# Patient Record
Sex: Female | Born: 1935
Health system: Southern US, Community
[De-identification: ages and names within clinical notes are randomized; demographics above are authoritative.]

## PROBLEM LIST (undated history)

## (undated) DIAGNOSIS — D649 Anemia, unspecified: Secondary | ICD-10-CM

## (undated) DIAGNOSIS — N289 Disorder of kidney and ureter, unspecified: Secondary | ICD-10-CM

## (undated) DIAGNOSIS — M1990 Unspecified osteoarthritis, unspecified site: Secondary | ICD-10-CM

## (undated) DIAGNOSIS — E785 Hyperlipidemia, unspecified: Secondary | ICD-10-CM

## (undated) DIAGNOSIS — I10 Essential (primary) hypertension: Secondary | ICD-10-CM

## (undated) DIAGNOSIS — M8580 Other specified disorders of bone density and structure, unspecified site: Secondary | ICD-10-CM

## (undated) HISTORY — DX: Hyperlipidemia, unspecified: E78.5

## (undated) HISTORY — DX: Essential (primary) hypertension: I10

## (undated) HISTORY — DX: Anemia, unspecified: D64.9

## (undated) HISTORY — DX: Disorder of kidney and ureter, unspecified: N28.9

## (undated) HISTORY — PX: CATARACT EXTRACTION: SUR2

## (undated) HISTORY — DX: Other specified disorders of bone density and structure, unspecified site: M85.80

---

## 2002-08-20 ENCOUNTER — Encounter: Payer: Self-pay | Admitting: Internal Medicine

## 2002-08-20 ENCOUNTER — Ambulatory Visit (HOSPITAL_COMMUNITY): Admission: RE | Admit: 2002-08-20 | Discharge: 2002-08-20 | Payer: Self-pay | Admitting: Internal Medicine

## 2003-10-19 ENCOUNTER — Inpatient Hospital Stay (HOSPITAL_COMMUNITY): Admission: RE | Admit: 2003-10-19 | Discharge: 2003-10-23 | Payer: Self-pay | Admitting: Orthopedic Surgery

## 2003-10-19 HISTORY — PX: KNEE SURGERY: SHX244

## 2004-11-09 ENCOUNTER — Ambulatory Visit: Payer: Self-pay | Admitting: Internal Medicine

## 2005-03-21 ENCOUNTER — Ambulatory Visit: Payer: Self-pay | Admitting: Internal Medicine

## 2005-05-24 ENCOUNTER — Ambulatory Visit: Payer: Self-pay | Admitting: Internal Medicine

## 2005-06-29 ENCOUNTER — Ambulatory Visit: Payer: Self-pay | Admitting: Family Medicine

## 2005-06-29 ENCOUNTER — Ambulatory Visit: Payer: Self-pay | Admitting: Internal Medicine

## 2006-04-04 ENCOUNTER — Ambulatory Visit: Payer: Self-pay | Admitting: Internal Medicine

## 2006-09-13 ENCOUNTER — Ambulatory Visit: Payer: Self-pay | Admitting: Internal Medicine

## 2006-10-16 ENCOUNTER — Ambulatory Visit: Payer: Self-pay | Admitting: Internal Medicine

## 2006-10-16 LAB — CONVERTED CEMR LAB
Folate: 16.2 ng/mL
Iron: 98 ug/dL (ref 42–145)
Saturation Ratios: 29.4 % (ref 20.0–50.0)
Transferrin: 238.2 mg/dL (ref >=212.0)
Vitamin B-12: 395 pg/mL (ref 211–911)

## 2007-04-09 ENCOUNTER — Ambulatory Visit: Payer: Self-pay | Admitting: Internal Medicine

## 2007-04-10 ENCOUNTER — Encounter: Admission: RE | Admit: 2007-04-10 | Discharge: 2007-04-10 | Payer: Self-pay | Admitting: Internal Medicine

## 2007-05-09 DIAGNOSIS — I10 Essential (primary) hypertension: Secondary | ICD-10-CM | POA: Insufficient documentation

## 2007-11-21 ENCOUNTER — Ambulatory Visit: Payer: Self-pay | Admitting: Internal Medicine

## 2008-02-13 ENCOUNTER — Encounter: Payer: Self-pay | Admitting: Internal Medicine

## 2008-02-13 ENCOUNTER — Ambulatory Visit: Payer: Self-pay | Admitting: Internal Medicine

## 2008-02-20 ENCOUNTER — Encounter (INDEPENDENT_AMBULATORY_CARE_PROVIDER_SITE_OTHER): Payer: Self-pay | Admitting: *Deleted

## 2008-02-24 ENCOUNTER — Encounter (INDEPENDENT_AMBULATORY_CARE_PROVIDER_SITE_OTHER): Payer: Self-pay | Admitting: *Deleted

## 2008-03-11 ENCOUNTER — Telehealth (INDEPENDENT_AMBULATORY_CARE_PROVIDER_SITE_OTHER): Payer: Self-pay | Admitting: *Deleted

## 2008-04-17 ENCOUNTER — Ambulatory Visit: Payer: Self-pay | Admitting: Internal Medicine

## 2008-04-17 LAB — CONVERTED CEMR LAB
ALT: 26 units/L (ref 0–35)
AST: 31 units/L (ref 0–37)
Albumin: 4 g/dL (ref 3.5–5.2)
Alkaline Phosphatase: 33 units/L — ABNORMAL LOW (ref 39–117)
BUN: 27 mg/dL — ABNORMAL HIGH (ref 6–23)
Basophils Absolute: 0.1 10*3/uL (ref 0.0–0.1)
Basophils Relative: 1.1 % — ABNORMAL HIGH (ref 0.0–1.0)
Bilirubin, Direct: 0.1 mg/dL (ref 0.0–0.3)
Cholesterol: 231 mg/dL (ref 0–200)
Creatinine, Ser: 1.4 mg/dL — ABNORMAL HIGH (ref 0.4–1.2)
Direct LDL: 141.4 mg/dL
Eosinophils Absolute: 0.2 10*3/uL (ref 0.0–0.7)
Eosinophils Relative: 3.2 % (ref 0.0–5.0)
HCT: 34.7 % — ABNORMAL LOW (ref 36.0–46.0)
HDL: 64 mg/dL (ref >39.0)
Hemoglobin: 11.8 g/dL — ABNORMAL LOW (ref 12.0–15.0)
Lymphocytes Relative: 22.5 % (ref 12.0–46.0)
MCHC: 33.9 g/dL (ref 30.0–36.0)
MCV: 94.6 fL (ref 78.0–100.0)
Monocytes Absolute: 0.5 10*3/uL (ref 0.1–1.0)
Monocytes Relative: 11.3 % (ref 3.0–12.0)
Neutro Abs: 2.8 10*3/uL (ref 1.4–7.7)
Neutrophils Relative %: 61.9 % (ref 43.0–77.0)
Platelets: 171 10*3/uL (ref 150–400)
Potassium: 4.2 meq/L (ref 3.5–5.1)
RBC: 3.67 M/uL — ABNORMAL LOW (ref 3.87–5.11)
RDW: 12.3 % (ref 11.5–14.6)
TSH: 1.01 microintl units/mL (ref 0.35–5.50)
Total Bilirubin: 0.8 mg/dL (ref 0.3–1.2)
Total CHOL/HDL Ratio: 3.6
Total Protein: 6.9 g/dL (ref 6.0–8.3)
Triglycerides: 85 mg/dL (ref 0–149)
VLDL: 17 mg/dL (ref 0–40)
Vit D, 1,25-Dihydroxy: 29 — ABNORMAL LOW (ref 30–89)
WBC: 4.7 10*3/uL (ref 4.5–10.5)

## 2008-04-24 ENCOUNTER — Encounter: Payer: Self-pay | Admitting: Internal Medicine

## 2008-04-27 ENCOUNTER — Encounter (INDEPENDENT_AMBULATORY_CARE_PROVIDER_SITE_OTHER): Payer: Self-pay | Admitting: *Deleted

## 2008-04-30 ENCOUNTER — Ambulatory Visit: Payer: Self-pay | Admitting: Internal Medicine

## 2008-04-30 DIAGNOSIS — E785 Hyperlipidemia, unspecified: Secondary | ICD-10-CM | POA: Insufficient documentation

## 2008-04-30 DIAGNOSIS — D6489 Other specified anemias: Secondary | ICD-10-CM | POA: Insufficient documentation

## 2008-04-30 DIAGNOSIS — E559 Vitamin D deficiency, unspecified: Secondary | ICD-10-CM | POA: Insufficient documentation

## 2008-04-30 LAB — CONVERTED CEMR LAB
Cholesterol, target level: 200 mg/dL
HDL goal, serum: 40 mg/dL
LDL Goal: 160 mg/dL

## 2009-05-24 ENCOUNTER — Telehealth (INDEPENDENT_AMBULATORY_CARE_PROVIDER_SITE_OTHER): Payer: Self-pay | Admitting: *Deleted

## 2009-05-25 ENCOUNTER — Telehealth (INDEPENDENT_AMBULATORY_CARE_PROVIDER_SITE_OTHER): Payer: Self-pay | Admitting: *Deleted

## 2009-09-21 ENCOUNTER — Ambulatory Visit: Payer: Self-pay | Admitting: Internal Medicine

## 2009-10-14 ENCOUNTER — Ambulatory Visit: Payer: Self-pay | Admitting: Internal Medicine

## 2009-10-14 DIAGNOSIS — M949 Disorder of cartilage, unspecified: Secondary | ICD-10-CM

## 2009-10-14 DIAGNOSIS — M899 Disorder of bone, unspecified: Secondary | ICD-10-CM | POA: Insufficient documentation

## 2009-10-29 ENCOUNTER — Ambulatory Visit: Payer: Self-pay | Admitting: Internal Medicine

## 2010-09-09 ENCOUNTER — Ambulatory Visit: Payer: Self-pay | Admitting: Internal Medicine

## 2010-11-07 ENCOUNTER — Telehealth (INDEPENDENT_AMBULATORY_CARE_PROVIDER_SITE_OTHER): Payer: Self-pay | Admitting: *Deleted

## 2011-01-15 LAB — CONVERTED CEMR LAB
ALT: 21 units/L (ref 0–35)
AST: 32 units/L (ref 0–37)
Albumin: 4.4 g/dL (ref 3.5–5.2)
Alkaline Phosphatase: 44 units/L (ref 39–117)
BUN: 24 mg/dL — ABNORMAL HIGH (ref 6–23)
Basophils Absolute: 0 10*3/uL (ref 0.0–0.1)
Basophils Relative: 0.9 % (ref 0.0–3.0)
Bilirubin, Direct: 0 mg/dL (ref 0.0–0.3)
CO2: 27 meq/L (ref 19–32)
Calcium: 9.4 mg/dL (ref 8.4–10.5)
Chloride: 101 meq/L (ref 96–112)
Creatinine, Ser: 1.4 mg/dL — ABNORMAL HIGH (ref 0.4–1.2)
Eosinophils Absolute: 0.1 10*3/uL (ref 0.0–0.7)
Eosinophils Relative: 2.7 % (ref 0.0–5.0)
Folate: 16.7 ng/mL
GFR calc non Af Amer: 38.72 mL/min (ref >60)
Glucose, Bld: 90 mg/dL (ref 70–99)
HCT: 36.5 % (ref 36.0–46.0)
Hemoglobin: 12.1 g/dL (ref 12.0–15.0)
Iron: 87 ug/dL (ref 42–145)
Lymphocytes Relative: 25.1 % (ref 12.0–46.0)
Lymphs Abs: 1.2 10*3/uL (ref 0.7–4.0)
MCHC: 33.1 g/dL (ref 30.0–36.0)
MCV: 99.2 fL (ref 78.0–100.0)
Monocytes Absolute: 0.5 10*3/uL (ref 0.1–1.0)
Monocytes Relative: 10 % (ref 3.0–12.0)
Neutro Abs: 2.9 10*3/uL (ref 1.4–7.7)
Neutrophils Relative %: 61.3 % (ref 43.0–77.0)
Platelets: 186 10*3/uL (ref 150.0–400.0)
Potassium: 4.3 meq/L (ref 3.5–5.1)
RBC: 3.67 M/uL — ABNORMAL LOW (ref 3.87–5.11)
RDW: 12.3 % (ref 11.5–14.6)
Saturation Ratios: 24.4 % (ref 20.0–50.0)
Sodium: 137 meq/L (ref 135–145)
TSH: 0.8 microintl units/mL (ref 0.35–5.50)
Total Bilirubin: 1.2 mg/dL (ref 0.3–1.2)
Total Protein: 7.6 g/dL (ref 6.0–8.3)
Transferrin: 254.9 mg/dL (ref 212.0–360.0)
Vit D, 25-Hydroxy: 35 ng/mL (ref 30–89)
Vitamin B-12: 392 pg/mL (ref 211–911)
WBC: 4.7 10*3/uL (ref 4.5–10.5)

## 2011-01-17 NOTE — Progress Notes (Signed)
Summary: refill  Phone Note Refill Request Message from:  Fax from Pharmacy on November 07, 2010 8:41 AM  Refills Requested: Medication #1:  ATENOLOL 25 MG TABS 1 by mouth once daily  Medication #2:  HYDROCHLOROTHIAZIDE 25 MG TABS 1/2 tab once daily [facility]  - fax [phone] - tel [phone]  Next Appointment Scheduled: none Initial call taken by: Okey Regal [place],  November 07, 2010 8:42 AM  Follow-up for Phone Call        Patient needs to schedule yearly CPX (30 min appointment). 90 day supply of both meds given to allow time for CPX appointment Follow-up by: Shonna Chock CMA,  November 07, 2010 2:32 PM    New/Updated Medications: HYDROCHLOROTHIAZIDE 25 MG TABS (HYDROCHLOROTHIAZIDE) 1/2 tab once daily **APPOINTMENT DUE** ATENOLOL 25 MG TABS (ATENOLOL) 1 by mouth once daily **APPOINTMENT DUE** Prescriptions: ATENOLOL 25 MG TABS (ATENOLOL) 1 by mouth once daily **APPOINTMENT DUE**  #90 x 0   Entered by:   Shonna Chock CMA   Authorized by:   Marga Melnick MD   Signed by:   Shonna Chock CMA on 11/07/2010   Method used:   Electronically to        [facility] [address] [facility] (retail)       [phone]       [place], [place]  [address]       Ph: [phone]       Fax: [phone]   RxID:   [id number] HYDROCHLOROTHIAZIDE 25 MG TABS (HYDROCHLOROTHIAZIDE) 1/2 tab once daily **APPOINTMENT DUE**  #45 x 0   Entered by:   Shonna Chock CMA   Authorized by:   Marga Melnick MD   Signed by:   Shonna Chock CMA on 11/07/2010   Method used:   Electronically to        [facility] [address] [place] (retail)       [phone]       [place], [place]  [address]       Ph: [phone]       Fax: [phone]   RxID:   [id number]

## 2011-01-17 NOTE — Assessment & Plan Note (Signed)
Summary: FLU SHOT//PH  Nurse Visit   Allergies: No Known Drug Allergies  Orders Added: 1)  Flu Vaccine 76yrs + MEDICARE PATIENTS [Q2039] 2)  Administration Flu vaccine - MCR [G0008] Flu Vaccine Consent Questions     Do you have a history of severe allergic reactions to this vaccine? no    Any prior history of allergic reactions to egg and/or gelatin? no    Do you have a sensitivity to the preservative Thimersol? no    Do you have a past history of Guillan-Barre Syndrome? no    Do you currently have an acute febrile illness? no    Have you ever had a severe reaction to latex? no    Vaccine information given and explained to patient? yes    Are you currently pregnant? no    Lot Number:[id number]   Exp Date:06/17/2011   Site Given  right Deltoid IM

## 2011-02-21 ENCOUNTER — Encounter: Payer: Self-pay | Admitting: Internal Medicine

## 2011-02-27 ENCOUNTER — Encounter: Payer: Self-pay | Admitting: Internal Medicine

## 2011-02-27 ENCOUNTER — Other Ambulatory Visit: Payer: Self-pay | Admitting: Internal Medicine

## 2011-02-27 ENCOUNTER — Ambulatory Visit (INDEPENDENT_AMBULATORY_CARE_PROVIDER_SITE_OTHER): Payer: Medicare Other | Admitting: Internal Medicine

## 2011-02-27 DIAGNOSIS — Z23 Encounter for immunization: Secondary | ICD-10-CM

## 2011-02-27 DIAGNOSIS — IMO0002 Reserved for concepts with insufficient information to code with codable children: Secondary | ICD-10-CM

## 2011-02-27 DIAGNOSIS — I10 Essential (primary) hypertension: Secondary | ICD-10-CM

## 2011-02-27 DIAGNOSIS — L259 Unspecified contact dermatitis, unspecified cause: Secondary | ICD-10-CM

## 2011-02-28 LAB — POTASSIUM: Potassium: 4.9 mEq/L (ref 3.5–5.1)

## 2011-02-28 LAB — CBC WITH DIFFERENTIAL/PLATELET
Basophils Absolute: 0 10*3/uL (ref 0.0–0.1)
Basophils Relative: 0.3 % (ref 0.0–3.0)
Eosinophils Absolute: 0.1 10*3/uL (ref 0.0–0.7)
Eosinophils Relative: 1.4 % (ref 0.0–5.0)
HCT: 32.1 % — ABNORMAL LOW (ref 36.0–46.0)
Hemoglobin: 11.1 g/dL — ABNORMAL LOW (ref 12.0–15.0)
Lymphocytes Relative: 23.4 % (ref 12.0–46.0)
Lymphs Abs: 1.3 10*3/uL (ref 0.7–4.0)
MCHC: 34.5 g/dL (ref 30.0–36.0)
MCV: 97 fl (ref 78.0–100.0)
Monocytes Absolute: 0.7 10*3/uL (ref 0.1–1.0)
Monocytes Relative: 12.9 % — ABNORMAL HIGH (ref 3.0–12.0)
Neutro Abs: 3.4 10*3/uL (ref 1.4–7.7)
Neutrophils Relative %: 62 % (ref 43.0–77.0)
Platelets: 161 10*3/uL (ref 150.0–400.0)
RBC: 3.31 Mil/uL — ABNORMAL LOW (ref 3.87–5.11)
RDW: 13 % (ref 11.5–14.6)
WBC: 5.5 10*3/uL (ref 4.5–10.5)

## 2011-02-28 LAB — BUN: BUN: 43 mg/dL — ABNORMAL HIGH (ref 6–23)

## 2011-02-28 LAB — CREATININE, SERUM: Creatinine, Ser: 1.7 mg/dL — ABNORMAL HIGH (ref 0.4–1.2)

## 2011-03-01 ENCOUNTER — Telehealth (INDEPENDENT_AMBULATORY_CARE_PROVIDER_SITE_OTHER): Payer: Self-pay | Admitting: *Deleted

## 2011-03-07 NOTE — Assessment & Plan Note (Signed)
Summary: sore on leg/cbs   Vital Signs:  Patient profile:   77 year old female Weight:      136.2 pounds BMI:     25.20 Temp:     98.6 degrees F oral Pulse rate:   76 / minute Resp:     15 per minute BP sitting:   128 / 72  (left arm) Cuff size:   regular  Vitals Entered By: Shonna Chock CMA (February 27, 2011 2:58 PM) CC: Sore on right leg since Wed: patient cut leg on the corner of dishwasher, Rash   CC:  Sore on right leg since Wed: patient cut leg on the corner of dishwasher and Rash.  History of Present Illness:    Injury to R shin 02/22/2011 ; Rx: spray antiseptic . After Neopsporin was used redness started 03/09.She  reports redness, increased warmth, and tenderness, but denies purulence or  pustules.  The patient denies the following symptoms: fever, chills or sweats . No red streaks up leg.    Current Medications (verified): 1)  Hydrochlorothiazide 25 Mg Tabs (Hydrochlorothiazide) .... 1/2 Tab Once Daily **appointment Due** 2)  Lovastatin 40 Mg Tabs (Lovastatin) .... Take One Tablet At Bedtime 3)  Atenolol 25 Mg Tabs (Atenolol) .Marland Kitchen.. 1 By Mouth Once Daily **appointment Due** 4)  Calcium 100mg  .... 1-2 By Mouth Once Daily 5)  Vit D 1000iu .Marland Kitchen.. 1 By Mouth Once Daily 6)  Occuvite .Marland Kitchen.. 1 By Mouth Once Daily  Allergies (verified): No Known Drug Allergies  Physical Exam  General:  well-nourished,in no acute distress; alert,appropriate and cooperative throughout examination Pulses:  R and L dorsalis pedis and posterior tibial pulses are full and equal bilaterally. VV of feet Extremities:  Neg Homan's sign RLE Skin:  5.5 X 7.5 cm erythema w/o increased temp; 4.5X 1 cm eschar    Impression & Recommendations:  Problem # 1:  CONTACT DERMATITIS (ICD-692.9) from Neosporin  Problem # 2:  ABRASION, LEG (ICD-916.0)  Orders: Prescription Created Electronically (N0272) Venipuncture (53664) TLB-CBC Platelet - w/Differential (85025-CBCD)  Problem # 3:  HYPERTENSION  (ICD-401.9) controlled Her updated medication list for this problem includes:    Hydrochlorothiazide 25 Mg Tabs (Hydrochlorothiazide) .Marland Kitchen... 1/2 tab once daily    Atenolol 25 Mg Tabs (Atenolol) .Marland Kitchen... 1 by mouth once daily  Orders: Venipuncture (40347) TLB-Creatinine, Blood (82565-CREA) TLB-Potassium (K+) (84132-K) TLB-BUN (Urea Nitrogen) (84520-BUN)  Complete Medication List: 1)  Hydrochlorothiazide 25 Mg Tabs (Hydrochlorothiazide) .... 1/2 tab once daily 2)  Lovastatin 40 Mg Tabs (Lovastatin) .... Take one tablet at bedtime 3)  Atenolol 25 Mg Tabs (Atenolol) .Marland Kitchen.. 1 by mouth once daily 4)  Calcium 100mg   .... 1-2 by mouth once daily 5)  Vit D 1000iu  .Marland Kitchen.. 1 by mouth once daily 6)  Occuvite  .Marland KitchenMarland Kitchen. 1 by mouth once daily 7)  Tramadol Hcl 50 Mg Tabs (Tramadol hcl) .Marland Kitchen.. 1 every 6 hrs as needed  Other Orders: Tdap => 30yrs IM (42595) Admin 1st Vaccine (63875)  Patient Instructions: 1)  Report pain , pus ,fever & red streaks up leg. Cleanse wound carefully once daily & cover with Telfa. Stop Neosporin. 2)  Check your Blood Pressure regularly. If it is above: 135/85 ON AVERAGE  you should make an appointment. Prescriptions: TRAMADOL HCL 50 MG TABS (TRAMADOL HCL) 1 every 6 hrs as needed  #30 x 0   Entered and Authorized by:   Marga Melnick MD   Signed by:   Marga Melnick MD on 02/27/2011   Method used:  Electronically to        [facility] [address] [place] (retail)       [phone]       [place], [place]  [address]       Ph: [phone]       Fax: [phone]   RxID:   [id number] ATENOLOL 25 MG TABS (ATENOLOL) 1 by mouth once daily  #90 x 1   Entered and Authorized by:   Marga Melnick MD   Signed by:   Marga Melnick MD on 02/27/2011   Method used:   Electronically to        [facility] [address] [place] (retail)       [phone]       [place], [place]  [address]       Ph: [phone]       Fax: [phone]    RxID:   [id number] HYDROCHLOROTHIAZIDE 25 MG TABS (HYDROCHLOROTHIAZIDE) 1/2 tab once daily  #90 x 0   Entered and Authorized by:   Marga Melnick MD   Signed by:   Marga Melnick MD on 02/27/2011   Method used:   Electronically to        [facility] [address] [place] (retail)       [phone]       [place], [place]  [address]       Ph: [phone]       Fax: [phone]   RxID:   [id number]    Orders Added: 1)  Tdap => 57yrs IM [90715] 2)  Admin 1st Vaccine [90471] 3)  Est. Patient Level III [62694] 4)  Prescription Created Electronically [G8553] 5)  Venipuncture [85462] 6)  TLB-CBC Platelet - w/Differential [85025-CBCD] 7)  TLB-Creatinine, Blood [82565-CREA] 8)  TLB-Potassium (K+) [84132-K] 9)  TLB-BUN (Urea Nitrogen) [84520-BUN]   Immunizations Administered:  Tetanus Vaccine:    Vaccine Type: Tdap    Site: right deltoid    Mfr: GlaxoSmithKline    Dose: 0.5 ml    Route: IM    Given by: Shonna Chock CMA    Exp. Date: 11/10/2012    Lot #: [id number]   Immunizations Administered:  Tetanus Vaccine:    Vaccine Type: Tdap    Site: right deltoid    Mfr: GlaxoSmithKline    Dose: 0.5 ml    Route: IM    Given by: Shonna Chock CMA    Exp. Date: 11/10/2012    Lot #: [id number]   Appended Document: sore on leg/cbs

## 2011-03-07 NOTE — Progress Notes (Signed)
Summary: Lab Results/ Ongoing Redness(leg)  Phone Note Outgoing Call Call back at [facility] Phone [phone]   Call placed by: Shonna Chock CMA,  March 01, 2011 9:23 AM Call placed to: Patient Summary of Call: Spoke with patient:  No infection suggested ,but anemia is present .This is probably due to progression of kidney impairment. Please see me before refilling meds;all will need to be changed. Hopp  patient then mentioned redness is about the same as when she was here [name]), if there is any recommendations or suggestions patient would like to know, patient will be leaving town this weekend./[facility] CMA  March 01, 2011 9:25 AM   Follow-up for Phone Call         prescriptions for generic Augmentin 500 mg every 12 hours with food dispense 14 ; generic Bactroban ointment 15 g. Apply twice a day after cleaning to wound Follow-up by: Marga Melnick MD,  March 01, 2011 11:34 AM  Additional Follow-up for Phone Call Additional follow up Details #1::        Spoke w/ patient aware prescription sent to pharmacy ........Marland KitchenDoristine Devoid CMA  March 01, 2011 12:37 PM     New/Updated Medications: AUGMENTIN 500-125 MG TABS (AMOXICILLIN-POT CLAVULANATE) take one tablet two times a day Prescriptions: AUGMENTIN 500-125 MG TABS (AMOXICILLIN-POT CLAVULANATE) take one tablet two times a day  #14 x 0   Entered by:   Doristine Devoid CMA   Authorized by:   Marga Melnick MD   Signed by:   Doristine Devoid CMA on 03/01/2011   Method used:   Electronically to        [facility] (retail)       [phone]       [place], [place]  [address]       Ph: [phone]       Fax: [phone]   RxID:   [id number]

## 2011-03-07 NOTE — Progress Notes (Signed)
       New/Updated Medications: BACTROBAN 2 % OINT (MUPIROCIN) apply two times a day to affected area Prescriptions: BACTROBAN 2 % OINT (MUPIROCIN) apply two times a day to affected area  #15g x 0   Entered by:   Doristine Devoid CMA   Authorized by:   Marga Melnick MD   Signed by:   Doristine Devoid CMA on 03/01/2011   Method used:   Electronically to        [facility] (retail)       [phone]       [place], [place]  [address]       Ph: [phone]       Fax: [phone]   RxID:   [id number]

## 2011-04-06 ENCOUNTER — Encounter: Payer: Self-pay | Admitting: Internal Medicine

## 2011-05-05 NOTE — Discharge Summary (Signed)
NAME:  Charlene Garza, Charlene Garza                        ACCOUNT NO.:  [id number]   MEDICAL RECORD NO.:  [id number]                   PATIENT TYPE:  INP   LOCATION:  0476                                 FACILITY:  [facility]   PHYSICIAN:  Ollen Gross, M.D.                 DATE OF BIRTH:  1930-10-03   DATE OF ADMISSION:  10/19/2003  DATE OF DISCHARGE:  10/23/2003                                 DISCHARGE SUMMARY   ADMISSION DIAGNOSES:  1. Osteoarthritis, bilateral knees, left more symptomatic than right.  2. Hypertension.  3. Hypercholesterolemia.   DISCHARGE DIAGNOSES:  1. Osteoarthritis, left knee, status post left total knee arthroplasty.  2. Hypertension.  3. Hypercholesterolemia.  4. Postoperative blood loss anemia.  5. Postoperative hyponatremia, improved.   PROCEDURE:  On October 19, 2003, left total knee arthroplasty.  Surgeon was  Dr. Lequita Halt, assistant was Avel Peace, P.A.-C.  Anesthesia was general.  Minimal blood loss.  Hemovac drain x1.  Tourniquet time 42 minutes at 300  mmHg.   HOSPITAL COURSE:  Charlene Garza is a 77 year old female who has end-stage  osteoarthritis of the left knee which has been refractory to non-operative  management including injections.  She now presents for a left total knee  arthroplasty.   CONSULTATIONS:  None.   LABORATORY DATA:  CBC on admission showed a hemoglobin of 11.9, hematocrit  34.2, white cell count 7, red cell count 3.69, differential all within  normal limits.  Postoperative H&H were 8.6 and 24.9.  Last noted H&H were  8.2 and 23.3.  PT and PTT on admission were 12.7 and 37, respectively, with  an INR of 0.9.  Serial prothrombin times were followed per Coumadin  protocol.  Last noted PT/INR were 17.2 and 1.6.  Chem panel on admission  showed an elevated BUN of 35, remaining chem panel all within normal limits.  Serial BMETs were followed.  Sodium did drop from 149 to 128, back up to  132.  BUN started out high at 35, back down to 18,  back up to 29.  Creatinine went up from 1.3, to 1.8, back down to 1.2.  Calcium dropped from  9.7 to 8.3.  Urinalysis did show moderate blood preoperatively with positive  nitrite, moderate leukocyte esterase, 21 to 50 white cells, with a few  bacteria.  She was treated preoperatively for this.   Chest x-ray dated October 14, 2003, no active disease.  I do not see an EKG  on this chart.   HOSPITAL COURSE:  The patient was admitted to [facility], taken to  the OR, underwent the above stated procedure without complication.  The  patient tolerated the procedure well, later transferred to the recovery room  and then to the orthopaedic floor for continued postoperative care.  Vital  signs were followed, given 24 hours of postoperative IV antibiotics, placed  on Coumadin for DVT prophylaxis.  PT and OT consulted.  Weightbearing as  tolerated to the left lower extremity.  Hemovac drain placed at the time of  surgery was pulled on postoperative day #1.  She did have a little bit of  positive fluid balance.  Felt she had __________ to her anemia.  She  underwent some mild diuresis.  The I&O did improve.  She was noted to have a  low hemoglobin.  She was monitored for symptoms.  She had a little bit of  lightheadedness.  There was a question of whether she would need blood or  not.  Dressing changes initiated on postoperative day #2, incision was  healing well.  The Marcaine pain pump was removed on postoperative day #2,  which was placed at the time of surgery.  By day #3, she was having a little  bit of nausea, the lightheadedness had improved, her medications were  changed over to [facility].  The creatinine which had bumped up a little bit  was back down by day #3.  From a therapy standpoint, she started out a  little slow, only ambulating approximately 10 feet by postoperative day #2,  and then 25 feet by day #3.  She was up to 80 feet by day #4.  She continued  to progress with her  therapy in the second half of the course, and was doing  much better by postoperative day #4.  She was seen in rounds by Dr. Lequita Halt.  Her hemoglobin had stabilized at 8.2, later it started to come up.  Her  elevated creatinine had improved.  Her hyponatremia which was noted  postoperatively did improve.  She was meeting her goals for therapy and it  was decided that she could be discharged home.   DISCHARGE PLAN:  The patient is discharged home on October 23, 2003.   DISCHARGE DIAGNOSES:  Please see above.   DISCHARGE MEDICATIONS:  1. Trinsicon.  2. Coumadin.  3. Robaxin.  4. Mepergan Fortis.   DIET:  Low sodium, low cholesterol diet.   FOLLOWUP:  In two weeks.   ACTIVITY:  Weightbearing as tolerated.  Home health PT and home health  nursing.  Total knee protocol.   DISPOSITION:  Home.   CONDITION ON DISCHARGE:  Improved.     Alexzandrew L. Julien Girt, P.A.              Ollen Gross, M.D.    ALP/MEDQ  D:  11/20/2003  T:  11/20/2003  Job:  [id number]   cc:   Titus Dubin. Alwyn Ren, M.D. [facility]

## 2011-05-05 NOTE — Assessment & Plan Note (Signed)
[facility]                        [place] OFFICE NOTE   NAME:Vuolo, VICKKI IGOU                     MRN:          [id number]  DATE:04/09/2007                            DOB:          [birth year]    Evaluation of trauma to the left lower extremity.   While in [place] on April 12, a loaded suitcase fell from the car onto  her shin.  She turned in an attempt to avoid the strike and had some  additional trauma to the calf as well.  That night she iced the leg, but  had to drive back to [facility] over April 13 and April 14.  By the  evening of April 14, she began to notice ecchymosis over the calf.  The  swelling and ecchymosis seemed to be improving but over the last 2-3  days she has had persistent edema of the leg with dependent ecchymotic  changes in the foot and ankle.   She denies chest pain or shortness of breath.  She has had no fever,  chills, or sweats.  She has pain which is worse after sitting and she  has pain to palpation.   PAST MEDICAL HISTORY:  Three pregnancies and one delivery.  She had knee  surgery on the left; she had no perioperative complications.  There is  no personal or family history of deep venous thrombosis, pulmonary  embolism, or clotting disorders.   MEDICATIONS:  1. Atenolol.  2. Hydrochlorothiazide.  3. Lovastatin.  4. Citracal.   PHYSICAL EXAMINATION:  VITAL SIGNS:  Weight 158.9, pulse 60, respiratory  rate 14, and blood pressure 130/88.  She has no lymphadenopathy.  CHEST:  Clear to auscultation; she has no increased work of breathing.  HEART:  Rhythm is regular.  EXTREMITIES:  Posterior tibial pulses are decreased, dorsalis pedis  pulses are present.  She has marked varicosities over the right lower  extremity.  There is a large hematoma over the left shin with  obvious  swelling of the calf and dorsum of the foot.  There was ecchymosis at  the ankle on the left as well.  With Homan's maneuver,  there is  discomfort only at the Achilles tendon.  There is pain to palpation over  the shin on the left.   MRI will be scheduled of the left lower extremity to assess soft tissue  compartmental injury as well as bony injury.  She will report any fever,  chills, sweats, or cardiopulmonary symptoms.  She should avoid using ice at this point and use elevation, warm  compresses,& Aleve two every  8 hours with food for 4-5 days, pending results of the MRI.  Referral to  an orthopedist will depend on those findings.     Titus Dubin. Alwyn Ren, MD,FACP,FCCP  Electronically Signed    [facility]/Med[facility]  DD: 04/09/2007  DT: 04/09/2007  Job #: [id number]

## 2011-05-05 NOTE — H&P (Signed)
NAME:  Charlene Garza, Charlene Garza                        ACCOUNT NO.:  [id number]   MEDICAL RECORD NO.:  [id number]                   PATIENT TYPE:  INP   LOCATION:  0476                                 FACILITY:  [facility]   PHYSICIAN:  Ollen Gross, M.D.                 DATE OF BIRTH:  December 21, 1929   DATE OF ADMISSION:  10/19/2003  DATE OF DISCHARGE:                                HISTORY & PHYSICAL   CHIEF COMPLAINT:  Left knee pain.   HISTORY OF PRESENT ILLNESS:  The patient is a 77 year old female with left  greater than right knee pain.  She has been seen and evaluated by Dr. Ollen Gross for a long history of progressive knee pain that has been ongoing  for several years now, but has gotten worse over the past several months.  She is quite active and it started to interfere with her daily activities.  She does have some discomfort at night, but it is worse during the day when  she is up moving around.  She has had injections in the past, including  Hyalgan which did not give much benefit.  She was seen in the office.  X-  rays demonstrated bone-on-bone tricompartmental changes.  It is felt at this  point she has already reached a point where she could benefit from  undergoing a total knee arthroplasty.  Risks and benefits of the procedure  have been discussed with the patient and she elected to proceed with  surgery.   ALLERGIES:  No known drug allergies.   CURRENT MEDICATIONS:  1. Atenolol 25 mg 1/2 to one tablet daily.  2. Hydrochlorot 25 mg 1/2 tablet daily.  3. Zocor 40 mg daily.  4. Gluten 6 mg daily.   PAST MEDICAL HISTORY:  1. Hypercholesterolemia.  2. Hypertension.   PAST SURGICAL HISTORY:  Cataract surgery.   SOCIAL HISTORY:  Married, retired, nonsmoker, one to two glasses of wine  intake daily.  Two children.  Husband will be assisting with her care after  surgery.  She does have three floors, including her basement.  She does have  16 steps in her house.   FAMILY  HISTORY:  Mother deceased age 72 with a history of heart disease and  also arthritis.   REVIEW OF SYSTEMS:  GENERAL:  No fevers, chills, night sweats.  NEUROLOGIC:  No seizures, syncope, paralysis.  RESPIRATORY:  No shortness of breath,  productive cough, or hemoptysis.  CARDIOVASCULAR:  No chest pain, angina, or  orthopnea.  GASTROINTESTINAL:  No nausea, vomiting, diarrhea, or  constipation.  GENITOURINARY:  No dysuria, hematuria, or discharge.  MUSCULOSKELETAL:  Pertinent to that of the knee found in the history of  present illness.   PHYSICAL EXAMINATION:  VITAL SIGNS:  Pulse 62, respirations 12, blood  pressure 120/82.  GENERAL:  The patient is a 77 year old white female, well-developed, well-  nourished, in no acute distress.  She is alert, oriented, and cooperative.  HEENT:  Normocephalic, atraumatic.  Pupils are round and reactive.  Oropharynx is clear.  NECK:  Supple.  CHEST:  Clear to auscultation anterior and posterior chest walls.  No  wheezes, rhonchi, or rales.  HEART:  Regular rate and rhythm.  No murmurs appreciated.  ABDOMEN:  Soft, nontender, bowel sounds are present.  RECTAL:  Not done, not pertinent to present illness.,.  BREASTS:  Not done, not pertinent to present illness..  GENITALIA:  Not done, not pertinent to present illness.  EXTREMITIES:  Significant to that of the left knee.  She does have a slight  varus malalignment.  Range of motion of 5 to 115 degrees with marked  crepitus noted.  No instability.  I see no effusion.  The right knee shows  range of motion of 5 to 120 degrees, slight varus malalignment, no effusion,  no instability.   IMPRESSION:  1. Osteoarthritis, bilateral knees, left more symptomatic than right.  2. Hypertension.  3. Hypercholesterolemia.   PLAN:  The patient will be admitted to [facility] to undergo a  left total knee arthroplasty.  Surgery is to be performed by Dr. Ollen Gross.  Dr. Alwyn Ren is the patient's medical  physician and he will be  notified of the room number on admission and will be consulted if needed for  any medical assistance with this patient throughout the hospital course.     Alexzandrew L. Julien Girt, P.A.              Ollen Gross, M.D.    ALP/MEDQ  D:  10/21/2003  T:  10/21/2003  Job:  [id number]   cc:   Titus Dubin. Alwyn Ren, M.D. [facility]

## 2011-05-05 NOTE — Op Note (Signed)
NAME:  Charlene Garza, Charlene Garza                        ACCOUNT NO.:  0   MEDICAL RECORD NO.:  [id number]                   PATIENT TYPE:  INP   LOCATION:  0001                                 FACILITY:  [facility]   PHYSICIAN:  Ollen Gross, M.D.                 DATE OF BIRTH:  1930-11-12   DATE OF PROCEDURE:  10/19/2003  DATE OF DISCHARGE:                                 OPERATIVE REPORT   PREOPERATIVE DIAGNOSIS:  Osteoarthritis left knee.   POSTOPERATIVE DIAGNOSIS:  Osteoarthritis left knee.   PROCEDURE:  Left  total knee arthroplasty.   SURGEON:  Ollen Gross, M.D.   ASSISTANT:  Alexzandrew L. Julien Girt, P.A.   ANESTHESIA:  General.   ESTIMATED BLOOD LOSS:  Minimal.   DRAINS:  Hemovac x1.   COMPLICATIONS:  None, condition stable to recovery.   TOURNIQUET TIME:  42 minutes at 300 mmHg.   INDICATIONS FOR PROCEDURE:  Charlene Garza is a 77 year old female who has end-  stage osteoarthritis of the left knee with pain refractory to nonoperative  management including  injections. She presents now for  left  total knee  arthroplasty.   DESCRIPTION OF PROCEDURE:  After successful administration of general  anesthetic, the tourniquet was placed high on the left thigh and the left  lower extremity was prepped and draped in the usual sterile fashion. The  extremity was wrapped in an Esmarch  and the tourniquet was inflated to 300  mmHg.   A midline incision was made with a 10 blade through the subcutaneous tissue  to the level  of the extensor mechanism. A fresh blade was used to make a  medial peripatellar arthrotomy. Then the soft tissue over the proximal  medial tibia was subperiosteally elevated to the joint line with a knife and  in the semimembranous bursa with a curved osteotome. The soft tissue of the  proximal lateral  tibia was also elevated with attention being paid to avoid  the patellar tendon on the tibial tubercle.   The patella was everted, the knee was flexed 90 degrees  and the ACL and PCL  were removed. The drill was then used to create a starting hole in the  distal  femur. The canal was irrigated. A 5 degree left valgus alignment  guide was placed and referencing of the posterior  condyle instrumentation  was marked and the block entered 10 mm off the distal  femur. The distal  femoral resection was made with an oscillating saw. The sizing block was  placed and the size 3 was most appropriate. The rotation was marked off the  epicondylar axis. The size 3 cutting block is placed and the anterior and  posterior  cuts were made.   The tibia was subluxed forward and the menisci were removed. The  extramedullary tibial alignment guide was placed with reference proximally  at the medial aspect of the tibial tubercle and distally  along the 2nd  metatarsal axis and tibial depressed. The block is pinned to remove 10 mm up  and down the patient's lateral side. The tibial resection was made with an  oscillating saw. The size 3 is the most appropriate tibial component, then  the proximal tibia is prepared with a modular drill and keel punch for a  size 3. The femoral preparation is then completed with the intercondylar and  chamfer cuts.   The size 3 mobile bearing tibia was trial sized through a posterior  stabilized femoral trial and a 10-mm posterior stabilizer rotating platform  insert trial placed. With the 10, it was time to do varus valgus play with  full extension, thus we went to a 12.5. Full extension was still  achieved  with excellent balance throughout the full range of motion. We then everted  the patella. The thickness was measured to be 21 mm for ______ resection,  taking 11 mm. A 38 template is placed. Plug holes are drilled. A trial  patella is placed and it tracks normally.   Then osteophytes were removed at the posterior femur with the trial in  place. All trials were removed and the cut bone surfaces are prepared with  pulsatile lavage.  The cement was mixed and once ready for implantation a  size 3 hole bearing tibial tray size 3 posterior stabilized femur and a 38  patella was cemented into place. The patella was held with a clamp. A trial  12.5 insert was placed and the knee was held in full extension. All excess  cement was removed.   Once the cement was fully hardened and a permanent 12.5-mm posterior  stabilized rotating platform insert was placed into the tibial tray. The  wound was then copiously irrigated with antibiotic solution and the extensor  mechanism was closed over a Hemovac drain with interrupted #1 PDS. Total  tourniquet time was 42 minutes.   The tourniquet was released and flexion against gravity is 140 degrees. The  subcu was closed with interrupted 2-0 Vicryl, the subcuticular with running  4-0 Monocryl. The catheter for the Marcaine pain pump is placed and the pump  initiated. Steri-Strips and a bulky sterile dressing were applied on the  incision and she is placed into a knee immobilizer, awakened and transported  to recovery in stable condition.                                               Ollen Gross, M.D.    FA/MEDQ  D:  10/19/2003  T:  10/19/2003  Job:  [id number]

## 2011-06-22 ENCOUNTER — Encounter: Payer: Self-pay | Admitting: Internal Medicine

## 2011-06-29 ENCOUNTER — Encounter: Payer: Self-pay | Admitting: Internal Medicine

## 2011-07-14 ENCOUNTER — Other Ambulatory Visit: Payer: Self-pay | Admitting: Internal Medicine

## 2011-09-05 ENCOUNTER — Encounter: Payer: Self-pay | Admitting: Internal Medicine

## 2011-10-03 ENCOUNTER — Ambulatory Visit (INDEPENDENT_AMBULATORY_CARE_PROVIDER_SITE_OTHER): Payer: Medicare Other

## 2011-10-03 ENCOUNTER — Telehealth: Payer: Self-pay

## 2011-10-03 DIAGNOSIS — Z23 Encounter for immunization: Secondary | ICD-10-CM

## 2011-10-03 NOTE — Telephone Encounter (Signed)
Left message to notify pt that she and her husband, Chrissie Noa, both are due for a shingles vaccine if they so desire. Advised to call with questions or concerns or to schedule their nurse visit for the inj

## 2011-10-19 ENCOUNTER — Other Ambulatory Visit: Payer: Self-pay | Admitting: Internal Medicine

## 2011-10-19 NOTE — Telephone Encounter (Signed)
Pt overdue for OV-canceled last [4] visits - has appt scheduled for 10/25/11 - gave 15-day supply.

## 2011-10-25 ENCOUNTER — Encounter: Payer: Self-pay | Admitting: Internal Medicine

## 2012-01-04 ENCOUNTER — Encounter: Payer: Self-pay | Admitting: Internal Medicine

## 2012-02-07 ENCOUNTER — Ambulatory Visit: Payer: Medicare Other

## 2012-02-14 ENCOUNTER — Ambulatory Visit (INDEPENDENT_AMBULATORY_CARE_PROVIDER_SITE_OTHER): Payer: Medicare Other | Admitting: *Deleted

## 2012-02-14 DIAGNOSIS — Z0000 Encounter for general adult medical examination without abnormal findings: Secondary | ICD-10-CM

## 2012-02-14 DIAGNOSIS — Z23 Encounter for immunization: Secondary | ICD-10-CM

## 2012-02-26 ENCOUNTER — Telehealth: Payer: Self-pay | Admitting: Internal Medicine

## 2012-02-26 NOTE — Telephone Encounter (Signed)
correct 

## 2012-02-26 NOTE — Telephone Encounter (Signed)
Patient is scheduled for pneumonia and hep b shots. She does not want to have them if they are not necessary. I instructed patient I would call her if either or both of these injection appts are to be canceled.

## 2012-02-26 NOTE — Telephone Encounter (Signed)
1.) Pneumonia vaccine to be given at least once at or after age 78 2.)  Hep B vaccine recommended if traveling, medical personnel, have house hold contact with persons with Hep B, patient with end stage kidney disease, HIV or chronic liver disease   Dr.Hopper please advise further or do you agree with all above info

## 2012-02-27 NOTE — Telephone Encounter (Signed)
Patient aware of recommendations. Patient states she had pneumonia vaccine since age 78. Patient cancelled appointment for injections

## 2012-03-05 ENCOUNTER — Telehealth: Payer: Self-pay | Admitting: Internal Medicine

## 2012-03-05 NOTE — Telephone Encounter (Signed)
I called and left message on voicemail informing patient if she prefers to be seen tomorrow instead of Thursday to call and we will open a blocked slot for her

## 2012-03-05 NOTE — Telephone Encounter (Signed)
Patient has an appointment 03/07/12--[facility]  [facility] Triage Call Report Triage Record Num: [id number] Operator: Tomasita Crumble Patient Name: Charlene Garza [facility] #[phone]) Earlene Plater Call Date & Time: 03/05/2012 1:31:38PM Patient Phone: [phone] PCP: Marga Melnick Patient Gender: Female PCP Fax : [phone] Patient DOB: 05/05/1930 Practice Name: [facility] Reason for Call: Caller: Charlene Garza (alt #[phone])/Patient; PCP: Marga Melnick; CB#: [phone]; Call regarding BP High, 180/80; Can T Be Seen Until Thurs; 185/85 on 03/01/12. Most recent BP is 159/71. Emergent sx ruled out. Home care for the interim and parameters for callback given. Advised see in 72 hours per HTN protocol. Transferred to [place] at office for appointment. Protocol(s) Used: Hypertension, Diagnosed or Suspected Recommended Outcome per Protocol: See Provider within 72 Hours Reason for Outcome: Multiple elevated blood pressure readings without other symptoms AND no previous work-up OR readings exceed expected range defined by treatment plan Care Advice: Call EMS 911 if new symptoms develop, such as severe shortness of breath, chest pain, change in mental status, acute neurologic deficit, seizure, visual disturbances, pulse rate > 120 / minute, or very irregular pulse. ~ It is important to have blood pressure checked at least annually, or at each provider visit, especially if you have a history of high blood pressure in your family. ~ Medication Advice: - Discontinue all nonprescription and alternative medications, especially stimulants, until evaluated by provider. - Take prescribed medications as directed, following label instructions for the medication. - Do not change medications or dosing regimen until provider is consulted. - Know possible side effects of medication and what to do if they occur. - Tell provider all prescription, nonprescription or alternative medications that you take ~

## 2012-03-07 ENCOUNTER — Ambulatory Visit (INDEPENDENT_AMBULATORY_CARE_PROVIDER_SITE_OTHER): Payer: Medicare Other | Admitting: Internal Medicine

## 2012-03-07 ENCOUNTER — Encounter: Payer: Self-pay | Admitting: Internal Medicine

## 2012-03-07 VITALS — BP 144/90 | HR 59 | Temp 97.7°F | Wt 125.2 lb

## 2012-03-07 DIAGNOSIS — I10 Essential (primary) hypertension: Secondary | ICD-10-CM

## 2012-03-07 DIAGNOSIS — N289 Disorder of kidney and ureter, unspecified: Secondary | ICD-10-CM

## 2012-03-07 LAB — BASIC METABOLIC PANEL
BUN: 37 mg/dL — ABNORMAL HIGH (ref 6–23)
CO2: 28 mEq/L (ref 19–32)
Calcium: 9.7 mg/dL (ref 8.4–10.5)
Chloride: 98 mEq/L (ref 96–112)
Creatinine, Ser: 1.4 mg/dL — ABNORMAL HIGH (ref 0.4–1.2)
GFR: 39.12 mL/min — ABNORMAL LOW (ref >60.00)
Glucose, Bld: 73 mg/dL (ref 70–99)
Potassium: 4.6 mEq/L (ref 3.5–5.1)
Sodium: 136 mEq/L (ref 135–145)

## 2012-03-07 MED ORDER — ATENOLOL 25 MG PO TABS: 25.0000 mg | ORAL_TABLET | Freq: Two times a day (BID) | ORAL | Status: DC

## 2012-03-07 MED ORDER — RAMIPRIL 2.5 MG PO CAPS: 2.5000 mg | ORAL_CAPSULE | Freq: Every day | ORAL | Status: DC

## 2012-03-07 NOTE — Assessment & Plan Note (Signed)
Blood pressure is suboptimally controlled. Goals and risks discussed. Beta blocker will be increased to twice a day and low-dose ACE inhibitor added. No renal artery bruits present to suggest renal artery stenosis and her pressures have been variable & not been sustained

## 2012-03-07 NOTE — Progress Notes (Signed)
  Subjective:    Patient ID: Charlene Garza, female    DOB: [birth year], 78 y.o.   MRN: [id number]  HPI Hypertension, ?  UNCONTROLLED: CC:At orthopedic appt due to painful knee  (185/85) - BP checked in office   HPI: Disease Monitoring:  Blood pressure range at home: 134/68 - 196/86; pt states she takes bp at varying times throughout the day, Pt does not normally check bp at home - appointment prompted pt to take BP Chest pain: no Dyspnea: no Claudication: no Prior Advanced Lipid Testing (ex: [place] Heart Panel):no Medications: Pt takes atenolol consistently. Pt has not been on hydrochlorothiazide for about one year now, unsure why stopped. No history of diabetes, gout, postural hypotension or significant hypokalemia ?Medication ?Compliance: yes ?Side Effects: ??Lightheadedness: no ??Urinary frequency: no ??Edema:no Preventative Healthcare: ?Exercise: no exercise program, but pt is active at home ?Diet: no ?Salt restriction: yes  Her BUN has risen from 1.4 and 5/09-1.7 in 3/12.   Family History: mother and father with HTN, mother with MI around age 68 yo   ROS: Weight:Pt has been working on loosing weight and pt has been successful General (ex: fatigue, fever, muscle aches etc.): no     Review of Systems   The right knee was injected with steroids. She'll take aspirin as needed. She denies the intake of excess nonsteroidal anti-inflammatory agents.     Objective:   Physical Exam Gen.: Thin but healthy and well-nourished in appearance. Alert, appropriate and cooperative throughout exam. Ears: wax bilaterally.  Neck: No deformities, masses, or tenderness noted.  Thyroid normal Lungs: Normal respiratory effort; chest expands symmetrically. Lungs are clear to auscultation without rales, wheezes, or increased work of breathing. Heart: Normal rate and rhythm. Normal S1 and S2. No gallop, click, or rub. S4 with slurring at  LSB ; no murmur. Abdomen: Bowel sounds normal; abdomen  soft and nontender. No masses, organomegaly or hernias noted.                                                   Musculoskeletal/extremities: Fusiform enlargement and crepitus of the right knee. Nail health  good. Vascular: Carotid, radial artery, dorsalis pedis and  posterior tibial pulses are full and equal. No bruits present. Neurologic: Alert and oriented x3.   Skin: Intact without suspicious lesions or rashes. Psych: Mood and affect are normal. Normally interactive                                                                                         Assessment & Plan:

## 2012-03-07 NOTE — Patient Instructions (Addendum)
BUN, creatinine, and GFR  all assess kidney function. To protect the kidneys it  is important to control your blood pressure and sugar. You should also stay well hydrated. Drink to thirst, up to 32 ounces of fluids a day. Progressive kidney impairment is typically associated with anemia which is unrelated to  iron deficiency or B12 deficiency.  Blood Pressure Goal  Ideally is an AVERAGE < 135/85. This AVERAGE should be calculated from @ least 5-7 BP readings taken @ different times of day on different days of week. You should not respond to isolated BP readings , but rather the AVERAGE for that week  Please do not use Q-tips as we discussed. Should wax build up occur, please put 2-3 drops of mineral oil in the ear at night and cover the canal with a  cotton ball.In the morning fill the canal with hydrogen peroxide & leave  for 10-15 minutes.Following this shower and use the thinnest washrag available to wick out the wax.

## 2012-03-14 ENCOUNTER — Ambulatory Visit: Payer: Medicare Other

## 2012-03-14 ENCOUNTER — Other Ambulatory Visit: Payer: Self-pay | Admitting: *Deleted

## 2012-03-14 DIAGNOSIS — I10 Essential (primary) hypertension: Secondary | ICD-10-CM

## 2012-03-14 MED ORDER — ATENOLOL 25 MG PO TABS: 25.0000 mg | ORAL_TABLET | Freq: Two times a day (BID) | ORAL | Status: DC

## 2012-04-03 ENCOUNTER — Encounter: Payer: Medicare Other | Admitting: Internal Medicine

## 2012-04-09 ENCOUNTER — Ambulatory Visit: Payer: Medicare Other

## 2012-04-16 ENCOUNTER — Other Ambulatory Visit: Payer: Self-pay | Admitting: Internal Medicine

## 2012-04-16 NOTE — Telephone Encounter (Signed)
Refill done.  

## 2012-05-23 ENCOUNTER — Encounter: Payer: Medicare Other | Admitting: Internal Medicine

## 2012-06-27 ENCOUNTER — Other Ambulatory Visit: Payer: Self-pay | Admitting: Internal Medicine

## 2012-06-27 NOTE — Telephone Encounter (Signed)
LIPID/HEP 272.4/995.20, APPOINTMENT DUE 3-5 DAYS LATER

## 2012-07-18 ENCOUNTER — Other Ambulatory Visit: Payer: Self-pay | Admitting: Internal Medicine

## 2012-07-22 ENCOUNTER — Encounter: Payer: Medicare Other | Admitting: Internal Medicine

## 2012-09-17 ENCOUNTER — Ambulatory Visit (INDEPENDENT_AMBULATORY_CARE_PROVIDER_SITE_OTHER): Payer: Medicare Other

## 2012-09-17 ENCOUNTER — Other Ambulatory Visit: Payer: Self-pay | Admitting: Internal Medicine

## 2012-09-17 DIAGNOSIS — Z23 Encounter for immunization: Secondary | ICD-10-CM

## 2012-09-25 ENCOUNTER — Other Ambulatory Visit: Payer: Self-pay | Admitting: Internal Medicine

## 2012-10-10 ENCOUNTER — Ambulatory Visit (INDEPENDENT_AMBULATORY_CARE_PROVIDER_SITE_OTHER): Payer: Medicare Other | Admitting: Internal Medicine

## 2012-10-10 ENCOUNTER — Encounter: Payer: Self-pay | Admitting: Internal Medicine

## 2012-10-10 VITALS — BP 130/82 | HR 60 | Temp 98.2°F | Resp 14 | Ht 61.0 in | Wt 125.6 lb

## 2012-10-10 DIAGNOSIS — E785 Hyperlipidemia, unspecified: Secondary | ICD-10-CM

## 2012-10-10 DIAGNOSIS — I10 Essential (primary) hypertension: Secondary | ICD-10-CM

## 2012-10-10 DIAGNOSIS — Z0000 Encounter for general adult medical examination without abnormal findings: Secondary | ICD-10-CM

## 2012-10-10 DIAGNOSIS — N289 Disorder of kidney and ureter, unspecified: Secondary | ICD-10-CM

## 2012-10-10 DIAGNOSIS — M949 Disorder of cartilage, unspecified: Secondary | ICD-10-CM

## 2012-10-10 DIAGNOSIS — Z23 Encounter for immunization: Secondary | ICD-10-CM

## 2012-10-10 DIAGNOSIS — E559 Vitamin D deficiency, unspecified: Secondary | ICD-10-CM

## 2012-10-10 DIAGNOSIS — D649 Anemia, unspecified: Secondary | ICD-10-CM

## 2012-10-10 DIAGNOSIS — M899 Disorder of bone, unspecified: Secondary | ICD-10-CM

## 2012-10-10 MED ORDER — RAMIPRIL 2.5 MG PO CAPS: 2.5000 mg | ORAL_CAPSULE | Freq: Every day | ORAL | Status: DC

## 2012-10-10 NOTE — Patient Instructions (Addendum)
Preventive Health Care: Exercise  30-45  minutes a day, 3-4 days a week. Walking is especially valuable in preventing Osteoporosis. Eat a low-fat diet with lots of fruits and vegetables, up to 7-9 servings per day. Consume less than 30 grams of sugar per day from foods & drinks with High Fructose Corn Syrup as #1,2,3 or #4 on label. Eye Doctor - have an eye exam @ least annually Alcohol If you drink, do it moderately - 1 drink per day or less.  Health Care Power of Attorney & Living Will place you in charge of your health care  decisions. Verify these are  in place. Blood Pressure Goal  Ideally is an AVERAGE < 135/85. This AVERAGE should be calculated from @ least 5-7 BP readings taken @ different times of day on different days of week. You should not respond to isolated BP readings , but rather the AVERAGE for that week  Please  schedule fasting Labs : BMET,Lipids, hepatic panel, CBC & dif, TSH, vitamin D level. PLEASE BRING THESE INSTRUCTIONS TO FOLLOW UP  LAB APPOINTMENT.This will guarantee correct labs are drawn, eliminating need for repeat blood sampling ( needle sticks ! ). Diagnoses /Codes:285.9, 272.4,401.9,733.90. If you activate My Chart; the results can be released to you as soon as they populate from the lab. If you choose not to use this program; the labs have to be reviewed, copied & mailed   causing a delay in getting the results to you.

## 2012-10-10 NOTE — Progress Notes (Signed)
Subjective:    Patient ID: Charlene Garza, female    DOB: 1930/11/19, 78 y.o.   MRN: [id number]  HPI Medicare Wellness Visit:  The following psychosocial & medical history were reviewed as required by Medicare.   Social history: caffeine: 1.5 cups / day , alcohol:  Wine 7-14 / week ,  tobacco use : never  & exercise : stairs @ home   Home & personal  safety / fall risk: no issues, activities of daily living: no limitations , seatbelt use : yes , and smoke alarm employment : yes.  Power of Attorney/Living Will status : pending  Vision ( as recorded per Nurse) & Hearing  evaluation :  Ophth exam every 2 yrs; no hearing exam. Orientation :oriented X 3 , memory & recall :good, spelling  testing: good,and mood & affect : normal . Depression / anxiety: see Nursing assessment Travel history : last 2012 [place] , immunization status : up to date , transfusion history:  no, and preventive health surveillance ( colonoscopies, BMD , etc as per protocol/ [facility]): never, Dental care:  Every 12 mos . Chart reviewed &  Updated. Active issues reviewed & addressed.     Review of Systems HYPERTENSION: Disease Monitoring: Blood pressure - average 132/ 72  Chest pain, palpitations- no       Dyspnea- no Medications: Compliance- she takes ACE-I if BP up Lightheadedness,Syncope- no    Edema- no  HYPERLIPIDEMIA: Medications: Compliance- yes Abd pain, bowel changes- no   Muscle aches- no but thumb pain           Objective:   Physical ExamGen.:  well-nourished in appearance. Alert, appropriate and cooperative throughout exam.Appears younger than stated age  Head: Normocephalic without obvious abnormalities  Eyes: No corneal or conjunctival inflammation noted. Pupils small  Extraocular motion intact. Vision grossly normal with lenses. Ears: External  ear exam reveals no significant lesions or deformities. RTM normal; wax on L. Hearing is grossly decreased on L. Nose: External nasal exam reveals no  deformity or inflammation. Nasal mucosa are pink and moist. No lesions or exudates noted.   Mouth: Oral mucosa and oropharynx reveal no lesions or exudates. Teeth in good repair.Osteoma of palate Neck: No deformities, masses, or tenderness noted. Range of motion slightly decreased. Thyroid normal. Lungs: Normal respiratory effort; chest expands symmetrically. Lungs are clear to auscultation without rales, wheezes, or increased work of breathing. Heart: Normal rate and rhythm. Normal S1 and S2. No gallop, click, or rub. Grade 1/6 systolic murmur  Abdomen: Bowel sounds normal; abdomen soft and nontender. No masses, organomegaly or hernias noted.Aorta palpable ; no AAA  Genitalia: Gyn recommended every 2 years                                                               Musculoskeletal/extremities: Slight lordosis noted of  the thoracic  spine. No clubbing, cyanosis, edema, or deformity noted. Range of motion  normal .Tone & strength  normal.Joints normal. Nail health  good. Vascular: Carotid, radial artery, dorsalis pedis and  posterior tibial pulses are full and equal. No bruits present. Neurologic: Alert and oriented x3. Deep tendon reflexes symmetrical and normal.          Skin: Intact without suspicious lesions or rashes. Lymph: No cervical, axillary lymphadenopathy  present. Psych: Mood and affect are normal. Normally interactive                                                                                         Assessment & Plan:  #1 Medicare Wellness Exam; criteria met ; data entered #2 blood pressure monitoring goals discussed. Because of the renal insufficiency; hydrochlorothiazide will be discontinued. She'll be asked to take the low-dose ACE inhibitor on a regular basis for renal protection Plan: see Orders

## 2012-10-14 ENCOUNTER — Emergency Department (HOSPITAL_COMMUNITY): Payer: Medicare Other

## 2012-10-14 ENCOUNTER — Inpatient Hospital Stay (HOSPITAL_COMMUNITY)
Admission: EM | Admit: 2012-10-14 | Discharge: 2012-10-19 | DRG: 854 | Disposition: A | Discharge status: Home / Self Care | Payer: Medicare Other | Attending: Internal Medicine | Admitting: Internal Medicine

## 2012-10-14 ENCOUNTER — Encounter (HOSPITAL_COMMUNITY): Payer: Self-pay | Admitting: *Deleted

## 2012-10-14 DIAGNOSIS — K8020 Calculus of gallbladder without cholecystitis without obstruction: Secondary | ICD-10-CM

## 2012-10-14 DIAGNOSIS — M899 Disorder of bone, unspecified: Secondary | ICD-10-CM

## 2012-10-14 DIAGNOSIS — K8000 Calculus of gallbladder with acute cholecystitis without obstruction: Secondary | ICD-10-CM | POA: Diagnosis present

## 2012-10-14 DIAGNOSIS — Z96659 Presence of unspecified artificial knee joint: Secondary | ICD-10-CM

## 2012-10-14 DIAGNOSIS — I2489 Other forms of acute ischemic heart disease: Secondary | ICD-10-CM | POA: Diagnosis present

## 2012-10-14 DIAGNOSIS — I248 Other forms of acute ischemic heart disease: Secondary | ICD-10-CM | POA: Diagnosis present

## 2012-10-14 DIAGNOSIS — R410 Disorientation, unspecified: Secondary | ICD-10-CM

## 2012-10-14 DIAGNOSIS — R778 Other specified abnormalities of plasma proteins: Secondary | ICD-10-CM | POA: Diagnosis present

## 2012-10-14 DIAGNOSIS — R7401 Elevation of levels of liver transaminase levels: Secondary | ICD-10-CM | POA: Diagnosis present

## 2012-10-14 DIAGNOSIS — A498 Other bacterial infections of unspecified site: Secondary | ICD-10-CM | POA: Diagnosis present

## 2012-10-14 DIAGNOSIS — E559 Vitamin D deficiency, unspecified: Secondary | ICD-10-CM

## 2012-10-14 DIAGNOSIS — E785 Hyperlipidemia, unspecified: Secondary | ICD-10-CM | POA: Diagnosis present

## 2012-10-14 DIAGNOSIS — Z9049 Acquired absence of other specified parts of digestive tract: Secondary | ICD-10-CM | POA: Diagnosis present

## 2012-10-14 DIAGNOSIS — I129 Hypertensive chronic kidney disease with stage 1 through stage 4 chronic kidney disease, or unspecified chronic kidney disease: Secondary | ICD-10-CM | POA: Diagnosis present

## 2012-10-14 DIAGNOSIS — D6489 Other specified anemias: Secondary | ICD-10-CM | POA: Diagnosis present

## 2012-10-14 DIAGNOSIS — T426X5A Adverse effect of other antiepileptic and sedative-hypnotic drugs, initial encounter: Secondary | ICD-10-CM | POA: Diagnosis present

## 2012-10-14 DIAGNOSIS — R6889 Other general symptoms and signs: Secondary | ICD-10-CM

## 2012-10-14 DIAGNOSIS — Z8249 Family history of ischemic heart disease and other diseases of the circulatory system: Secondary | ICD-10-CM

## 2012-10-14 DIAGNOSIS — M25519 Pain in unspecified shoulder: Secondary | ICD-10-CM | POA: Diagnosis present

## 2012-10-14 DIAGNOSIS — R109 Unspecified abdominal pain: Secondary | ICD-10-CM

## 2012-10-14 DIAGNOSIS — N183 Chronic kidney disease, stage 3 unspecified: Secondary | ICD-10-CM | POA: Diagnosis present

## 2012-10-14 DIAGNOSIS — A419 Sepsis, unspecified organism: Principal | ICD-10-CM | POA: Diagnosis present

## 2012-10-14 DIAGNOSIS — I10 Essential (primary) hypertension: Secondary | ICD-10-CM | POA: Diagnosis present

## 2012-10-14 DIAGNOSIS — D649 Anemia, unspecified: Secondary | ICD-10-CM

## 2012-10-14 DIAGNOSIS — Z79899 Other long term (current) drug therapy: Secondary | ICD-10-CM

## 2012-10-14 DIAGNOSIS — F19921 Other psychoactive substance use, unspecified with intoxication with delirium: Secondary | ICD-10-CM | POA: Diagnosis present

## 2012-10-14 DIAGNOSIS — N390 Urinary tract infection, site not specified: Secondary | ICD-10-CM | POA: Diagnosis present

## 2012-10-14 DIAGNOSIS — D696 Thrombocytopenia, unspecified: Secondary | ICD-10-CM | POA: Diagnosis present

## 2012-10-14 DIAGNOSIS — N289 Disorder of kidney and ureter, unspecified: Secondary | ICD-10-CM | POA: Diagnosis present

## 2012-10-14 HISTORY — DX: Unspecified osteoarthritis, unspecified site: M19.90

## 2012-10-14 MED ORDER — ACETAMINOPHEN 325 MG PO TABS
650.0000 mg | ORAL_TABLET | Freq: Once | ORAL | Status: AC
  Administered 2012-10-14: 650 mg via ORAL
  Filled 2012-10-14: qty 2

## 2012-10-14 MED ORDER — SODIUM CHLORIDE 0.9 % IV BOLUS (SEPSIS)
500.0000 mL | Freq: Once | INTRAVENOUS | Status: AC
  Administered 2012-10-15: 500 mL via INTRAVENOUS

## 2012-10-14 MED ORDER — MORPHINE SULFATE 4 MG/ML IJ SOLN
6.0000 mg | Freq: Once | INTRAMUSCULAR | Status: AC
  Administered 2012-10-15: 4 mg via INTRAVENOUS
  Filled 2012-10-14: qty 2

## 2012-10-14 NOTE — ED Notes (Signed)
Patient transported to X-ray 

## 2012-10-14 NOTE — ED Notes (Signed)
The pt has had rt shoulder pain since yesterday no known injury.  She is also c/o nausea with rt neck and anterior neck pain.  She does not feel well

## 2012-10-14 NOTE — ED Provider Notes (Signed)
History     CSN: [id number]  Arrival date & time 10/14/12  2221   First MD Initiated Contact with Patient 10/14/12 2241      Chief Complaint  Patient presents with  . Shoulder Pain    (Consider location/radiation/quality/duration/timing/severity/associated sxs/prior treatment) HPI Comments: Patient presents with intermittent chills and myalgias for approximately 24 hours. Has tried aspirin at home with mild relief.  Patient is a 78 y.o. female presenting with general illness. The history is provided by the patient. No language interpreter was used.  Illness  The current episode started yesterday. The onset was gradual. The problem occurs rarely. The problem has been gradually worsening. The problem is moderate. Nothing relieves the symptoms. Nothing aggravates the symptoms. Associated symptoms include abdominal pain (mild over R side), nausea, headaches (mild intermittent), muscle aches and neck pain (worse over R side and R trapezius). Pertinent negatives include no fever, no photophobia, no constipation, no diarrhea, no vomiting, no congestion, no rhinorrhea, no sore throat, no stridor, no swollen glands, no neck stiffness, no cough, no URI, no wheezing, no rash and no eye discharge. She has been behaving normally. She has been eating and drinking normally. Urine output has been normal. The last void occurred less than 6 hours ago. There were no sick contacts. She has received no recent medical care.    Past Medical History  Diagnosis Date  . Hypertension   . Anemia   . Hyperlipidemia   . Osteopenia     T score : - 2.4 @ R femoral neck  . Renal insufficiency     Past Surgical History  Procedure Date  . Cataract extraction     bilaterally  . Knee surgery 10/2003    L TKR    Family History  Problem Relation Age of Onset  . Hypertension Father   . Hypertension Mother   . Heart attack Mother 72  . Hypertension Brother   . Diabetes Neg Hx   . Stroke Neg Hx     History    Substance Use Topics  . Smoking status: Never Smoker   . Smokeless tobacco: Not on file  . Alcohol Use: Yes     wine 7-14/ week    OB History    Grav Para Term Preterm Abortions TAB SAB Ect Mult Living   3 1              Review of Systems  Constitutional: Positive for chills. Negative for fever, activity change and appetite change.  HENT: Positive for neck pain (worse over R side and R trapezius). Negative for congestion, sore throat, rhinorrhea, neck stiffness and sinus pressure.   Eyes: Negative for photophobia, discharge and visual disturbance.  Respiratory: Negative for cough, chest tightness, shortness of breath, wheezing and stridor.   Cardiovascular: Negative for chest pain and leg swelling.  Gastrointestinal: Positive for nausea and abdominal pain (mild over R side). Negative for vomiting, diarrhea, constipation and abdominal distention.  Genitourinary: Negative for decreased urine volume and difficulty urinating.  Musculoskeletal: Negative for back pain and arthralgias.  Skin: Negative for color change, pallor and rash.  Neurological: Positive for headaches (mild intermittent). Negative for weakness and light-headedness.  Psychiatric/Behavioral: Negative for behavioral problems and agitation.  All other systems reviewed and are negative.    Allergies  Review of patient's allergies indicates no known allergies.  Home Medications   Current Outpatient Rx  Name Route Sig Dispense Refill  . ATENOLOL 25 MG PO TABS Oral Take 25 mg by  mouth daily.     Marland Kitchen VITAMIN-B COMPLEX PO Oral Take 1 tablet by mouth daily.     Marland Kitchen CALCIUM 100 MG PO CAPS Oral Take 100 mg by mouth daily.     Marland Kitchen VITAMIN D 1000 UNITS PO CAPS Oral Take 1,000 Units by mouth daily.      Marland Kitchen LOVASTATIN 40 MG PO TABS Oral Take 40 mg by mouth at bedtime.    Marland Kitchen RAMIPRIL 2.5 MG PO CAPS Oral Take 2.5 mg by mouth daily.      BP 143/74  Pulse 81  Temp 101.1 F (38.4 C) (Oral)  Resp 19  SpO2 93%  Physical Exam   Nursing note and vitals reviewed. Constitutional: She is oriented to person, place, and time. She appears well-developed and well-nourished. No distress.  HENT:  Head: Normocephalic and atraumatic.  Mouth/Throat: No oropharyngeal exudate.  Eyes: EOM are normal. Pupils are equal, round, and reactive to light. Right eye exhibits no discharge. Left eye exhibits no discharge.  Neck: Normal range of motion. Neck supple. No JVD present.       FROM w/o pain. Mild TTP over R paraspinal and R trapezius. FROM R shoulder  Cardiovascular: Normal rate, regular rhythm and normal heart sounds.   Pulmonary/Chest: Effort normal and breath sounds normal. No stridor. No respiratory distress. She has no wheezes. She has no rales. She exhibits no tenderness.  Abdominal: Soft. Bowel sounds are normal. She exhibits no distension. There is tenderness (mild TTP over R abd wall. no guarding or significant TTP to RLQ or RUQ, neg murphys.). There is no guarding.  Musculoskeletal: Normal range of motion. She exhibits no edema and no tenderness.  Neurological: She is alert and oriented to person, place, and time. No cranial nerve deficit. She exhibits normal muscle tone.  Skin: Skin is warm and dry. No rash noted. She is not diaphoretic.  Psychiatric: She has a normal mood and affect. Her behavior is normal. Judgment and thought content normal.    ED Course  Procedures (including critical care time)   Labs Reviewed  CBC WITH DIFFERENTIAL  COMPREHENSIVE METABOLIC PANEL  TROPONIN I  INFLUENZA PANEL BY PCR  URINALYSIS, ROUTINE W REFLEX MICROSCOPIC   Dg Chest 2 View  10/14/2012  *RADIOLOGY REPORT*  Clinical Data: Fever.  Upper chest discomfort.  CHEST - 2 VIEW  Comparison: None.  Findings: Minimal linear atelectasis or scar is seen in the left lung base.  The lungs  are otherwise clear.  There is cardiomegaly. No pneumothorax or pleural fluid.  Remote appearing T8, T9 and T12 compression fracture deformities are  noted.  IMPRESSION: No acute finding.  Cardiomegaly.   Original Report Authenticated By: Bernadene Bell. D'ALESSIO, M.D.      1. Flu-like symptoms   2. Abdominal  pain, other specified site     MDM  Patient presents with flulike symptoms. Does not have any significant respiratory symptoms such as cough, shortness of breath, or chest pain. Right shoulder pain is chronic and unchanged. Due to fever initiated infectious workup including chest x-ray, CBC, urinalysis. Give Tylenol for symptom relief.   EKG interpreted by me. NSR, no acute ST or TW changes indicating acute ischemia or infarction. No acute arrhythmias.   12:09 AM will also CT abd to further eval abd pain to r/o divertic or appy. If nml plan will be to tx for flu. Stable.       Warrick Parisian, MD 10/15/12 [phone]

## 2012-10-15 ENCOUNTER — Emergency Department (HOSPITAL_COMMUNITY): Payer: Medicare Other

## 2012-10-15 ENCOUNTER — Encounter (HOSPITAL_COMMUNITY): Payer: Self-pay | Admitting: Radiology

## 2012-10-15 DIAGNOSIS — Z9049 Acquired absence of other specified parts of digestive tract: Secondary | ICD-10-CM | POA: Diagnosis present

## 2012-10-15 DIAGNOSIS — R509 Fever, unspecified: Secondary | ICD-10-CM

## 2012-10-15 DIAGNOSIS — R7401 Elevation of levels of liver transaminase levels: Secondary | ICD-10-CM | POA: Diagnosis present

## 2012-10-15 DIAGNOSIS — R7989 Other specified abnormal findings of blood chemistry: Secondary | ICD-10-CM

## 2012-10-15 DIAGNOSIS — R778 Other specified abnormalities of plasma proteins: Secondary | ICD-10-CM | POA: Diagnosis present

## 2012-10-15 DIAGNOSIS — N390 Urinary tract infection, site not specified: Secondary | ICD-10-CM | POA: Diagnosis present

## 2012-10-15 LAB — RETICULOCYTES
RBC.: 2.68 MIL/uL — ABNORMAL LOW (ref 3.87–5.11)
Retic Ct Pct: 1.5 % (ref 0.4–3.1)

## 2012-10-15 LAB — COMPREHENSIVE METABOLIC PANEL
ALT: 119 U/L — ABNORMAL HIGH (ref 0–35)
AST: 162 U/L — ABNORMAL HIGH (ref 0–37)
AST: 111 U/L — ABNORMAL HIGH (ref 0–37)
Albumin: 3.4 g/dL — ABNORMAL LOW (ref 3.5–5.2)
BUN: 30 mg/dL — ABNORMAL HIGH (ref 6–23)
CO2: 24 mEq/L (ref 19–32)
CO2: 23 mEq/L (ref 19–32)
Calcium: 8.9 mg/dL (ref 8.4–10.5)
Calcium: 7.8 mg/dL — ABNORMAL LOW (ref 8.4–10.5)
Chloride: 101 mEq/L (ref 96–112)
Creatinine, Ser: 1.4 mg/dL — ABNORMAL HIGH (ref 0.50–1.10)
GFR calc Af Amer: 40 mL/min — ABNORMAL LOW (ref >90)
GFR calc non Af Amer: 37 mL/min — ABNORMAL LOW (ref >90)
GFR calc non Af Amer: 34 mL/min — ABNORMAL LOW (ref >90)
Sodium: 137 mEq/L (ref 135–145)
Total Bilirubin: 0.5 mg/dL (ref 0.3–1.2)
Total Bilirubin: 0.3 mg/dL (ref 0.3–1.2)

## 2012-10-15 LAB — CBC
HCT: 25.1 % — ABNORMAL LOW (ref 36.0–46.0)
MCH: 31.7 pg (ref 26.0–34.0)
MCV: 92.6 fL (ref 78.0–100.0)
Platelets: 110 10*3/uL — ABNORMAL LOW (ref 150–400)
RBC: 2.71 MIL/uL — ABNORMAL LOW (ref 3.87–5.11)

## 2012-10-15 LAB — CBC WITH DIFFERENTIAL/PLATELET
Basophils Absolute: 0 10*3/uL (ref 0.0–0.1)
Basophils Relative: 0 % (ref 0–1)
Lymphocytes Relative: 3 % — ABNORMAL LOW (ref 12–46)
Neutro Abs: 7.3 10*3/uL (ref 1.7–7.7)
Platelets: 123 10*3/uL — ABNORMAL LOW (ref 150–400)
RDW: 13 % (ref 11.5–15.5)
WBC: 7.8 10*3/uL (ref 4.0–10.5)

## 2012-10-15 LAB — URINALYSIS, ROUTINE W REFLEX MICROSCOPIC
Protein, ur: 100 mg/dL — AB
Specific Gravity, Urine: 1.016 (ref 1.005–1.030)
Urobilinogen, UA: 0.2 mg/dL (ref 0.0–1.0)

## 2012-10-15 LAB — GLUCOSE, CAPILLARY: Glucose-Capillary: 116 mg/dL — ABNORMAL HIGH (ref 70–99)

## 2012-10-15 LAB — TROPONIN I
Troponin I: 0.64 ng/mL (ref <0.30)
Troponin I: 0.47 ng/mL (ref <0.30)
Troponin I: 0.37 ng/mL (ref <0.30)

## 2012-10-15 LAB — URINE MICROSCOPIC-ADD ON

## 2012-10-15 LAB — INFLUENZA PANEL BY PCR (TYPE A & B): Influenza B By PCR: NEGATIVE

## 2012-10-15 MED ORDER — HEPARIN (PORCINE) IN NACL 100-0.45 UNIT/ML-% IJ SOLN
700.0000 [IU]/h | INTRAMUSCULAR | Status: DC
  Filled 2012-10-15: qty 250

## 2012-10-15 MED ORDER — DEXTROSE 5 % IV SOLN
1.0000 g | INTRAVENOUS | Status: DC
  Administered 2012-10-15 – 2012-10-16 (×2): 1 g via INTRAVENOUS
  Filled 2012-10-15 (×2): qty 10

## 2012-10-15 MED ORDER — SIMVASTATIN 20 MG PO TABS
20.0000 mg | ORAL_TABLET | Freq: Every day | ORAL | Status: DC
  Administered 2012-10-15: 20 mg via ORAL
  Filled 2012-10-15 (×2): qty 1

## 2012-10-15 MED ORDER — IOHEXOL 300 MG/ML  SOLN
20.0000 mL | INTRAMUSCULAR | Status: AC
  Administered 2012-10-15: 20 mL via ORAL

## 2012-10-15 MED ORDER — ASPIRIN 81 MG PO CHEW
324.0000 mg | CHEWABLE_TABLET | Freq: Once | ORAL | Status: AC
  Administered 2012-10-15: 324 mg via ORAL
  Filled 2012-10-15: qty 4

## 2012-10-15 MED ORDER — IOHEXOL 300 MG/ML  SOLN
80.0000 mL | Freq: Once | INTRAMUSCULAR | Status: AC | PRN
  Administered 2012-10-15: 80 mL via INTRAVENOUS

## 2012-10-15 MED ORDER — SODIUM CHLORIDE 0.9 % IJ SOLN
3.0000 mL | Freq: Two times a day (BID) | INTRAMUSCULAR | Status: DC
  Administered 2012-10-15 – 2012-10-18 (×5): 3 mL via INTRAVENOUS

## 2012-10-15 MED ORDER — ASPIRIN EC 325 MG PO TBEC
325.0000 mg | DELAYED_RELEASE_TABLET | Freq: Every day | ORAL | Status: DC
  Administered 2012-10-15 – 2012-10-17 (×3): 325 mg via ORAL
  Filled 2012-10-15 (×3): qty 1

## 2012-10-15 MED ORDER — HEPARIN SODIUM (PORCINE) 5000 UNIT/ML IJ SOLN: 60.0000 [IU]/kg | Freq: Once | INTRAMUSCULAR | Status: DC

## 2012-10-15 MED ORDER — SODIUM CHLORIDE 0.9 % IV SOLN
INTRAVENOUS | Status: DC
  Administered 2012-10-15 – 2012-10-17 (×3): via INTRAVENOUS

## 2012-10-15 MED ORDER — ASPIRIN EC 325 MG PO TBEC: 325.0000 mg | DELAYED_RELEASE_TABLET | Freq: Every day | ORAL | Status: DC

## 2012-10-15 MED ORDER — HEPARIN BOLUS VIA INFUSION: 3000.0000 [IU] | Freq: Once | INTRAVENOUS | Status: DC

## 2012-10-15 MED ORDER — SODIUM CHLORIDE 0.9 % IV BOLUS (SEPSIS)
1000.0000 mL | Freq: Once | INTRAVENOUS | Status: AC
  Administered 2012-10-15: 1000 mL via INTRAVENOUS

## 2012-10-15 MED ORDER — HYDROMORPHONE HCL PF 1 MG/ML IJ SOLN: 0.5000 mg | INTRAMUSCULAR | Status: DC | PRN

## 2012-10-15 MED ORDER — ONDANSETRON HCL 4 MG/2ML IJ SOLN
4.0000 mg | Freq: Four times a day (QID) | INTRAMUSCULAR | Status: DC | PRN
  Administered 2012-10-15 – 2012-10-19 (×6): 4 mg via INTRAVENOUS
  Filled 2012-10-15 (×7): qty 2

## 2012-10-15 MED ORDER — ACETAMINOPHEN 325 MG PO TABS
650.0000 mg | ORAL_TABLET | Freq: Four times a day (QID) | ORAL | Status: DC | PRN
  Administered 2012-10-15 – 2012-10-16 (×3): 650 mg via ORAL
  Filled 2012-10-15 (×3): qty 2

## 2012-10-15 MED ORDER — OXYCODONE-ACETAMINOPHEN 5-325 MG PO TABS
1.0000 | ORAL_TABLET | ORAL | Status: DC | PRN
  Administered 2012-10-15 – 2012-10-16 (×2): 1 via ORAL
  Administered 2012-10-17: 2 via ORAL
  Administered 2012-10-17 – 2012-10-18 (×3): 1 via ORAL
  Filled 2012-10-15 (×5): qty 1
  Filled 2012-10-15: qty 2

## 2012-10-15 MED ORDER — ONDANSETRON HCL 4 MG/2ML IJ SOLN
4.0000 mg | Freq: Once | INTRAMUSCULAR | Status: AC
  Administered 2012-10-15: 4 mg via INTRAVENOUS
  Filled 2012-10-15: qty 2

## 2012-10-15 NOTE — ED Notes (Signed)
Notified Dr. Julian Reil of hgb 8.6 and no new orders

## 2012-10-15 NOTE — H&P (Signed)
[facility] History and Physical  LANDIS CASSARO AVW:[id number] DOB: 07-25-1930 DOA: 10/14/2012  Referring physician: ED PCP: Marga Melnick, MD  Specialists: [facility] called by ED  Chief Complaint: Fever, rigors  HPI: Charlene Garza is a 78 y.o. female who presents with complaint of fever, chills, rigors onset 2 days ago, becoming increasingly severe until she had to go to the ED today, primary complaint being the rigors.  Nothing makes it better or worse.  Temperature in ED was 101.1.  She was found to have a UA consistent with UTI, elevated transaminases, and an elevated troponin initially of 0.33 then of 0.63.  Cards was consulted and suggested that given her symptoms she probably didn't have an MI but rather likely had viral syndrome with pericarditis / myocarditis.  They recommended medical admission and holding off on heparin for now.  Repaged them at 5am with new elevated troponin, they again recommended giving ASA, but not giving heparin GTT, and recommended ECHO in AM.  Review of Systems: despite her elevated troponin she has no chest pain, no SOB, no DOE, no arm nor jaw pain, no other symptoms attributable to an MI, no N/V/D 12 systems reviewed and otherwise negative.  Past Medical History  Diagnosis Date  . Hypertension   . Anemia   . Hyperlipidemia   . Osteopenia     T score : - 2.4 @ R femoral neck  . Renal insufficiency    Past Surgical History  Procedure Date  . Cataract extraction     bilaterally  . Knee surgery 10/2003    L TKR   Social History:  reports that she has never smoked. She does not have any smokeless tobacco history on file. She reports that she drinks alcohol. She reports that she does not use illicit drugs. Patient lives at home performs ADLs No Known Allergies  Family History  Problem Relation Age of Onset  . Hypertension Father   . Hypertension Mother   . Heart attack Mother 27  . Hypertension Brother   . Diabetes Neg Hx     . Stroke Neg Hx      Prior to Admission medications   Medication Sig Start Date End Date Taking? Authorizing Provider  atenolol (TENORMIN) 25 MG tablet Take 25 mg by mouth daily.  09/25/12  Yes Pecola Lawless, MD  B Complex Vitamins (VITAMIN-B COMPLEX PO) Take 1 tablet by mouth daily.    Yes Historical Provider, MD  Calcium 100 MG capsule Take 100 mg by mouth daily.    Yes Historical Provider, MD  Cholecalciferol (VITAMIN D) 1000 UNITS capsule Take 1,000 Units by mouth daily.     Yes Historical Provider, MD  lovastatin (MEVACOR) 40 MG tablet Take 40 mg by mouth at bedtime.   Yes Historical Provider, MD  ramipril (ALTACE) 2.5 MG capsule Take 2.5 mg by mouth daily.   Yes Historical Provider, MD   Physical Exam: Filed Vitals:   10/15/12 0329 10/15/12 0400 10/15/12 0415 10/15/12 0419  BP: 98/72 97/58 108/41 108/41  Pulse: 66 63 66   Temp: 98.4 F (36.9 C)     TempSrc: Oral     Resp: 22 18 20 18   SpO2: 99% 99% 99% 98%    General:  NAD, resting comfortably in bed Eyes: PEERLA EOMI ENT: mucous membranes moist Neck: supple w/o JVD Cardiovascular: RRR w/o MRG Respiratory: CTA B Abdomen: soft, nt, nd, bs+ Skin: no rash nor lesion Musculoskeletal: MAE, full ROM all 4 extremities Psychiatric:  normal tone and affect Neurologic: AAOx3, grossly non-focal  Labs on Admission:  Basic Metabolic Panel:  Lab 10/14/12 9604  NA 137  K 3.6  CL 101  CO2 24  GLUCOSE 104*  BUN 32*  CREATININE 1.31*  CALCIUM 8.9  MG --  PHOS --   Liver Function Tests:  Lab 10/14/12 2229  AST 162*  ALT 119*  ALKPHOS 129*  BILITOT 0.5  PROT 6.3  ALBUMIN 3.4*   No results found for this basename: LIPASE:5,AMYLASE:5 in the last 168 hours No results found for this basename: AMMONIA:5 in the last 168 hours CBC:  Lab 10/14/12 2229  WBC 7.8  NEUTROABS 7.3  HGB 10.0*  HCT 28.4*  MCV 91.9  PLT 123*   Cardiac Enzymes:  Lab 10/15/12 0251 10/14/12 2230  CKTOTAL -- --  CKMB -- --  CKMBINDEX --  --  TROPONINI 0.64* 0.33*    BNP (last 3 results) No results found for this basename: PROBNP:3 in the last 8760 hours CBG: No results found for this basename: GLUCAP:5 in the last 168 hours  Radiological Exams on Admission: Dg Chest 2 View  10/14/2012  *RADIOLOGY REPORT*  Clinical Data: Fever.  Upper chest discomfort.  CHEST - 2 VIEW  Comparison: None.  Findings: Minimal linear atelectasis or scar is seen in the left lung base.  The lungs  are otherwise clear.  There is cardiomegaly. No pneumothorax or pleural fluid.  Remote appearing T8, T9 and T12 compression fracture deformities are noted.  IMPRESSION: No acute finding.  Cardiomegaly.   Original Report Authenticated By: Bernadene Bell. Maricela Curet, M.D.    Ct Abdomen Pelvis W Contrast  10/15/2012  *RADIOLOGY REPORT*  Clinical Data: Right lower quadrant pain.  CT ABDOMEN AND PELVIS WITH CONTRAST  Technique:  Multidetector CT imaging of the abdomen and pelvis was performed following the standard protocol during bolus administration of intravenous contrast.  Contrast: 80mL OMNIPAQUE IOHEXOL 300 MG/ML  SOLN  Comparison: None.  Findings: There is some atelectatic change or scar in the lung bases.  Cardiomegaly is noted.  There is no pleural or pericardial effusion.  Several small stones identified within the gallbladder but there is no pericholecystic fluid or wall thickening.  Intrahepatic bile ducts are minimally prominent.  The liver is otherwise unremarkable.  The common bile duct and pancreatic duct appear normal.  The spleen, adrenal glands and left kidney are unremarkable.  A right renal cyst measures 3.4 cm in diameter.  Uterus, adnexa and urinary bladder are unremarkable.  A very small amount of free pelvic fluid is identified.  The stomach and small and large bowel and appendix are unremarkable.  Mild superior endplate compression fractures of T11 and T8 appear remote.  The patient has multilevel degenerative disease in the visualized spine.  No  lytic or sclerotic lesion is identified.  IMPRESSION:  1.  Trace amount of free pelvic fluid of uncertain etiology. Question gastroenteritis. The appendix appears normal. 2.  Multiple small gallstones without evidence of cholecystitis. 3.  Minimal prominence of the intrahepatic biliary ducts may be related to the patient's age.  Correlation with liver function test could be used for further evaluation. 4.  Cardiomegaly.   Original Report Authenticated By: Bernadene Bell. D'ALESSIO, M.D.     EKG: Independently reviewed.  Assessment/Plan Active Problems:  Elevated troponin  1. Fever - likely due to either UTI or viral cause, treating presumed UTI with rocephin, doubt influenza virus given complete lack of URI / LRI symptoms. 2. Elevated troponin - cards is on board, spoke with cardiologist again after the new 0.64 troponin level came back, he again recommended not putting patient on heparin gtt and evaluating in AM for pericarditis / myocarditis with echo, heparin gtt could cause bleeding if it were pericarditis, he did recommend ASA 325 (which patient received already), already on statin, holding ACEI and B blocker due to hypotension. 3. Mild transaminitis - recheck in AM, suspect that this may be a component of a systemic viral infection vs secondary to sepsis.  Doubt viral hepatitis given the low elevation. 4. CKD stage 3 - appears chronic and stable at this point but monitoring Is and Os given hypotension 5. HTN - holding HTN meds since hypotensive in ED. 6. Hypotension - mild hypotension in ED probably due to morphine but I cannot prove at this point that this is not due to ACS or Sepsis, patient does not at all seem septic on evaluation at this point in time and has no symptoms nor ekg changes of ACS, but will give gentle hydration, monitor Is and Os  Code Status: Full Code Family Communication: Spoke with patient and daughter who is at  bedside Disposition Plan: Admit to obs   Time spent: 50 min  GARDNER, JARED M. [facility] Pager [phone]  If 7PM-7AM, please contact night-coverage [email or web address] Password [email or web address] 10/15/2012, 4:54 AM

## 2012-10-15 NOTE — ED Notes (Signed)
Admitting MD at bedside.

## 2012-10-15 NOTE — ED Notes (Signed)
Notified Dr. Lanae Boast that Troponin doubled to 0.64 and he is going to speak with cardiology to see if they want to admit patient instead of [facility]. Pt resting comfortably

## 2012-10-15 NOTE — Progress Notes (Signed)
ANTICOAGULATION CONSULT NOTE - Initial Consult  Pharmacy Consult for heparin Indication: NTSEMI  No Known Allergies  Patient Measurements:   Heparin Dosing Weight: 57 kg  Vital Signs: Temp: 101.1 F (38.4 C) (10/28 2224) Temp src: Oral (10/28 2224) BP: 95/72 mmHg (10/29 0130) Pulse Rate: 73  (10/29 0130)  Labs:  Basename 10/14/12 2230 10/14/12 2229  HGB -- 10.0*  HCT -- 28.4*  PLT -- 123*  APTT -- --  LABPROT -- --  INR -- --  HEPARINUNFRC -- --  CREATININE -- 1.31*  CKTOTAL -- --  CKMB -- --  TROPONINI 0.33* --    The CrCl is unknown because both a height and weight (above a minimum accepted value) are required for this calculation.   Medical History: Past Medical History  Diagnosis Date  . Hypertension   . Anemia   . Hyperlipidemia   . Osteopenia     T score : - 2.4 @ R femoral neck  . Renal insufficiency     Medications:  Scheduled:    . acetaminophen  650 mg Oral Once  . aspirin  324 mg Oral Once  . iohexol  20 mL Oral Q1 Hr x 2  .  morphine injection  6 mg Intravenous Once  . ondansetron (ZOFRAN) IV  4 mg Intravenous Once  . DISCONTD: heparin  60 Units/kg Intravenous Once   Infusions:    . sodium chloride Stopped (10/15/12 0053)    Assessment: 78 yo female with NTSEMI will be started on heparin therapy.  Baseline H/H 10/28.4; Plt 123.  Not on oral anticoaglant at home.  Goal of Therapy:  Heparin level 0.3-0.7 units/ml Monitor platelets by anticoagulation protocol: Yes   Plan:  1) Heparin 3000 units iv bolus x1, then start heparin drip at 700 uits/hr 2) 8hr heparin level  3) Daily heparin level and CBC  Oakley Orban, Tsz-Yin 10/15/2012,1:42 AM

## 2012-10-15 NOTE — ED Notes (Signed)
Pt c/o shaking of the hands since yesterday evening. States that this AM it resolved, but noticed it again this afternoon. Denies dizziness, SOB, lightheadedness. Pt reports decrease in appetite since yesterday, stating "I just don't feel well." Reports nausea around 2200, but denies vomiting. Pt c/o 3/10 pain to right neck since yesterday. Denies pain on palpation. Pt c/o 2/10 pain to right lower quadrant. Mild pain on palpation. Denies urinary sym. Last normal BM today. Pt also reports "my eyes feel like they are are burning". Vital signs stable. Family at bedside.

## 2012-10-15 NOTE — ED Notes (Signed)
Patient is resting comfortably. 

## 2012-10-15 NOTE — Progress Notes (Signed)
  Echocardiogram 2D Echocardiogram has been performed.  Charlene Garza 10/15/2012, 11:49 AM 

## 2012-10-15 NOTE — Progress Notes (Signed)
UR done. 

## 2012-10-15 NOTE — ED Provider Notes (Signed)
Lab called with troponin of .33 and ECg reviewed NSR rate 82 no STEMI or ST changes.  On eval PT feeling better, was febrile to 101 with rigors and had some RLQ pain and some throat pain now resolved. No ACS symptoms. Case discussed with [facility] cardiology on call, DR Aitsebaomo at 1:54 AM and he recommends admit for possible pericarditis/ myocarditis. Hold heparin. Plan MED admit. Will benefit from ECHO as an inpatient. CT A/P pending. MED c/s for admit.   Sunnie Nielsen, MD 10/15/12 [phone]

## 2012-10-15 NOTE — ED Notes (Signed)
MD at bedside. Admitting dr

## 2012-10-15 NOTE — Progress Notes (Addendum)
Patient ID: Charlene Garza  female  UJW:[id number]    DOB: [birth year]    DOA: 10/14/2012  PCP: Marga Melnick, MD  Subjective: Patient admitted this morning by Dr. Julian Reil. She had presented with fever, chills, rigors, upper back pain, right-sided abdominal pain which has resolved. She denied any chest pain. She had a flu shot 2 weeks ago.   Still feels nauseous. No chest pain or abdominal pain, although per her nurse is still having chills, temperature 101.1 and hypotensive in ED with systolic BP in low 90s last night.   Objective: Weight change:   Intake/Output Summary (Last 24 hours) at 10/15/12 1300 Last data filed at 10/15/12 0900  Gross per 24 hour  Intake   2240 ml  Output      0 ml  Net   2240 ml   Blood pressure 113/95, pulse 82, temperature 98.5 F (36.9 C), temperature source Oral, resp. rate 26, height 5\' 1"  (1.549 m), weight 59.421 kg (131 lb), SpO2 94.00%.  Physical Exam: General: Alert and awake, oriented x3, not in any acute distress. HEENT: anicteric sclera, pupils reactive to light and accommodation, EOMI CVS: S1-S2 clear, no murmur rubs or gallops Chest: clear to auscultation bilaterally, no wheezing, rales or rhonchi Abdomen: soft nontender, nondistended, normal bowel sounds, no organomegaly Extremities: no cyanosis, clubbing or edema noted bilaterally Neuro: Cranial nerves II-XII intact, no focal neurological deficits  Lab Results: Basic Metabolic Panel:  Lab 10/15/12 5621 10/14/12 2229  NA 134* 137  K 3.9 3.6  CL 101 101  CO2 23 24  GLUCOSE 109* 104*  BUN 30* 32*  CREATININE 1.40* 1.31*  CALCIUM 7.8* 8.9  MG -- --  PHOS -- --   Liver Function Tests:  Lab 10/15/12 0500 10/14/12 2229  AST 111* 162*  ALT 90* 119*  ALKPHOS 93 129*  BILITOT 0.3 0.5  PROT 5.4* 6.3  ALBUMIN 2.8* 3.4*    CBC:  Lab 10/15/12 0500 10/14/12 2229  WBC 11.4* 7.8  NEUTROABS -- 7.3  HGB 8.6* 10.0*  HCT 25.1* 28.4*  MCV 92.6 91.9  PLT 110* 123*   Cardiac  Enzymes:  Lab 10/15/12 0500 10/15/12 0251 10/14/12 2230  CKTOTAL -- -- --  CKMB -- -- --  CKMBINDEX -- -- --  TROPONINI 0.52* 0.64* 0.33*   BNP: No components found with this basename: POCBNP:2 CBG:  Lab 10/15/12 0620  GLUCAP 116*     Micro Results: No results found for this or any previous visit (from the past 240 hour(s)).  Studies/Results: Dg Chest 2 View  10/14/2012  *RADIOLOGY REPORT*  Clinical Data: Fever.  Upper chest discomfort.  CHEST - 2 VIEW  Comparison: None.  Findings: Minimal linear atelectasis or scar is seen in the left lung base.  The lungs  are otherwise clear.  There is cardiomegaly. No pneumothorax or pleural fluid.  Remote appearing T8, T9 and T12 compression fracture deformities are noted.  IMPRESSION: No acute finding.  Cardiomegaly.   Original Report Authenticated By: Bernadene Bell. Maricela Curet, M.D.    Ct Abdomen Pelvis W Contrast  10/15/2012  *RADIOLOGY REPORT*  Clinical Data: Right lower quadrant pain.  CT ABDOMEN AND PELVIS WITH CONTRAST  Technique:  Multidetector CT imaging of the abdomen and pelvis was performed following the standard protocol during bolus administration of intravenous contrast.  Contrast: 80mL OMNIPAQUE IOHEXOL 300 MG/ML  SOLN  Comparison: None.  Findings: There is some atelectatic change or scar in the lung bases.  Cardiomegaly is noted.  There is  no pleural or pericardial effusion.  Several small stones identified within the gallbladder but there is no pericholecystic fluid or wall thickening.  Intrahepatic bile ducts are minimally prominent.  The liver is otherwise unremarkable.  The common bile duct and pancreatic duct appear normal.  The spleen, adrenal glands and left kidney are unremarkable.  A right renal cyst measures 3.4 cm in diameter.  Uterus, adnexa and urinary bladder are unremarkable.  A very small amount of free pelvic fluid is identified.  The stomach and small and large bowel and appendix are unremarkable.  Mild superior endplate  compression fractures of T11 and T8 appear remote.  The patient has multilevel degenerative disease in the visualized spine.  No lytic or sclerotic lesion is identified.  IMPRESSION:  1.  Trace amount of free pelvic fluid of uncertain etiology. Question gastroenteritis. The appendix appears normal. 2.  Multiple small gallstones without evidence of cholecystitis. 3.  Minimal prominence of the intrahepatic biliary ducts may be related to the patient's age.  Correlation with liver function test could be used for further evaluation. 4.  Cardiomegaly.   Original Report Authenticated By: Bernadene Bell. Maricela Curet, M.D.     Medications: Scheduled Meds:    . acetaminophen  650 mg Oral Once  . aspirin  324 mg Oral Once  . cefTRIAXone (ROCEPHIN)  IV  1 g Intravenous Q24H  . iohexol  20 mL Oral Q1 Hr x 2  .  morphine injection  6 mg Intravenous Once  . ondansetron (ZOFRAN) IV  4 mg Intravenous Once  . simvastatin  20 mg Oral q1800  . sodium chloride  1,000 mL Intravenous Once  . sodium chloride  500 mL Intravenous Once  . sodium chloride  3 mL Intravenous Q12H  . DISCONTD: aspirin EC  325 mg Oral Daily  . DISCONTD: heparin  3,000 Units Intravenous Once  . DISCONTD: heparin  60 Units/kg Intravenous Once   Continuous Infusions:    . sodium chloride 100 mL/hr at 10/15/12 0539  . DISCONTD: heparin       Assessment/Plan: Principal Problem:  *Elevated troponin: Patient is currently not having any chest pain or shortness of breath - 2-D echo is done, results pending, admitting physician, Dr Julian Reil had discussed with [facility] cardiology who felt that this was more viral syndrome/pericarditis/myocarditis than the ACS.  - I will continue aspirin and statin. Review 2-D echo results, third set of troponin trending down. Cardiology formal consult pending 2-D echo results.  Active Problems:  HYPERLIPIDEMIA: Continue statin    ANEMIA-NOS: Likely hemodilution, patient is not having any bleeding  - Obtain anemia  profile    HYPERTENSION: Patient's still borderline hypotensive, hold off on beta blocker and all case  - Continue IV fluid hydration    Mild  acute renal insufficiency:  - Likely secondary to SIRS/hypotension, ACE- and UTI, continue gentle hydration  - Hold lisinopril   UTI (lower urinary tract infection): Continue IV Rocephin    Cholelithiasis with Transaminitis: - LFTs trending down, no specific abdominal pain or symptoms  - Patient appears to have several gallstones but no acute cholecystitis. She refused to be evaluated by general surgery. She stated that she will discuss with her primary care physician (Dr Alwyn Ren) and take care of it outpatient.   DVT Prophylaxis: SCD's  Code Status: FC  Disposition:   LOS: 1 day   Drinda Belgard M.D. [facility] 10/15/2012, 1:00 PM Pager: [phone]  If 7PM-7AM, please contact night-coverage [email or web address] Password [email or web address]  Addendum: Patient's troponin  has been trending down but still positive, she remains chest pain-free. 2-D echo showed preserved EF. I have ordered a CK, CK-MB, ESR, CRP to assess if it is actually viral myocarditis/pericarditis or atypical ACS. Discussed with [facility] cardiology on call, will follow rec's.    Symphanie Cederberg M.D. [facility] 10/15/2012, 6:14 PM  Pager: [phone]    Addendum: Discussed with Dr Elease Hashimoto, who agrees with above work-up. If she develops Chest pain or rising troponins, will consult cardiology otherwise she will benefit from out-patient stress test for risk stratification.   Quindarrius Joplin M.D. [facility] 10/15/2012, 6:29 PM  Pager: [phone]

## 2012-10-15 NOTE — ED Provider Notes (Signed)
I saw and evaluated the patient, reviewed the resident's note and I agree with the findings and plan.  General illness with tenderness in the right lower quadrant.  CT scan obtained to evaluate for possible right-sided diverticulitis versus appendicitis.  Generalized weakness.  The patient is very well appearing.  Care to Dr. Dierdre Highman for followup on CT scan  Lyanne Co, MD 10/15/12 2218

## 2012-10-15 NOTE — Plan of Care (Signed)
Problem: Phase I Progression Outcomes Goal: OOB as tolerated unless otherwise ordered Outcome: Completed/Met Date Met:  10/15/12 Pt oob ad lib, no c/o SOB, pt independent at home, goal met, will continue to monitor  Goal: Initial discharge plan identified Outcome: Completed/Met Date Met:  10/15/12 Initial plan for d/c is to return home, goal met Goal: Voiding-avoid urinary catheter unless indicated Outcome: Completed/Met Date Met:  10/15/12 Pt voiding adequate amounts in bathroom, no need for foley, goal met

## 2012-10-15 NOTE — ED Notes (Signed)
Pt has no belly tenderness now and bowel sounds present and abdomen soft.  NO fever currently.  No respiratory issues or chest pain.  Pt is NSR on the monitor and admitting MD has seen patient

## 2012-10-16 ENCOUNTER — Inpatient Hospital Stay (HOSPITAL_COMMUNITY): Payer: Medicare Other

## 2012-10-16 DIAGNOSIS — K8020 Calculus of gallbladder without cholecystitis without obstruction: Secondary | ICD-10-CM

## 2012-10-16 DIAGNOSIS — I10 Essential (primary) hypertension: Secondary | ICD-10-CM

## 2012-10-16 DIAGNOSIS — R7989 Other specified abnormal findings of blood chemistry: Secondary | ICD-10-CM

## 2012-10-16 DIAGNOSIS — K8010 Calculus of gallbladder with chronic cholecystitis without obstruction: Secondary | ICD-10-CM

## 2012-10-16 DIAGNOSIS — R109 Unspecified abdominal pain: Secondary | ICD-10-CM

## 2012-10-16 DIAGNOSIS — R7401 Elevation of levels of liver transaminase levels: Secondary | ICD-10-CM

## 2012-10-16 DIAGNOSIS — R7402 Elevation of levels of lactic acid dehydrogenase (LDH): Secondary | ICD-10-CM

## 2012-10-16 LAB — URINE CULTURE: Colony Count: NO GROWTH

## 2012-10-16 LAB — GLUCOSE, CAPILLARY

## 2012-10-16 MED ORDER — FERROUS SULFATE 325 (65 FE) MG PO TABS
325.0000 mg | ORAL_TABLET | Freq: Two times a day (BID) | ORAL | Status: DC
Start: 2012-10-16 — End: 2012-10-19
  Administered 2012-10-16 – 2012-10-19 (×6): 325 mg via ORAL
  Filled 2012-10-16 (×8): qty 1

## 2012-10-16 MED ORDER — SODIUM CHLORIDE 0.9 % IV SOLN
125.0000 mg | Freq: Once | INTRAVENOUS | Status: AC
  Administered 2012-10-16: 125 mg via INTRAVENOUS
  Filled 2012-10-16: qty 10

## 2012-10-16 MED ORDER — LEVOFLOXACIN IN D5W 750 MG/150ML IV SOLN
750.0000 mg | INTRAVENOUS | Status: DC
  Administered 2012-10-16: 750 mg via INTRAVENOUS
  Filled 2012-10-16: qty 150

## 2012-10-16 MED ORDER — ATENOLOL 25 MG PO TABS
25.0000 mg | ORAL_TABLET | Freq: Every day | ORAL | Status: DC
  Administered 2012-10-16 – 2012-10-18 (×3): 25 mg via ORAL
  Filled 2012-10-16 (×4): qty 1

## 2012-10-16 MED ORDER — DEXTROSE 5 % IV SOLN
2.0000 g | INTRAVENOUS | Status: AC
  Administered 2012-10-17: 2 g via INTRAVENOUS
  Filled 2012-10-16: qty 2

## 2012-10-16 MED ORDER — METRONIDAZOLE IN NACL 5-0.79 MG/ML-% IV SOLN
500.0000 mg | Freq: Three times a day (TID) | INTRAVENOUS | Status: DC
  Administered 2012-10-16 – 2012-10-17 (×3): 500 mg via INTRAVENOUS
  Filled 2012-10-16 (×6): qty 100

## 2012-10-16 NOTE — Progress Notes (Signed)
Received results of abdominal u/s via telephone call.  MD has been made aware.  Will continue to monitor. Nino Glow RN

## 2012-10-16 NOTE — Progress Notes (Signed)
As per Lab, patient refused routine a.m. blood works.

## 2012-10-16 NOTE — Progress Notes (Signed)
Patient refused SCD and influenza swab despite of explaining its purpose to her current state.

## 2012-10-16 NOTE — Progress Notes (Signed)
[facility] PROGRESS NOTE  Charlene Garza:[id number] DOB: [birth year] DOA: 10/14/2012 PCP: Marga Melnick, MD  Assessment/Plan: Abdominal pain/transaminase anemia/cholelithiasis -Concerned about cholecystitis -CT abdomen shows cholelithiasis with prominent intrahepatic biliary ducts -Patient had 3 days of nausea/vomiting prior to admission with right shoulder pain and right upper quadrant pain -Consult surgery -Right upper quadrant ultrasound Fever/leukocytosis -Improving on antibiotics -Follow culture data -Change antibiotics to Levaquin and Flagyl -Discontinue ceftriaxone Pyuria -Follow urine culture Elevated troponin -Likely demand ischemia -EKG without any ST-T wave changes suggestive of ischemia -Has returned to normal CKD stage III -Creatinine is at baseline Hyperlipidemia -Discontinue Zocor given elevated liver enzymes     Family Communication:   Discussed with patient daughter     Antibiotics:  Ceftriaxone October 28>>> October 30  Levaquin and Flagyl October 30>>>    Procedures/Studies: Dg Chest 2 View  10/14/2012  *RADIOLOGY REPORT*  Clinical Data: Fever.  Upper chest discomfort.  CHEST - 2 VIEW  Comparison: None.  Findings: Minimal linear atelectasis or scar is seen in the left lung base.  The lungs  are otherwise clear.  There is cardiomegaly. No pneumothorax or pleural fluid.  Remote appearing T8, T9 and T12 compression fracture deformities are noted.  IMPRESSION: No acute finding.  Cardiomegaly.   Original Report Authenticated By: Bernadene Bell. Maricela Curet, M.D.    Ct Abdomen Pelvis W Contrast  10/15/2012  *RADIOLOGY REPORT*  Clinical Data: Right lower quadrant pain.  CT ABDOMEN AND PELVIS WITH CONTRAST  Technique:  Multidetector CT imaging of the abdomen and pelvis was performed following the standard protocol during bolus administration of intravenous contrast.  Contrast: 80mL OMNIPAQUE IOHEXOL 300 MG/ML  SOLN  Comparison: None.  Findings: There  is some atelectatic change or scar in the lung bases.  Cardiomegaly is noted.  There is no pleural or pericardial effusion.  Several small stones identified within the gallbladder but there is no pericholecystic fluid or wall thickening.  Intrahepatic bile ducts are minimally prominent.  The liver is otherwise unremarkable.  The common bile duct and pancreatic duct appear normal.  The spleen, adrenal glands and left kidney are unremarkable.  A right renal cyst measures 3.4 cm in diameter.  Uterus, adnexa and urinary bladder are unremarkable.  A very small amount of free pelvic fluid is identified.  The stomach and small and large bowel and appendix are unremarkable.  Mild superior endplate compression fractures of T11 and T8 appear remote.  The patient has multilevel degenerative disease in the visualized spine.  No lytic or sclerotic lesion is identified.  IMPRESSION:  1.  Trace amount of free pelvic fluid of uncertain etiology. Question gastroenteritis. The appendix appears normal. 2.  Multiple small gallstones without evidence of cholecystitis. 3.  Minimal prominence of the intrahepatic biliary ducts may be related to the patient's age.  Correlation with liver function test could be used for further evaluation. 4.  Cardiomegaly.   Original Report Authenticated By: Bernadene Bell. Maricela Curet, M.D.          Subjective: Patient is feeling better. Her abdominal pain has improved today. She denies any further vomiting. Denies any chest pain, shortness breath, dizziness, syncope, fevers, chills, dysuria.  Objective: Filed Vitals:   10/15/12 2030 10/16/12 0445 10/16/12 0451 10/16/12 0615  BP: 102/53 143/74    Pulse: 71 72    Temp: 99.1 F (37.3 C) 101 F (38.3 C)  98.4 F (36.9 C)  TempSrc: Oral Oral    Resp: 18 20    Height:  Weight:   60.691 kg (133 lb 12.8 oz)   SpO2: 95% 93%      Intake/Output Summary (Last 24 hours) at 10/16/12 1157 Last data filed at 10/16/12 0900  Gross per 24 hour    Intake    600 ml  Output   1000 ml  Net   -400 ml   Weight change: 1.27 kg (2 lb 12.8 oz) Exam:   General:  Pt is alert, follows commands appropriately, not in acute distress  HEENT: No icterus, No thrush,  [place]/AT  Cardiovascular: RRR, S1/S2, no rubs, no gallops  Respiratory: CTA bilaterally, no wheezing, no crackles, no rhonchi  Abdomen: Soft/+BS, non tender, non distended, no guarding  Extremities: Trace edema, No lymphangitis, No petechiae, No rashes, no synovitis  Data Reviewed: Basic Metabolic Panel:  Lab 10/15/12 1610 10/14/12 2229  NA 134* 137  K 3.9 3.6  CL 101 101  CO2 23 24  GLUCOSE 109* 104*  BUN 30* 32*  CREATININE 1.40* 1.31*  CALCIUM 7.8* 8.9  MG -- --  PHOS -- --   Liver Function Tests:  Lab 10/15/12 0500 10/14/12 2229  AST 111* 162*  ALT 90* 119*  ALKPHOS 93 129*  BILITOT 0.3 0.5  PROT 5.4* 6.3  ALBUMIN 2.8* 3.4*   No results found for this basename: LIPASE:5,AMYLASE:5 in the last 168 hours No results found for this basename: AMMONIA:5 in the last 168 hours CBC:  Lab 10/15/12 0500 10/14/12 2229  WBC 11.4* 7.8  NEUTROABS -- 7.3  HGB 8.6* 10.0*  HCT 25.1* 28.4*  MCV 92.6 91.9  PLT 110* 123*   Cardiac Enzymes:  Lab 10/15/12 1857 10/15/12 1856 10/15/12 1604 10/15/12 1207 10/15/12 0500 10/15/12 0251  CKTOTAL -- 147 -- -- -- --  CKMB -- 2.5 -- -- -- --  CKMBINDEX -- -- -- -- -- --  TROPONINI <0.30 -- 0.37* 0.47* 0.52* 0.64*   BNP: No components found with this basename: POCBNP:5 CBG:  Lab 10/15/12 0620  GLUCAP 116*    Recent Results (from the past 240 hour(s))  CULTURE, BLOOD (ROUTINE X 2)     Status: Normal (Preliminary result)   Collection Time   10/15/12  3:59 PM      Component Value Range Status Comment   Specimen Description BLOOD LEFT FOREARM   Final    Special Requests BOTTLES DRAWN AEROBIC AND ANAEROBIC 10CC   Final    Culture  Setup Time 10/15/2012 23:05   Final    Culture     Final    Value:        BLOOD CULTURE  RECEIVED NO GROWTH TO DATE CULTURE WILL BE HELD FOR 5 DAYS BEFORE ISSUING A FINAL NEGATIVE REPORT   Report Status PENDING   Incomplete   CULTURE, BLOOD (ROUTINE X 2)     Status: Normal (Preliminary result)   Collection Time   10/15/12  4:05 PM      Component Value Range Status Comment   Specimen Description BLOOD LEFT FOREARM   Final    Special Requests BOTTLES DRAWN AEROBIC AND ANAEROBIC 10CC   Final    Culture  Setup Time 10/15/2012 23:06   Final    Culture     Final    Value:        BLOOD CULTURE RECEIVED NO GROWTH TO DATE CULTURE WILL BE HELD FOR 5 DAYS BEFORE ISSUING A FINAL NEGATIVE REPORT   Report Status PENDING   Incomplete      Scheduled Meds:   .  aspirin EC  325 mg Oral Daily  . levofloxacin (LEVAQUIN) IV  750 mg Intravenous Q48H  . metronidazole  500 mg Intravenous Q8H  . sodium chloride  3 mL Intravenous Q12H  . DISCONTD: cefTRIAXone (ROCEPHIN)  IV  1 g Intravenous Q24H  . DISCONTD: simvastatin  20 mg Oral q1800   Continuous Infusions:   . sodium chloride 100 mL/hr at 10/16/12 0434     Eron Staat, DO  [facility] Pager [phone]  If 7PM-7AM, please contact night-coverage [email or web address] Password [email or web address] 10/16/2012, 11:57 AM   LOS: 2 days

## 2012-10-16 NOTE — Consult Note (Signed)
Reason for Consult:I was asked by Dr. Arbutus Leas to see this very pleasant 78 year old female with likely: The cystitis related to cholelithiasis.   Referring Physician: Dr. Real Cons is an 78 y.o. female.  HPI: the patient was brought into the hospital 2 days ago with her primary symptoms being chills fever right neck pain and right shoulder pain. She was worked up for possible cardiac condition. She had mildly elevated troponin levels however her EKG is normal. Reviewing her laboratory studies her liver function tests were abnormal. A CT scan was done which showed cholelithiasis and an ultrasound demonstrated cholelithiasis with some thickening of the gallbladder wall but no pericholecystic fluid. A surgical consultation was obtained.  Past Medical History  Diagnosis Date  . Hypertension   . Anemia   . Hyperlipidemia   . Osteopenia     T score : - 2.4 @ R femoral neck  . Renal insufficiency   . Arthritis     Past Surgical History  Procedure Date  . Cataract extraction     bilaterally  . Knee surgery 10/2003    L TKR    Family History  Problem Relation Age of Onset  . Hypertension Father   . Hypertension Mother   . Heart attack Mother 83  . Hypertension Brother   . Diabetes Neg Hx   . Stroke Neg Hx     Social History:  reports that she has never smoked. She does not have any smokeless tobacco history on file. She reports that she drinks alcohol. She reports that she does not use illicit drugs.  Allergies: No Known Allergies  Medications:  I have reviewed the patient's current medications. Scheduled:   . aspirin EC  325 mg Oral Daily  . ferric gluconate (FERRLECIT/NULECIT) IV  125 mg Intravenous Once  . ferrous sulfate  325 mg Oral BID WC  . levofloxacin (LEVAQUIN) IV  750 mg Intravenous Q48H  . metronidazole  500 mg Intravenous Q8H  . sodium chloride  3 mL Intravenous Q12H  . DISCONTD: cefTRIAXone (ROCEPHIN)  IV  1 g Intravenous Q24H  . DISCONTD: simvastatin   20 mg Oral q1800   AVW:[id number], HYDROmorphone (DILAUDID) injection, ondansetron, oxyCODONE-acetaminophen  Results for orders placed during the hospital encounter of 10/14/12 (from the past 48 hour(s))  CBC WITH DIFFERENTIAL     Status: Abnormal   Collection Time   10/14/12 10:29 PM      Component Value Range Comment   WBC 7.8  4.0 - 10.5 K/uL    RBC 3.09 (*) 3.87 - 5.11 MIL/uL    Hemoglobin 10.0 (*) 12.0 - 15.0 g/dL    HCT 62.1 (*) 30.8 - 46.0 %    MCV 91.9  78.0 - 100.0 fL    MCH 32.4  26.0 - 34.0 pg    MCHC 35.2  30.0 - 36.0 g/dL    RDW 65.7  84.6 - 96.2 %    Platelets 123 (*) 150 - 400 K/uL    Neutrophils Relative 93 (*) 43 - 77 %    Neutro Abs 7.3  1.7 - 7.7 K/uL    Lymphocytes Relative 3 (*) 12 - 46 %    Lymphs Abs 0.3 (*) 0.7 - 4.0 K/uL    Monocytes Relative 3  3 - 12 %    Monocytes Absolute 0.2  0.1 - 1.0 K/uL    Eosinophils Relative 0  0 - 5 %    Eosinophils Absolute 0.0  0.0 - 0.7 K/uL  Basophils Relative 0  0 - 1 %    Basophils Absolute 0.0  0.0 - 0.1 K/uL   COMPREHENSIVE METABOLIC PANEL     Status: Abnormal   Collection Time   10/14/12 10:29 PM      Component Value Range Comment   Sodium 137  135 - 145 mEq/L    Potassium 3.6  3.5 - 5.1 mEq/L    Chloride 101  96 - 112 mEq/L    CO2 24  19 - 32 mEq/L    Glucose, Bld 104 (*) 70 - 99 mg/dL    BUN 32 (*) 6 - 23 mg/dL    Creatinine, Ser 1.61 (*) 0.50 - 1.10 mg/dL    Calcium 8.9  8.4 - 09.6 mg/dL    Total Protein 6.3  6.0 - 8.3 g/dL    Albumin 3.4 (*) 3.5 - 5.2 g/dL    AST 045 (*) 0 - 37 U/L    ALT 119 (*) 0 - 35 U/L    Alkaline Phosphatase 129 (*) 39 - 117 U/L    Total Bilirubin 0.5  0.3 - 1.2 mg/dL    GFR calc non Af Amer 37 (*) >90 mL/min    GFR calc Af Amer 43 (*) >90 mL/min   TROPONIN I     Status: Abnormal   Collection Time   10/14/12 10:30 PM      Component Value Range Comment   Troponin I 0.33 (*) <0.30 ng/mL   URINALYSIS, ROUTINE W REFLEX MICROSCOPIC     Status: Abnormal   Collection Time    10/15/12 12:55 AM      Component Value Range Comment   Color, Urine YELLOW  YELLOW    APPearance CLOUDY (*) CLEAR    Specific Gravity, Urine 1.016  1.005 - 1.030    pH 6.5  5.0 - 8.0    Glucose, UA NEGATIVE  NEGATIVE mg/dL    Hgb urine dipstick MODERATE (*) NEGATIVE    Bilirubin Urine NEGATIVE  NEGATIVE    Ketones, ur NEGATIVE  NEGATIVE mg/dL    Protein, ur 409 (*) NEGATIVE mg/dL    Urobilinogen, UA 0.2  0.0 - 1.0 mg/dL    Nitrite NEGATIVE  NEGATIVE    Leukocytes, UA MODERATE (*) NEGATIVE   URINE MICROSCOPIC-ADD ON     Status: Abnormal   Collection Time   10/15/12 12:55 AM      Component Value Range Comment   Squamous Epithelial / LPF RARE  RARE    WBC, UA 21-50  <3 WBC/hpf    RBC / HPF 7-10  <3 RBC/hpf    Bacteria, UA FEW (*) RARE    Urine-Other MUCOUS PRESENT     INFLUENZA PANEL BY PCR     Status: Normal   Collection Time   10/15/12 12:58 AM      Component Value Range Comment   Influenza A By PCR NEGATIVE  NEGATIVE    Influenza B By PCR NEGATIVE  NEGATIVE    H1N1 flu by pcr NOT DETECTED  NOT DETECTED   TROPONIN I     Status: Abnormal   Collection Time   10/15/12  2:51 AM      Component Value Range Comment   Troponin I 0.64 (*) <0.30 ng/mL   LACTIC ACID, PLASMA     Status: Normal   Collection Time   10/15/12  2:52 AM      Component Value Range Comment   Lactic Acid, Venous 0.9  0.5 - 2.2 mmol/L  TROPONIN I     Status: Abnormal   Collection Time   10/15/12  5:00 AM      Component Value Range Comment   Troponin I 0.52 (*) <0.30 ng/mL   CBC     Status: Abnormal   Collection Time   10/15/12  5:00 AM      Component Value Range Comment   WBC 11.4 (*) 4.0 - 10.5 K/uL    RBC 2.71 (*) 3.87 - 5.11 MIL/uL    Hemoglobin 8.6 (*) 12.0 - 15.0 g/dL    HCT 14.7 (*) 82.9 - 46.0 %    MCV 92.6  78.0 - 100.0 fL    MCH 31.7  26.0 - 34.0 pg    MCHC 34.3  30.0 - 36.0 g/dL    RDW 56.2  13.0 - 86.5 %    Platelets 110 (*) 150 - 400 K/uL   COMPREHENSIVE METABOLIC PANEL     Status:  Abnormal   Collection Time   10/15/12  5:00 AM      Component Value Range Comment   Sodium 134 (*) 135 - 145 mEq/L    Potassium 3.9  3.5 - 5.1 mEq/L    Chloride 101  96 - 112 mEq/L    CO2 23  19 - 32 mEq/L    Glucose, Bld 109 (*) 70 - 99 mg/dL    BUN 30 (*) 6 - 23 mg/dL    Creatinine, Ser 7.84 (*) 0.50 - 1.10 mg/dL    Calcium 7.8 (*) 8.4 - 10.5 mg/dL    Total Protein 5.4 (*) 6.0 - 8.3 g/dL    Albumin 2.8 (*) 3.5 - 5.2 g/dL    AST 696 (*) 0 - 37 U/L    ALT 90 (*) 0 - 35 U/L    Alkaline Phosphatase 93  39 - 117 U/L    Total Bilirubin 0.3  0.3 - 1.2 mg/dL    GFR calc non Af Amer 34 (*) >90 mL/min    GFR calc Af Amer 40 (*) >90 mL/min   RETICULOCYTES     Status: Abnormal   Collection Time   10/15/12  5:00 AM      Component Value Range Comment   Retic Ct Pct 1.5  0.4 - 3.1 %    RBC. 2.68 (*) 3.87 - 5.11 MIL/uL    Retic Count, Manual 40.2  19.0 - 186.0 K/uL   GLUCOSE, CAPILLARY     Status: Abnormal   Collection Time   10/15/12  6:20 AM      Component Value Range Comment   Glucose-Capillary 116 (*) 70 - 99 mg/dL   TROPONIN I     Status: Abnormal   Collection Time   10/15/12 12:07 PM      Component Value Range Comment   Troponin I 0.47 (*) <0.30 ng/mL   VITAMIN B12     Status: Normal   Collection Time   10/15/12 12:07 PM      Component Value Range Comment   Vitamin B-12 334  211 - 911 pg/mL   IRON AND TIBC     Status: Abnormal   Collection Time   10/15/12 12:07 PM      Component Value Range Comment   Iron 10 (*) 42 - 135 ug/dL    TIBC 295  284 - 132 ug/dL    Saturation Ratios 4 (*) 20 - 55 %    UIBC 260  125 - 400 ug/dL   FERRITIN     Status:  Normal   Collection Time   10/15/12 12:07 PM      Component Value Range Comment   Ferritin 152  10 - 291 ng/mL   CULTURE, BLOOD (ROUTINE X 2)     Status: Normal (Preliminary result)   Collection Time   10/15/12  3:59 PM      Component Value Range Comment   Specimen Description BLOOD LEFT FOREARM      Special Requests BOTTLES  DRAWN AEROBIC AND ANAEROBIC 10CC      Culture  Setup Time 10/15/2012 23:05      Culture        Value:        BLOOD CULTURE RECEIVED NO GROWTH TO DATE CULTURE WILL BE HELD FOR 5 DAYS BEFORE ISSUING A FINAL NEGATIVE REPORT   Report Status PENDING     TROPONIN I     Status: Abnormal   Collection Time   10/15/12  4:04 PM      Component Value Range Comment   Troponin I 0.37 (*) <0.30 ng/mL   CULTURE, BLOOD (ROUTINE X 2)     Status: Normal (Preliminary result)   Collection Time   10/15/12  4:05 PM      Component Value Range Comment   Specimen Description BLOOD LEFT FOREARM      Special Requests BOTTLES DRAWN AEROBIC AND ANAEROBIC 10CC      Culture  Setup Time 10/15/2012 23:06      Culture        Value:        BLOOD CULTURE RECEIVED NO GROWTH TO DATE CULTURE WILL BE HELD FOR 5 DAYS BEFORE ISSUING A FINAL NEGATIVE REPORT   Report Status PENDING     CK TOTAL AND CKMB     Status: Normal   Collection Time   10/15/12  6:56 PM      Component Value Range Comment   Total CK 147  7 - 177 U/L    CK, MB 2.5  0.3 - 4.0 ng/mL    Relative Index 1.7  0.0 - 2.5   TROPONIN I     Status: Normal   Collection Time   10/15/12  6:57 PM      Component Value Range Comment   Troponin I <0.30  <0.30 ng/mL   SEDIMENTATION RATE     Status: Abnormal   Collection Time   10/15/12  6:57 PM      Component Value Range Comment   Sed Rate 38 (*) 0 - 22 mm/hr   C-REACTIVE PROTEIN     Status: Abnormal   Collection Time   10/15/12  6:57 PM      Component Value Range Comment   CRP 14.0 (*) <0.60 mg/dL   GLUCOSE, CAPILLARY     Status: Abnormal   Collection Time   10/16/12  7:56 AM      Component Value Range Comment   Glucose-Capillary 100 (*) 70 - 99 mg/dL    Comment 1 Documented in Chart      Comment 2 Notify RN       Dg Chest 2 View  10/14/2012  *RADIOLOGY REPORT*  Clinical Data: Fever.  Upper chest discomfort.  CHEST - 2 VIEW  Comparison: None.  Findings: Minimal linear atelectasis or scar is seen in the  left lung base.  The lungs  are otherwise clear.  There is cardiomegaly. No pneumothorax or pleural fluid.  Remote appearing T8, T9 and T12 compression fracture deformities are noted.  IMPRESSION: No acute finding.  Cardiomegaly.   Original Report Authenticated By: Bernadene Bell. Maricela Curet, M.D.    US Abdomen Complete  10/16/2012  *RADIOLOGY REPORT*  Clinical Data:  Abnormal CT.  Right lower quadrant pain.  COMPLETE ABDOMINAL ULTRASOUND  Comparison:  10/15/2012 CT.  Findings:  Gallbladder:  Multiple gallstones measuring up to 1.4 cm.  Stone within the gallbladder neck does not change position with patient position.  Gallbladder wall thickness 4.2 mm.  The patient was not tender over this region during scanning per ultrasound technologist.  Common bile duct:  8.1 mm.  Distal aspect not visualized secondary to bowel gas.  There may be sludge within the common bile duct.  Liver:  No focal liver lesion.  IVC:  Appears normal.  Pancreas:  No focal abnormality seen.  Spleen:  5 cm without focal mass.  Right Kidney:  11.6 cm.  No hydronephrosis.  Lower pole 3.6 cm minimally complex cyst.  Left Kidney:  9.7 cm.  No hydronephrosis.  Sub centimeter lesion too small to characterize as a simple cyst.  Abdominal aorta:  Calcified with ectasia.  Portions not well delineated secondary to bowel gas.  No obvious abdominal aortic aneurysm.  Trace ascites adjacent to the liver.  IMPRESSION: Multiple gallstones measuring up to 1.4 cm.  Stone within the gallbladder neck does not change position with patient position. Gallbladder wall thickened measuring 4.2 mm.  The patient was not tender over this region during scanning per ultrasound technologist.  Common bile duct measures up to 8.1 mm.  There may be sludge within the common bile duct.  Trace ascites adjacent to the liver.  This has been made a PRA call report utilizing dashboard call feature.   Original Report Authenticated By: Fuller Canada, M.D.    Ct Abdomen Pelvis W  Contrast  10/15/2012  *RADIOLOGY REPORT*  Clinical Data: Right lower quadrant pain.  CT ABDOMEN AND PELVIS WITH CONTRAST  Technique:  Multidetector CT imaging of the abdomen and pelvis was performed following the standard protocol during bolus administration of intravenous contrast.  Contrast: 80mL OMNIPAQUE IOHEXOL 300 MG/ML  SOLN  Comparison: None.  Findings: There is some atelectatic change or scar in the lung bases.  Cardiomegaly is noted.  There is no pleural or pericardial effusion.  Several small stones identified within the gallbladder but there is no pericholecystic fluid or wall thickening.  Intrahepatic bile ducts are minimally prominent.  The liver is otherwise unremarkable.  The common bile duct and pancreatic duct appear normal.  The spleen, adrenal glands and left kidney are unremarkable.  A right renal cyst measures 3.4 cm in diameter.  Uterus, adnexa and urinary bladder are unremarkable.  A very small amount of free pelvic fluid is identified.  The stomach and small and large bowel and appendix are unremarkable.  Mild superior endplate compression fractures of T11 and T8 appear remote.  The patient has multilevel degenerative disease in the visualized spine.  No lytic or sclerotic lesion is identified.  IMPRESSION:  1.  Trace amount of free pelvic fluid of uncertain etiology. Question gastroenteritis. The appendix appears normal. 2.  Multiple small gallstones without evidence of cholecystitis. 3.  Minimal prominence of the intrahepatic biliary ducts may be related to the patient's age.  Correlation with liver function test could be used for further evaluation. 4.  Cardiomegaly.   Original Report Authenticated By: Bernadene Bell. Maricela Curet, M.D.     Review of Systems  Constitutional: Positive for chills.  HENT: Positive for neck pain.   Eyes: Negative.  Respiratory: Negative.   Cardiovascular: Negative.   Gastrointestinal: Positive for nausea and vomiting. Negative for abdominal pain.   Neurological: Negative.   Endo/Heme/Allergies: Negative.   Psychiatric/Behavioral: Negative.    Blood pressure 143/74, pulse 72, temperature 98.4 F (36.9 C), temperature source Oral, resp. rate 20, height 5\' 1"  (1.549 m), weight 60.691 kg (133 lb 12.8 oz), SpO2 93.00%. Physical Exam  Constitutional: She is oriented to person, place, and time. She appears well-developed and well-nourished.  HENT:  Head: Normocephalic and atraumatic.  Eyes: Conjunctivae normal and EOM are normal. Pupils are equal, round, and reactive to light.  Neck: Normal range of motion.  Cardiovascular: Normal rate, regular rhythm and normal heart sounds.   Respiratory: Effort normal and breath sounds normal.  GI: Soft. She exhibits no distension and no mass. There is no tenderness. There is no rebound and no guarding.  Musculoskeletal: Normal range of motion.  Neurological: She is alert and oriented to person, place, and time. She has normal reflexes.  Skin: Skin is warm and dry.  Psychiatric: She has a normal mood and affect. Her behavior is normal. Judgment and thought content normal.    Assessment/Plan: #1: Cholelithiasis with abnormal liver function tests possible cholecystitis. Although the patient's symptoms are referred to her right neck and right shoulder this possibly is referred pain to her liver and right diaphragm. This is an unusual presentation since the patient has absolutely no abdominal pain no back pain and no right upper quadrant abdominal pain.  #2: History of chronic renal disease however this comes from the patient's abnormal UN and creatinine on admission. This possibly secondary to dehydration from nausea vomiting secondary to this current illness. She has no prior history of kidney disease through her primary care physician.  #3: Possible cardiac problem is less likely to be from an acute myocardial infarction more likely to be from stress tachycardia secondary to in her acute illness. It does  not appears old the patient had an acute myocardial infarction.  #4 chronic anemia: The patient has anemia for years. She has never been transfused. She takes Vitamins but cannot tolerate oral iron. Perhaps her anemia has been exacerbated by the amount of IV fluids that she is received in the hospital currently her hemoglobin is down to 8.6.  Plan:  The patient is otherwise very healthy. She has history of hypertension and chronic anemia. However in spite of these chronic problems the patient is in amazingly good health and should be able to tolerate a cholecystectomy. Her liver function tests are more abnormal and therefore she will likely require a cholangiogram at the time of her gallbladder removal.  I explained to the patient and her daughter the possibility of a postoperative ERCP being needed. I also explained to him that it would best if she had her gallbladder removed during this admission. They understand this and are considering going ahead with surgery. I told him that the physician covering our service for the week will be by to see her tomorrow prior to surgery. That is Dr. Axel Filler.  I will go ahead and get the patient scheduled for surgery. I will write for her to get some preoperative antibiotics. She will be n.p.o. After midnight.  Cherylynn Ridges 10/16/2012, 7:01 PM

## 2012-10-17 ENCOUNTER — Encounter (HOSPITAL_COMMUNITY): Payer: Self-pay | Admitting: Certified Registered Nurse Anesthetist

## 2012-10-17 ENCOUNTER — Inpatient Hospital Stay (HOSPITAL_COMMUNITY): Payer: Medicare Other | Admitting: Anesthesiology

## 2012-10-17 ENCOUNTER — Encounter (HOSPITAL_COMMUNITY): Payer: Self-pay | Admitting: Anesthesiology

## 2012-10-17 ENCOUNTER — Inpatient Hospital Stay (HOSPITAL_COMMUNITY): Payer: Medicare Other

## 2012-10-17 ENCOUNTER — Encounter (HOSPITAL_COMMUNITY)
Admission: EM | Disposition: A | Discharge status: Home / Self Care | Payer: Self-pay | Source: Home / Self Care | Attending: Internal Medicine

## 2012-10-17 DIAGNOSIS — N390 Urinary tract infection, site not specified: Secondary | ICD-10-CM

## 2012-10-17 HISTORY — PX: CHOLECYSTECTOMY: SHX55

## 2012-10-17 LAB — VITAMIN B12: Vitamin B-12: 334 pg/mL (ref 211–911)

## 2012-10-17 LAB — URINE CULTURE: Colony Count: >=100000

## 2012-10-17 LAB — FOLATE: Folate: 13 ng/mL

## 2012-10-17 SURGERY — LAPAROSCOPIC CHOLECYSTECTOMY WITH INTRAOPERATIVE CHOLANGIOGRAM
Anesthesia: General | Site: Abdomen | Wound class: Clean Contaminated

## 2012-10-17 MED ORDER — BUPIVACAINE HCL 0.25 % IJ SOLN
INTRAMUSCULAR | Status: DC | PRN
  Administered 2012-10-17: 10 mL

## 2012-10-17 MED ORDER — ROCURONIUM BROMIDE 100 MG/10ML IV SOLN
INTRAVENOUS | Status: DC | PRN
  Administered 2012-10-17: 35 mg via INTRAVENOUS

## 2012-10-17 MED ORDER — FENTANYL CITRATE 0.05 MG/ML IJ SOLN
INTRAMUSCULAR | Status: DC | PRN
  Administered 2012-10-17: 75 ug via INTRAVENOUS
  Administered 2012-10-17: 100 ug via INTRAVENOUS

## 2012-10-17 MED ORDER — BUPIVACAINE HCL (PF) 0.25 % IJ SOLN
INTRAMUSCULAR | Status: AC
  Filled 2012-10-17: qty 30

## 2012-10-17 MED ORDER — SCOPOLAMINE 1 MG/3DAYS TD PT72
MEDICATED_PATCH | TRANSDERMAL | Status: AC
  Filled 2012-10-17: qty 1

## 2012-10-17 MED ORDER — ONDANSETRON HCL 4 MG/2ML IJ SOLN
INTRAMUSCULAR | Status: DC | PRN
  Administered 2012-10-17 (×2): 4 mg via INTRAVENOUS

## 2012-10-17 MED ORDER — LIDOCAINE HCL (CARDIAC) 20 MG/ML IV SOLN
INTRAVENOUS | Status: DC | PRN
  Administered 2012-10-17: 100 mg via INTRAVENOUS

## 2012-10-17 MED ORDER — LACTATED RINGERS IV SOLN
INTRAVENOUS | Status: DC | PRN
  Administered 2012-10-17 (×2): via INTRAVENOUS

## 2012-10-17 MED ORDER — HYDROMORPHONE HCL PF 1 MG/ML IJ SOLN
0.2500 mg | INTRAMUSCULAR | Status: DC | PRN
  Administered 2012-10-17 (×2): 0.5 mg via INTRAVENOUS

## 2012-10-17 MED ORDER — 0.9 % SODIUM CHLORIDE (POUR BTL) OPTIME
TOPICAL | Status: DC | PRN
  Administered 2012-10-17: 1000 mL

## 2012-10-17 MED ORDER — HYDROMORPHONE HCL PF 1 MG/ML IJ SOLN
0.5000 mg | INTRAMUSCULAR | Status: DC | PRN
  Filled 2012-10-17: qty 1

## 2012-10-17 MED ORDER — NEOSTIGMINE METHYLSULFATE 1 MG/ML IJ SOLN
INTRAMUSCULAR | Status: DC | PRN
  Administered 2012-10-17: 4 mg via INTRAVENOUS

## 2012-10-17 MED ORDER — OXYCODONE HCL 5 MG PO TABS
5.0000 mg | ORAL_TABLET | Freq: Once | ORAL | Status: AC | PRN
  Administered 2012-10-17: 5 mg via ORAL

## 2012-10-17 MED ORDER — PROPOFOL 10 MG/ML IV BOLUS
INTRAVENOUS | Status: DC | PRN
  Administered 2012-10-17: 150 mg via INTRAVENOUS

## 2012-10-17 MED ORDER — ZOLPIDEM TARTRATE 5 MG PO TABS
5.0000 mg | ORAL_TABLET | Freq: Every evening | ORAL | Status: DC | PRN
  Administered 2012-10-17: 5 mg via ORAL
  Filled 2012-10-17: qty 1

## 2012-10-17 MED ORDER — SODIUM CHLORIDE 0.9 % IR SOLN
Status: DC | PRN
  Administered 2012-10-17: 1

## 2012-10-17 MED ORDER — GLYCOPYRROLATE 0.2 MG/ML IJ SOLN
INTRAMUSCULAR | Status: DC | PRN
  Administered 2012-10-17: 0.6 mg via INTRAVENOUS

## 2012-10-17 MED ORDER — DEXAMETHASONE SODIUM PHOSPHATE 4 MG/ML IJ SOLN
INTRAMUSCULAR | Status: DC | PRN
  Administered 2012-10-17: 4 mg via INTRAVENOUS

## 2012-10-17 MED ORDER — IOHEXOL 300 MG/ML  SOLN
INTRAMUSCULAR | Status: DC | PRN
  Administered 2012-10-17: 11 mL via INTRAVENOUS

## 2012-10-17 MED ORDER — PROMETHAZINE HCL 25 MG/ML IJ SOLN: 6.2500 mg | INTRAMUSCULAR | Status: DC | PRN

## 2012-10-17 MED ORDER — SCOPOLAMINE 1 MG/3DAYS TD PT72
MEDICATED_PATCH | TRANSDERMAL | Status: DC | PRN
  Administered 2012-10-17: 1 via TRANSDERMAL

## 2012-10-17 MED ORDER — OXYCODONE HCL 5 MG/5ML PO SOLN: 5.0000 mg | Freq: Once | ORAL | Status: AC | PRN

## 2012-10-17 SURGICAL SUPPLY — 43 items
APL SKNCLS STERI-STRIP NONHPOA (GAUZE/BANDAGES/DRESSINGS) ×1
APPLIER CLIP 5 13 M/L LIGAMAX5 (MISCELLANEOUS) ×2
BENZOIN TINCTURE PRP APPL 2/3 (GAUZE/BANDAGES/DRESSINGS) ×2 IMPLANT
CANISTER SUCTION 2500CC (MISCELLANEOUS) ×2 IMPLANT
CHLORAPREP W/TINT 26ML (MISCELLANEOUS) ×2 IMPLANT
CLIP APPLIE 5 13 M/L LIGAMAX5 (MISCELLANEOUS) ×1 IMPLANT
CLOTH BEACON ORANGE TIMEOUT ST (SAFETY) ×2 IMPLANT
COVER MAYO STAND STRL (DRAPES) ×2 IMPLANT
COVER SURGICAL LIGHT HANDLE (MISCELLANEOUS) ×2 IMPLANT
DECANTER SPIKE VIAL GLASS SM (MISCELLANEOUS) IMPLANT
DEVICE TROCAR PUNCTURE CLOSURE (ENDOMECHANICALS) ×2 IMPLANT
DRAPE C-ARM 42X72 X-RAY (DRAPES) ×2 IMPLANT
DRAPE UTILITY XL STRL (DRAPES) ×4 IMPLANT
ELECT REM PT RETURN 9FT ADLT (ELECTROSURGICAL) ×2
ELECTRODE REM PT RTRN 9FT ADLT (ELECTROSURGICAL) ×1 IMPLANT
GAUZE SPONGE 2X2 8PLY STRL LF (GAUZE/BANDAGES/DRESSINGS) ×1 IMPLANT
GLOVE BIO SURGEON STRL SZ7.5 (GLOVE) ×6 IMPLANT
GLOVE BIOGEL PI IND STRL 7.5 (GLOVE) ×3 IMPLANT
GLOVE BIOGEL PI IND STRL 8 (GLOVE) ×1 IMPLANT
GLOVE BIOGEL PI INDICATOR 7.5 (GLOVE) ×3
GLOVE BIOGEL PI INDICATOR 8 (GLOVE) ×1
GLOVE ECLIPSE 7.0 STRL STRAW (GLOVE) ×2 IMPLANT
GLOVE ECLIPSE 7.5 STRL STRAW (GLOVE) ×2 IMPLANT
GOWN STRL NON-REIN LRG LVL3 (GOWN DISPOSABLE) ×8 IMPLANT
IV CATH 14GX2 1/4 (CATHETERS) ×2 IMPLANT
KIT BASIN OR (CUSTOM PROCEDURE TRAY) ×2 IMPLANT
KIT ROOM TURNOVER OR (KITS) ×2 IMPLANT
NEEDLE INSUFFLATION 14GA 120MM (NEEDLE) ×2 IMPLANT
NS IRRIG 1000ML POUR BTL (IV SOLUTION) ×2 IMPLANT
PAD ARMBOARD 7.5X6 YLW CONV (MISCELLANEOUS) ×4 IMPLANT
POUCH SPECIMEN RETRIEVAL 10MM (ENDOMECHANICALS) ×2 IMPLANT
SCISSORS LAP 5X35 DISP (ENDOMECHANICALS) ×2 IMPLANT
SET CHOLANGIOGRAPHY FRANKLIN (SET/KITS/TRAYS/PACK) ×4 IMPLANT
SET IRRIG TUBING LAPAROSCOPIC (IRRIGATION / IRRIGATOR) ×2 IMPLANT
SLEEVE ENDOPATH XCEL 5M (ENDOMECHANICALS) ×4 IMPLANT
SPECIMEN JAR SMALL (MISCELLANEOUS) IMPLANT
SPONGE GAUZE 2X2 STER 10/PKG (GAUZE/BANDAGES/DRESSINGS) ×1
SUT MNCRL AB 3-0 PS2 18 (SUTURE) ×4 IMPLANT
TOWEL OR 17X24 6PK STRL BLUE (TOWEL DISPOSABLE) ×2 IMPLANT
TOWEL OR 17X26 10 PK STRL BLUE (TOWEL DISPOSABLE) ×2 IMPLANT
TRAY LAPAROSCOPIC (CUSTOM PROCEDURE TRAY) ×2 IMPLANT
TROCAR XCEL NON-BLD 11X100MML (ENDOMECHANICALS) ×2 IMPLANT
TROCAR XCEL NON-BLD 5MMX100MML (ENDOMECHANICALS) ×2 IMPLANT

## 2012-10-17 NOTE — Progress Notes (Signed)
Spoke with Dr. Arbutus Leas about patient refusing OR and lab work.  Dr. Arbutus Leas advised RN to pass it on to surgeon.  Oncoming RN, Dellie Burns made aware. Louretta Parma, RN

## 2012-10-17 NOTE — Plan of Care (Signed)
Problem: Phase II Progression Outcomes Goal: Vital signs remain stable Outcome: Completed/Met Date Met:  10/17/12 pts vss, no c/o sob, goal met, will monitor Goal: Other Phase II Outcomes/Goals Outcome: Completed/Met Date Met:  10/17/12 Pt had lap choley today, pt did well, pt having some gas pains, pt educated on moving around to help relieve gas, pt drinking water for dry mouth, pt doing well, will continue to monitor

## 2012-10-17 NOTE — Progress Notes (Signed)
[facility] PROGRESS NOTE  PRAPTI GRUSSING ZOX:[id number] DOB: January 15, 1930 DOA: 10/14/2012 PCP: Marga Melnick, MD  Assessment/Plan: Abdominal pain/transaminase anemia/cholelithiasis  -Concerned about cholecystitis  -Appreciate surgical evaluation, plans noted for cholecystectomy -CT abdomen shows cholelithiasis with prominent intrahepatic biliary ducts  -Abdominal ultrasound showed gallbladder wall thickening 4.2 cm with common bile duct 8.1 mm -Patient had 3 days of nausea/vomiting prior to admission with right shoulder pain and right upper quadrant pain  -Continue intravenous levofloxacin and Flagyl -Patient refused her blood work this morning, states that she will reconsider in the morning Fever/leukocytosis  -Improving on antibiotics  -Follow culture data  -Continue Levaquin and Flagyl  -Discontinued ceftriaxone  - influenza PCR negative Urinary tract infection -Follow urine culture--gram-negative rods -Continue levofloxacin pending susceptibilities Elevated troponin  -Likely demand ischemia  -EKG without any ST-T wave changes suggestive of ischemia  -Has returned to normal  CKD stage III  -Creatinine is at baseline  Hyperlipidemia  -Discontinue Zocor given elevated liver enzymes Thrombocytopenia -Likely due to sepsis from her cholecystitis -Continue to monitor     Family Communication:   Updated daughter Disposition Plan:   Home when medically stable    Antibiotics:  Ceftriaxone October 28>>> October 30  Levaquin October 30>>>  Flagyl October 30>>>    Procedures/Studies: Dg Chest 2 View  10/14/2012  *RADIOLOGY REPORT*  Clinical Data: Fever.  Upper chest discomfort.  CHEST - 2 VIEW  Comparison: None.  Findings: Minimal linear atelectasis or scar is seen in the left lung base.  The lungs  are otherwise clear.  There is cardiomegaly. No pneumothorax or pleural fluid.  Remote appearing T8, T9 and T12 compression fracture deformities are noted.   IMPRESSION: No acute finding.  Cardiomegaly.   Original Report Authenticated By: Bernadene Bell. Maricela Curet, M.D.    US Abdomen Complete  10/16/2012  *RADIOLOGY REPORT*  Clinical Data:  Abnormal CT.  Right lower quadrant pain.  COMPLETE ABDOMINAL ULTRASOUND  Comparison:  10/15/2012 CT.  Findings:  Gallbladder:  Multiple gallstones measuring up to 1.4 cm.  Stone within the gallbladder neck does not change position with patient position.  Gallbladder wall thickness 4.2 mm.  The patient was not tender over this region during scanning per ultrasound technologist.  Common bile duct:  8.1 mm.  Distal aspect not visualized secondary to bowel gas.  There may be sludge within the common bile duct.  Liver:  No focal liver lesion.  IVC:  Appears normal.  Pancreas:  No focal abnormality seen.  Spleen:  5 cm without focal mass.  Right Kidney:  11.6 cm.  No hydronephrosis.  Lower pole 3.6 cm minimally complex cyst.  Left Kidney:  9.7 cm.  No hydronephrosis.  Sub centimeter lesion too small to characterize as a simple cyst.  Abdominal aorta:  Calcified with ectasia.  Portions not well delineated secondary to bowel gas.  No obvious abdominal aortic aneurysm.  Trace ascites adjacent to the liver.  IMPRESSION: Multiple gallstones measuring up to 1.4 cm.  Stone within the gallbladder neck does not change position with patient position. Gallbladder wall thickened measuring 4.2 mm.  The patient was not tender over this region during scanning per ultrasound technologist.  Common bile duct measures up to 8.1 mm.  There may be sludge within the common bile duct.  Trace ascites adjacent to the liver.  This has been made a PRA call report utilizing dashboard call feature.   Original Report Authenticated By: Fuller Canada, M.D.    Ct Abdomen Pelvis W Contrast  10/15/2012  *RADIOLOGY REPORT*  Clinical Data: Right lower quadrant pain.  CT ABDOMEN AND PELVIS WITH CONTRAST  Technique:  Multidetector CT imaging of the abdomen and pelvis was  performed following the standard protocol during bolus administration of intravenous contrast.  Contrast: 80mL OMNIPAQUE IOHEXOL 300 MG/ML  SOLN  Comparison: None.  Findings: There is some atelectatic change or scar in the lung bases.  Cardiomegaly is noted.  There is no pleural or pericardial effusion.  Several small stones identified within the gallbladder but there is no pericholecystic fluid or wall thickening.  Intrahepatic bile ducts are minimally prominent.  The liver is otherwise unremarkable.  The common bile duct and pancreatic duct appear normal.  The spleen, adrenal glands and left kidney are unremarkable.  A right renal cyst measures 3.4 cm in diameter.  Uterus, adnexa and urinary bladder are unremarkable.  A very small amount of free pelvic fluid is identified.  The stomach and small and large bowel and appendix are unremarkable.  Mild superior endplate compression fractures of T11 and T8 appear remote.  The patient has multilevel degenerative disease in the visualized spine.  No lytic or sclerotic lesion is identified.  IMPRESSION:  1.  Trace amount of free pelvic fluid of uncertain etiology. Question gastroenteritis. The appendix appears normal. 2.  Multiple small gallstones without evidence of cholecystitis. 3.  Minimal prominence of the intrahepatic biliary ducts may be related to the patient's age.  Correlation with liver function test could be used for further evaluation. 4.  Cardiomegaly.   Original Report Authenticated By: Bernadene Bell. Maricela Curet, M.D.          Subjective: Patient is feeling better this morning. She denies any vomiting, diarrhea, abdominal pain, fevers, chills, chest pain, shortness of breath. She does have some nausea. No dysuria or hematuria.  Objective: Filed Vitals:   10/16/12 0451 10/16/12 0615 10/16/12 2027 10/17/12 0609  BP:   155/76 154/80  Pulse:   66 61  Temp:  98.4 F (36.9 C) 98 F (36.7 C) 98.4 F (36.9 C)  TempSrc:   Oral Oral  Resp:   20 20    Height:      Weight: 60.691 kg (133 lb 12.8 oz)   62.234 kg (137 lb 3.2 oz)  SpO2:   96% 91%    Intake/Output Summary (Last 24 hours) at 10/17/12 1030 Last data filed at 10/17/12 2130  Gross per 24 hour  Intake   4675 ml  Output   1150 ml  Net   3525 ml   Weight change: 1.542 kg (3 lb 6.4 oz) Exam:   General:  Pt is alert, follows commands appropriately, not in acute distress  HEENT: No icterus, No thrush, No neck mass, [place]/AT  Cardiovascular: RRR, S1/S2, no rubs, no gallops  Respiratory: Bibasilar crackles. No wheezes or rhonchi. Good air movement.  Abdomen: Soft/+BS, non tender, non distended, no guarding  Extremities: No edema, No lymphangitis, No petechiae, No rashes, no synovitis  Data Reviewed: Basic Metabolic Panel:  Lab 10/15/12 8657 10/14/12 2229  NA 134* 137  K 3.9 3.6  CL 101 101  CO2 23 24  GLUCOSE 109* 104*  BUN 30* 32*  CREATININE 1.40* 1.31*  CALCIUM 7.8* 8.9  MG -- --  PHOS -- --   Liver Function Tests:  Lab 10/15/12 0500 10/14/12 2229  AST 111* 162*  ALT 90* 119*  ALKPHOS 93 129*  BILITOT 0.3 0.5  PROT 5.4* 6.3  ALBUMIN 2.8* 3.4*   No results found  for this basename: LIPASE:5,AMYLASE:5 in the last 168 hours No results found for this basename: AMMONIA:5 in the last 168 hours CBC:  Lab 10/15/12 0500 10/14/12 2229  WBC 11.4* 7.8  NEUTROABS -- 7.3  HGB 8.6* 10.0*  HCT 25.1* 28.4*  MCV 92.6 91.9  PLT 110* 123*   Cardiac Enzymes:  Lab 10/15/12 1857 10/15/12 1856 10/15/12 1604 10/15/12 1207 10/15/12 0500 10/15/12 0251  CKTOTAL -- 147 -- -- -- --  CKMB -- 2.5 -- -- -- --  CKMBINDEX -- -- -- -- -- --  TROPONINI <0.30 -- 0.37* 0.47* 0.52* 0.64*   BNP: No components found with this basename: POCBNP:5 CBG:  Lab 10/17/12 0551 10/16/12 0756 10/15/12 0620  GLUCAP 91 100* 116*    Recent Results (from the past 240 hour(s))  URINE CULTURE     Status: Normal (Preliminary result)   Collection Time   10/15/12 12:55 AM      Component  Value Range Status Comment   Specimen Description URINE, CLEAN CATCH   Final    Special Requests CX ADDED AT 0118   Final    Culture  Setup Time 10/15/2012 02:28   Final    Colony Count >=100,000 COLONIES/ML   Final    Culture GRAM NEGATIVE RODS   Final    Report Status PENDING   Incomplete   CULTURE, BLOOD (ROUTINE X 2)     Status: Normal (Preliminary result)   Collection Time   10/15/12  3:59 PM      Component Value Range Status Comment   Specimen Description BLOOD LEFT FOREARM   Final    Special Requests BOTTLES DRAWN AEROBIC AND ANAEROBIC 10CC   Final    Culture  Setup Time 10/15/2012 23:05   Final    Culture     Final    Value:        BLOOD CULTURE RECEIVED NO GROWTH TO DATE CULTURE WILL BE HELD FOR 5 DAYS BEFORE ISSUING A FINAL NEGATIVE REPORT   Report Status PENDING   Incomplete   CULTURE, BLOOD (ROUTINE X 2)     Status: Normal (Preliminary result)   Collection Time   10/15/12  4:05 PM      Component Value Range Status Comment   Specimen Description BLOOD LEFT FOREARM   Final    Special Requests BOTTLES DRAWN AEROBIC AND ANAEROBIC 10CC   Final    Culture  Setup Time 10/15/2012 23:06   Final    Culture     Final    Value:        BLOOD CULTURE RECEIVED NO GROWTH TO DATE CULTURE WILL BE HELD FOR 5 DAYS BEFORE ISSUING A FINAL NEGATIVE REPORT   Report Status PENDING   Incomplete   URINE CULTURE     Status: Normal   Collection Time   10/15/12 11:30 PM      Component Value Range Status Comment   Specimen Description URINE, CLEAN CATCH   Final    Special Requests NONE   Final    Culture  Setup Time 10/15/2012 23:44   Final    Colony Count NO GROWTH   Final    Culture NO GROWTH   Final    Report Status 10/16/2012 FINAL   Final   SURGICAL PCR SCREEN     Status: Normal   Collection Time   10/16/12  8:13 PM      Component Value Range Status Comment   MRSA, PCR NEGATIVE  NEGATIVE Final    Staphylococcus  aureus NEGATIVE  NEGATIVE Final      Scheduled Meds:   . aspirin EC  325  mg Oral Daily  . atenolol  25 mg Oral QHS  . cefOXitin  2 g Intravenous On Call to OR  . ferric gluconate (FERRLECIT/NULECIT) IV  125 mg Intravenous Once  . ferrous sulfate  325 mg Oral BID WC  . levofloxacin (LEVAQUIN) IV  750 mg Intravenous Q48H  . metronidazole  500 mg Intravenous Q8H  . sodium chloride  3 mL Intravenous Q12H  . DISCONTD: cefTRIAXone (ROCEPHIN)  IV  1 g Intravenous Q24H  . DISCONTD: simvastatin  20 mg Oral q1800   Continuous Infusions:   . DISCONTD: sodium chloride 100 mL/hr at 10/17/12 0622     Kip Kautzman, DO  [facility] Pager [phone]  If 7PM-7AM, please contact night-coverage [email or web address] Password [email or web address] 10/17/2012, 10:30 AM   LOS: 3 days

## 2012-10-17 NOTE — Progress Notes (Signed)
Rings given to daughter Carollee Herter

## 2012-10-17 NOTE — Transfer of Care (Signed)
Immediate Anesthesia Transfer of Care Note  Patient: Charlene Garza  Procedure(s) Performed: Procedure(s) (LRB) with comments: LAPAROSCOPIC CHOLECYSTECTOMY WITH INTRAOPERATIVE CHOLANGIOGRAM (N/A)  Patient Location: PACU  Anesthesia Type:General  Level of Consciousness: awake, alert  and oriented  Airway & Oxygen Therapy: Patient Spontanous Breathing and Patient connected to nasal cannula oxygen  Post-op Assessment: Report given to PACU RN, Post -op Vital signs reviewed and stable and Patient moving all extremities  Post vital signs: Reviewed and stable  Complications: No apparent anesthesia complications

## 2012-10-17 NOTE — Anesthesia Postprocedure Evaluation (Signed)
  Anesthesia Post-op Note  Patient: Charlene Garza  Procedure(s) Performed: Procedure(s) (LRB) with comments: LAPAROSCOPIC CHOLECYSTECTOMY WITH INTRAOPERATIVE CHOLANGIOGRAM (N/A)  Patient Location: PACU  Anesthesia Type:General  Level of Consciousness: awake, alert  and oriented  Airway and Oxygen Therapy: Patient Spontanous Breathing  Post-op Pain: none  Post-op Assessment: Post-op Vital signs reviewed  Post-op Vital Signs: Reviewed  Complications: No apparent anesthesia complications

## 2012-10-17 NOTE — Anesthesia Preprocedure Evaluation (Signed)
Anesthesia Evaluation  Patient identified by MRN, date of birth, ID band Patient awake    Reviewed: Allergy & Precautions, H&P , NPO status , Patient's Chart, lab work & pertinent test results  History of Anesthesia Complications Negative for: history of anesthetic complications  Airway Mallampati: II TM Distance: >3 FB Neck ROM: Full    Dental  (+) Teeth Intact, Caps and Dental Advisory Given   Pulmonary neg pulmonary ROS,    Pulmonary exam normal       Cardiovascular hypertension, Pt. on medications     Neuro/Psych    GI/Hepatic negative GI ROS, Neg liver ROS,   Endo/Other  negative endocrine ROS  Renal/GU Renal InsufficiencyRenal disease     Musculoskeletal   Abdominal   Peds  Hematology   Anesthesia Other Findings   Reproductive/Obstetrics                           Anesthesia Physical Anesthesia Plan  ASA: III  Anesthesia Plan: General   Post-op Pain Management:    Induction: Intravenous  Airway Management Planned: Oral ETT  Additional Equipment:   Intra-op Plan:   Post-operative Plan: Extubation in OR  Informed Consent: I have reviewed the patients History and Physical, chart, labs and discussed the procedure including the risks, benefits and alternatives for the proposed anesthesia with the patient or authorized representative who has indicated his/her understanding and acceptance.   Dental advisory given  Plan Discussed with: CRNA, Anesthesiologist and Surgeon  Anesthesia Plan Comments:         Anesthesia Quick Evaluation

## 2012-10-17 NOTE — Op Note (Signed)
Pre Operative Diagnosis: Acute cholecystitis  Post Operative Diagnosis: Same  Surgeon: Dr. Axel Filler   Procedure: Lap chole with IOC  Assistant: Justine Null  Anesthesia: Gen. Endotracheal anesthesia   EBL: 5cc  Complications:  Counts: reported as correct x 2   Findings: The patient had large stones within the gallbladder.  Minimal inflammation  Indications for procedure: The patient presented to the ED for RUQ pain and was evaluated and found to have acute cholecystitis.    Details of the procedure:  The patient was taken to the operating and placed in the supine position with bilateral SCDs in place. A time out was called and all facts were verified. A pneumoperitoneum was obtained via A Veress needle technique to a pressure of 14mm of mercury. A 5mm trochar was then placed in the right upper quadrant under visualization, and there were no injuries to any abdominal organs. A 11 mm port was then placed in the umbilical region after infiltrating with local anesthesia under direct visualization. A second and third epigastric port and right lower quadrant port placement under direct visualization, respectively. The gallbladder was identified and retracted, the peritoneum was then sharply dissected from the gallbladder and this dissection was carried down to Calot's triangle. The gallbladder was identified and stripped away circumferentially and seen going into the gallbladder 360. A Cook catheter was used to perform an intraoperative cholangiogram. The biliary radicals as well as the cystic duct and common bile duct were seen free of filling defects.  2 clips were placed proximally one distally and the cystic duct transected. The cystic artery was identified and 2 clips placed proximally and one distally and transected.  We then proceeded to remove the gallbladder off the hepatic fossa with Bovie cautery. An Endo Catch bag was then placed in the abdomen and gallbladder placed in the  bag. The hepatic fossa was then reexamined and hemostasis was achieved with Bovie cautery and was excellent at the end of the case. The subhepatic fossa and perihepatic fossa was then irrigated until the effluent was clear. The 11 mm trocar fascia was reapproximated with the Endo Close #1 Vicryl.  The pneumoperitoneum was evacuated and all trochars removed under direct visulalization.  The skin was then closed with 4-0 Monocryl and the skin dressed with Steri-Strips, gauze, and tape.  The patient was awaken from general anesthesia and taken to the recovery room in stable condition.

## 2012-10-18 ENCOUNTER — Encounter (HOSPITAL_COMMUNITY): Payer: Self-pay | Admitting: General Surgery

## 2012-10-18 DIAGNOSIS — R404 Transient alteration of awareness: Secondary | ICD-10-CM

## 2012-10-18 DIAGNOSIS — R410 Disorientation, unspecified: Secondary | ICD-10-CM

## 2012-10-18 LAB — COMPREHENSIVE METABOLIC PANEL
ALT: 95 U/L — ABNORMAL HIGH (ref 0–35)
Alkaline Phosphatase: 155 U/L — ABNORMAL HIGH (ref 39–117)
CO2: 18 mEq/L — ABNORMAL LOW (ref 19–32)
Calcium: 9.2 mg/dL (ref 8.4–10.5)
Chloride: 105 mEq/L (ref 96–112)
GFR calc Af Amer: 41 mL/min — ABNORMAL LOW (ref >90)
GFR calc non Af Amer: 35 mL/min — ABNORMAL LOW (ref >90)
Glucose, Bld: 99 mg/dL (ref 70–99)
Sodium: 136 mEq/L (ref 135–145)
Total Bilirubin: 0.2 mg/dL — ABNORMAL LOW (ref 0.3–1.2)

## 2012-10-18 LAB — URINALYSIS, MICROSCOPIC ONLY
Ketones, ur: NEGATIVE mg/dL
Leukocytes, UA: NEGATIVE
Nitrite: NEGATIVE
Protein, ur: NEGATIVE mg/dL
Urobilinogen, UA: 0.2 mg/dL (ref 0.0–1.0)

## 2012-10-18 LAB — CBC
Hemoglobin: 9.9 g/dL — ABNORMAL LOW (ref 12.0–15.0)
MCHC: 35.5 g/dL (ref 30.0–36.0)
Platelets: 176 10*3/uL (ref 150–400)
RDW: 13.4 % (ref 11.5–15.5)

## 2012-10-18 MED ORDER — DEXTROSE 5 % IV SOLN
1.0000 g | INTRAVENOUS | Status: DC
  Administered 2012-10-18: 1 g via INTRAVENOUS
  Filled 2012-10-18 (×2): qty 10

## 2012-10-18 NOTE — Progress Notes (Signed)
[facility] PROGRESS NOTE  Charlene Garza:[id number] DOB: [birth year] DOA: 10/14/2012 PCP: Marga Melnick, MD  Assessment/Plan: Delirium -Suspect may be related to Ambien -Patient refused lower and this morning -Other considerations including metabolic derangement versus hypertensive encephalopathy -Discussed with daughter and updated. Daughter states that she will talk to the patient to get a workup done today -Retry to draw blood work with UA and urine culture Abdominal pain/transaminase anemia/cholelithiasis  -suspected cholecystitis -POD#1cholecystectomy  -CT abdomen shows cholelithiasis with prominent intrahepatic biliary ducts  -Abdominal ultrasound showed gallbladder wall thickening 4.2 cm with common bile duct 8.1 mm  -Patient had 3 days of nausea/vomiting prior to admission with right shoulder pain and right upper quadrant pain  -Patient refused her blood work this morning, states that she will reconsider now Fever/leukocytosis  -Improving on antibiotics  -Follow culture data  -Continue Levaquin and Flagyl  -Discontinued ceftriaxone  - influenza PCR negative  Urinary tract infection  -urine culture--E.coli -restart ceftriaxone.  Received cefoxitin for surgery yesterday. Elevated troponin  -Likely demand ischemia  -EKG without any ST-T wave changes suggestive of ischemia  -Has returned to normal  CKD stage III  -Creatinine is at baseline  Hyperlipidemia  -Discontinue Zocor given elevated liver enzymes  Thrombocytopenia  -Likely due to sepsis from her cholecystitis  -Continue to monitor     Family Communication:   Discussed with daughter Disposition Plan:   Home when medically stable   Procedures:  Laparoscopic cholecystectomy     Procedures/Studies: Dg Chest 2 View  10/14/2012  *RADIOLOGY REPORT*  Clinical Data: Fever.  Upper chest discomfort.  CHEST - 2 VIEW  Comparison: None.  Findings: Minimal linear atelectasis or scar is seen in the  left lung base.  The lungs  are otherwise clear.  There is cardiomegaly. No pneumothorax or pleural fluid.  Remote appearing T8, T9 and T12 compression fracture deformities are noted.  IMPRESSION: No acute finding.  Cardiomegaly.   Original Report Authenticated By: Bernadene Bell. D'ALESSIO, M.D.    Dg Cholangiogram Operative  10/17/2012  *RADIOLOGY REPORT*  Clinical Data:   Cholelithiasis  INTRAOPERATIVE CHOLANGIOGRAM  Technique:  Cholangiographic images from the C-arm fluoroscopic device were submitted for interpretation post-operatively.  Please see the procedural report for the amount of contrast and the fluoroscopy time utilized.  Comparison:  None  Findings:  No persistent filling defects in the common duct. Intrahepatic ducts are incompletely visualized, appearing decompressed centrally. Contrast passes into the duodenum.  IMPRESSION  Negative for retained common duct stone.   Original Report Authenticated By: D. Andria Rhein, MD    US Abdomen Complete  10/16/2012  *RADIOLOGY REPORT*  Clinical Data:  Abnormal CT.  Right lower quadrant pain.  COMPLETE ABDOMINAL ULTRASOUND  Comparison:  10/15/2012 CT.  Findings:  Gallbladder:  Multiple gallstones measuring up to 1.4 cm.  Stone within the gallbladder neck does not change position with patient position.  Gallbladder wall thickness 4.2 mm.  The patient was not tender over this region during scanning per ultrasound technologist.  Common bile duct:  8.1 mm.  Distal aspect not visualized secondary to bowel gas.  There may be sludge within the common bile duct.  Liver:  No focal liver lesion.  IVC:  Appears normal.  Pancreas:  No focal abnormality seen.  Spleen:  5 cm without focal mass.  Right Kidney:  11.6 cm.  No hydronephrosis.  Lower pole 3.6 cm minimally complex cyst.  Left Kidney:  9.7 cm.  No hydronephrosis.  Sub centimeter lesion too small to  characterize as a simple cyst.  Abdominal aorta:  Calcified with ectasia.  Portions not well delineated secondary to  bowel gas.  No obvious abdominal aortic aneurysm.  Trace ascites adjacent to the liver.  IMPRESSION: Multiple gallstones measuring up to 1.4 cm.  Stone within the gallbladder neck does not change position with patient position. Gallbladder wall thickened measuring 4.2 mm.  The patient was not tender over this region during scanning per ultrasound technologist.  Common bile duct measures up to 8.1 mm.  There may be sludge within the common bile duct.  Trace ascites adjacent to the liver.  This has been made a PRA call report utilizing dashboard call feature.   Original Report Authenticated By: Fuller Canada, M.D.    Ct Abdomen Pelvis W Contrast  10/15/2012  *RADIOLOGY REPORT*  Clinical Data: Right lower quadrant pain.  CT ABDOMEN AND PELVIS WITH CONTRAST  Technique:  Multidetector CT imaging of the abdomen and pelvis was performed following the standard protocol during bolus administration of intravenous contrast.  Contrast: 80mL OMNIPAQUE IOHEXOL 300 MG/ML  SOLN  Comparison: None.  Findings: There is some atelectatic change or scar in the lung bases.  Cardiomegaly is noted.  There is no pleural or pericardial effusion.  Several small stones identified within the gallbladder but there is no pericholecystic fluid or wall thickening.  Intrahepatic bile ducts are minimally prominent.  The liver is otherwise unremarkable.  The common bile duct and pancreatic duct appear normal.  The spleen, adrenal glands and left kidney are unremarkable.  A right renal cyst measures 3.4 cm in diameter.  Uterus, adnexa and urinary bladder are unremarkable.  A very small amount of free pelvic fluid is identified.  The stomach and small and large bowel and appendix are unremarkable.  Mild superior endplate compression fractures of T11 and T8 appear remote.  The patient has multilevel degenerative disease in the visualized spine.  No lytic or sclerotic lesion is identified.  IMPRESSION:  1.  Trace amount of free pelvic fluid of  uncertain etiology. Question gastroenteritis. The appendix appears normal. 2.  Multiple small gallstones without evidence of cholecystitis. 3.  Minimal prominence of the intrahepatic biliary ducts may be related to the patient's age.  Correlation with liver function test could be used for further evaluation. 4.  Cardiomegaly.   Original Report Authenticated By: Bernadene Bell. Maricela Curet, M.D.          Subjective: Patient is a little confused this morning. She is less agitated currently. She denies any headache, visual changes, chest pain, short of breath, nausea, vomiting, diarrhea, fevers, chills.  Objective: Filed Vitals:   10/17/12 1844 10/17/12 2204 10/18/12 0144 10/18/12 0516  BP: 165/98 177/73 185/90 175/82  Pulse: 60 57 54 56  Temp: 97.6 F (36.4 C) 98.7 F (37.1 C) 97.9 F (36.6 C) 97.9 F (36.6 C)  TempSrc: Oral Oral Oral Oral  Resp: 18 18 18 18   Height:      Weight:    62.324 kg (137 lb 6.4 oz)  SpO2: 99% 100% 100% 100%    Intake/Output Summary (Last 24 hours) at 10/18/12 1222 Last data filed at 10/18/12 2841  Gross per 24 hour  Intake   1340 ml  Output    425 ml  Net    915 ml   Weight change: 0.091 kg (3.2 oz) Exam:   General:  Pt is alert, follows commands appropriately, not in acute distress  HEENT: No icterus, No thrush, No neck mass, [place]/AT  Cardiovascular:  RRR, S1/S2, no rubs, no gallops  Respiratory: CTA bilaterally, no wheezing, no crackles, no rhonchi  Abdomen: Soft/+BS,  non distended, no guarding; mildly tender right lower quadrant and right upper quadrant without any peritoneal signs  Extremities: No edema, No lymphangitis, No petechiae, No rashes, no synovitis  Data Reviewed: Basic Metabolic Panel:  Lab 10/15/12 1610 10/14/12 2229  NA 134* 137  K 3.9 3.6  CL 101 101  CO2 23 24  GLUCOSE 109* 104*  BUN 30* 32*  CREATININE 1.40* 1.31*  CALCIUM 7.8* 8.9  MG -- --  PHOS -- --   Liver Function Tests:  Lab 10/15/12 0500 10/14/12 2229  AST  111* 162*  ALT 90* 119*  ALKPHOS 93 129*  BILITOT 0.3 0.5  PROT 5.4* 6.3  ALBUMIN 2.8* 3.4*   No results found for this basename: LIPASE:5,AMYLASE:5 in the last 168 hours No results found for this basename: AMMONIA:5 in the last 168 hours CBC:  Lab 10/15/12 0500 10/14/12 2229  WBC 11.4* 7.8  NEUTROABS -- 7.3  HGB 8.6* 10.0*  HCT 25.1* 28.4*  MCV 92.6 91.9  PLT 110* 123*   Cardiac Enzymes:  Lab 10/15/12 1857 10/15/12 1856 10/15/12 1604 10/15/12 1207 10/15/12 0500 10/15/12 0251  CKTOTAL -- 147 -- -- -- --  CKMB -- 2.5 -- -- -- --  CKMBINDEX -- -- -- -- -- --  TROPONINI <0.30 -- 0.37* 0.47* 0.52* 0.64*   BNP: No components found with this basename: POCBNP:5 CBG:  Lab 10/18/12 0635 10/17/12 0551 10/16/12 0756 10/15/12 0620  GLUCAP 106* 91 100* 116*    Recent Results (from the past 240 hour(s))  URINE CULTURE     Status: Normal   Collection Time   10/15/12 12:55 AM      Component Value Range Status Comment   Specimen Description URINE, CLEAN CATCH   Final    Special Requests CX ADDED AT 0118   Final    Culture  Setup Time 10/15/2012 02:28   Final    Colony Count >=100,000 COLONIES/ML   Final    Culture     Final    Value: ESCHERICHIA COLI     Note: Two isolates with different morphologies were identified as the same organism.The most resistant organism was reported.   Report Status 10/17/2012 FINAL   Final    Organism ID, Bacteria ESCHERICHIA COLI   Final   CULTURE, BLOOD (ROUTINE X 2)     Status: Normal (Preliminary result)   Collection Time   10/15/12  3:59 PM      Component Value Range Status Comment   Specimen Description BLOOD LEFT FOREARM   Final    Special Requests BOTTLES DRAWN AEROBIC AND ANAEROBIC 10CC   Final    Culture  Setup Time 10/15/2012 23:05   Final    Culture     Final    Value:        BLOOD CULTURE RECEIVED NO GROWTH TO DATE CULTURE WILL BE HELD FOR 5 DAYS BEFORE ISSUING A FINAL NEGATIVE REPORT   Report Status PENDING   Incomplete   CULTURE,  BLOOD (ROUTINE X 2)     Status: Normal (Preliminary result)   Collection Time   10/15/12  4:05 PM      Component Value Range Status Comment   Specimen Description BLOOD LEFT FOREARM   Final    Special Requests BOTTLES DRAWN AEROBIC AND ANAEROBIC 10CC   Final    Culture  Setup Time 10/15/2012 23:06   Final  Culture     Final    Value:        BLOOD CULTURE RECEIVED NO GROWTH TO DATE CULTURE WILL BE HELD FOR 5 DAYS BEFORE ISSUING A FINAL NEGATIVE REPORT   Report Status PENDING   Incomplete   URINE CULTURE     Status: Normal   Collection Time   10/15/12 11:30 PM      Component Value Range Status Comment   Specimen Description URINE, CLEAN CATCH   Final    Special Requests NONE   Final    Culture  Setup Time 10/15/2012 23:44   Final    Colony Count NO GROWTH   Final    Culture NO GROWTH   Final    Report Status 10/16/2012 FINAL   Final   SURGICAL PCR SCREEN     Status: Normal   Collection Time   10/16/12  8:13 PM      Component Value Range Status Comment   MRSA, PCR NEGATIVE  NEGATIVE Final    Staphylococcus aureus NEGATIVE  NEGATIVE Final      Scheduled Meds:   . atenolol  25 mg Oral QHS  . cefOXitin  2 g Intravenous On Call to OR  . ferrous sulfate  325 mg Oral BID WC  . sodium chloride  3 mL Intravenous Q12H  . DISCONTD: aspirin EC  325 mg Oral Daily  . DISCONTD: levofloxacin (LEVAQUIN) IV  750 mg Intravenous Q48H  . DISCONTD: metronidazole  500 mg Intravenous Q8H   Continuous Infusions:    Demika Langenderfer, DO  [facility] Pager [phone]  If 7PM-7AM, please contact night-coverage [email or web address] Password [email or web address] 10/18/2012, 12:22 PM   LOS: 4 days

## 2012-10-18 NOTE — Clinical Documentation Improvement (Signed)
SEPSIS DOCUMENTATION QUERY  THIS DOCUMENT IS NOT A PERMANENT PART OF THE MEDICAL RECORD   Please update your documentation within the medical record to reflect your response to this query.                                                                                     10/18/12  Dr. Arbutus Leas and/or Associates,  In a better effort to capture your patient's severity of illness, reflect appropriate length of stay and utilization of resources, a review of the patient medical record has revealed the following information:  "Thrombocytopenia  -Likely due to sepsis from her cholecystitis  -Continue to monitor" Foye Haggart, DO  10/17/2012, 10:30 AM Progress Note   BP 80's and 90's systolic in ED  WBC 7.8 on 10/14/12;   11.4 on 10/15/12  2000 mls of fluid given in ED  Not tachycardiac in ED, but on Atenolol 25mg  daily prior to admission  Chief complaint - Fever, Chills and Rigors  Temp 101 in ED     Based on your clinical judgment, please document in the progress notes and discharge summary if sepsis was:   - Present on Admission   - Not Present on Admission, and developed during the hospital admission   - Unable to Clinically Determine   In responding to this query please exercise your independent judgment.    The fact that a query is asked, does not imply that any particular answer is desired or expected.   Reviewed: additional documentation in the medical record  Thank You,  Jerral Ralph  RN BSN CCDS Certified Clinical Documentation Specialist: Cell   [phone]  Health Information Management [place]   TO RESPOND TO THE THIS QUERY, FOLLOW THE INSTRUCTIONS BELOW:  1. If needed, update documentation for the patient's encounter via the notes activity.  2. Access this query again and click edit on the In [employer].  3. After updating, or not, click F2 to complete all highlighted (required) fields concerning your review. Select "additional documentation  in the medical record" OR "no additional documentation provided".  4. Click Sign note button.  5. The deficiency will fall out of your In Basket *Please let us know if you are not able to complete this workflow by phone or e-mail (listed below).

## 2012-10-18 NOTE — Progress Notes (Signed)
Pt was bladder scanned at 3:45p for 300, pt did void however missed hat, will continue to monitor output, pt still needs urine sample, pt also noted to have non pitting edema to bilat lower extremities which was not noted this am, pt still is forgetful, pt received ambien last night with confusion early in am, daughter concerned with recent changes in pt, Dr Tat aware, will continue to monitor Worthy Flank, RN

## 2012-10-18 NOTE — Progress Notes (Signed)
Agree with PA-White's PN OK for Dc F/u x 2 weeks

## 2012-10-18 NOTE — Progress Notes (Signed)
Patient ID: Charlene Garza, female   DOB: 08/24/1930, 78 y.o.   MRN: [id number] 1 Day Post-Op  Subjective: Pt without major complaints, denies significant pain last night,nausea or vomiting.    Nurses report confusion this am but was given Ambien at 11pm last night, pt had never had this before, patient awake, alert and oriented now.  Objective: Vital signs in last 24 hours: Temp:  [97.6 F (36.4 C)-98.7 F (37.1 C)] 97.9 F (36.6 C) (11/01 0516) Pulse Rate:  [54-73] 56  (11/01 0516) Resp:  [12-20] 18  (11/01 0516) BP: (123-185)/(58-98) 175/82 mmHg (11/01 0516) SpO2:  [90 %-100 %] 100 % (11/01 0516) Weight:  [137 lb 6.4 oz (62.324 kg)] 137 lb 6.4 oz (62.324 kg) (11/01 0516) Last BM Date: 10/16/12  Intake/Output from previous day: 10/31 0701 - 11/01 0700 In: 1100 [I.V.:1100] Out: 100 [Urine:100] Intake/Output this shift:    PE: Abd: soft, mildly tender over incisions, +BS, dressings dry  Lab Results:  No results found for this basename: WBC:2,HGB:2,HCT:2,PLT:2 in the last 72 hours BMET No results found for this basename: NA:2,K:2,CL:2,CO2:2,GLUCOSE:2,BUN:2,CREATININE:2,CALCIUM:2 in the last 72 hours PT/INR No results found for this basename: LABPROT:2,INR:2 in the last 72 hours CMP     Component Value Date/Time   NA 134* 10/15/2012 0500   K 3.9 10/15/2012 0500   CL 101 10/15/2012 0500   CO2 23 10/15/2012 0500   GLUCOSE 109* 10/15/2012 0500   BUN 30* 10/15/2012 0500   CREATININE 1.40* 10/15/2012 0500   CALCIUM 7.8* 10/15/2012 0500   PROT 5.4* 10/15/2012 0500   ALBUMIN 2.8* 10/15/2012 0500   AST 111* 10/15/2012 0500   ALT 90* 10/15/2012 0500   ALKPHOS 93 10/15/2012 0500   BILITOT 0.3 10/15/2012 0500   GFRNONAA 34* 10/15/2012 0500   GFRAA 40* 10/15/2012 0500   Lipase  No results found for this basename: lipase       Studies/Results: Dg Cholangiogram Operative  10/17/2012  *RADIOLOGY REPORT*  Clinical Data:   Cholelithiasis  INTRAOPERATIVE CHOLANGIOGRAM   Technique:  Cholangiographic images from the C-arm fluoroscopic device were submitted for interpretation post-operatively.  Please see the procedural report for the amount of contrast and the fluoroscopy time utilized.  Comparison:  None  Findings:  No persistent filling defects in the common duct. Intrahepatic ducts are incompletely visualized, appearing decompressed centrally. Contrast passes into the duodenum.  IMPRESSION  Negative for retained common duct stone.   Original Report Authenticated By: D. Andria Rhein, MD    US Abdomen Complete  10/16/2012  *RADIOLOGY REPORT*  Clinical Data:  Abnormal CT.  Right lower quadrant pain.  COMPLETE ABDOMINAL ULTRASOUND  Comparison:  10/15/2012 CT.  Findings:  Gallbladder:  Multiple gallstones measuring up to 1.4 cm.  Stone within the gallbladder neck does not change position with patient position.  Gallbladder wall thickness 4.2 mm.  The patient was not tender over this region during scanning per ultrasound technologist.  Common bile duct:  8.1 mm.  Distal aspect not visualized secondary to bowel gas.  There may be sludge within the common bile duct.  Liver:  No focal liver lesion.  IVC:  Appears normal.  Pancreas:  No focal abnormality seen.  Spleen:  5 cm without focal mass.  Right Kidney:  11.6 cm.  No hydronephrosis.  Lower pole 3.6 cm minimally complex cyst.  Left Kidney:  9.7 cm.  No hydronephrosis.  Sub centimeter lesion too small to characterize as a simple cyst.  Abdominal aorta:  Calcified with ectasia.  Portions not well delineated secondary to bowel gas.  No obvious abdominal aortic aneurysm.  Trace ascites adjacent to the liver.  IMPRESSION: Multiple gallstones measuring up to 1.4 cm.  Stone within the gallbladder neck does not change position with patient position. Gallbladder wall thickened measuring 4.2 mm.  The patient was not tender over this region during scanning per ultrasound technologist.  Common bile duct measures up to 8.1 mm.  There may be  sludge within the common bile duct.  Trace ascites adjacent to the liver.  This has been made a PRA call report utilizing dashboard call feature.   Original Report Authenticated By: Fuller Canada, M.D.     Anti-infectives: Anti-infectives     Start     Dose/Rate Route Frequency Ordered Stop   10/17/12 0600   cefOXitin (MEFOXIN) 2 g in dextrose 5 % 50 mL IVPB        2 g 100 mL/hr over 30 Minutes Intravenous On call to O.R. 10/16/12 1925 10/17/12 1249   10/16/12 1400   levofloxacin (LEVAQUIN) IVPB 750 mg  Status:  Discontinued        750 mg 100 mL/hr over 90 Minutes Intravenous Every 48 hours 10/16/12 1156 10/17/12 1652   10/16/12 1400   metroNIDAZOLE (FLAGYL) IVPB 500 mg  Status:  Discontinued        500 mg 100 mL/hr over 60 Minutes Intravenous Every 8 hours 10/16/12 1156 10/17/12 1652   10/15/12 0500   cefTRIAXone (ROCEPHIN) 1 g in dextrose 5 % 50 mL IVPB  Status:  Discontinued        1 g 100 mL/hr over 30 Minutes Intravenous Every 24 hours 10/15/12 0454 10/16/12 1156           Assessment/Plan 1. POD#1-lap chole: doing well overall this am from surgical standpoint, advance diet as tolerated, pain minimal.  Confusion early this AM seems to have been a reaction to Ambien.  Can d/c home today from surgical standpoint if tolerating diet and pain remains controlled.  --advance diet as tolerated  --D/c Ambien  --may remove dressings tomorrow  --may shower tomorrow  --d/c instructions for [place] and follow up are in her discharge orders.  --will see her tomorrow if still here.   LOS: 4 days    Makinzee Durley 10/18/2012

## 2012-10-19 DIAGNOSIS — K8000 Calculus of gallbladder with acute cholecystitis without obstruction: Secondary | ICD-10-CM | POA: Diagnosis present

## 2012-10-19 MED ORDER — ONDANSETRON HCL 4 MG PO TABS: 4.0000 mg | ORAL_TABLET | Freq: Three times a day (TID) | ORAL | Status: DC | PRN

## 2012-10-19 MED ORDER — FERROUS SULFATE 325 (65 FE) MG PO TABS: 325.0000 mg | ORAL_TABLET | Freq: Two times a day (BID) | ORAL | Status: DC

## 2012-10-19 MED ORDER — OXYCODONE-ACETAMINOPHEN 5-325 MG PO TABS: 1.0000 | ORAL_TABLET | Freq: Four times a day (QID) | ORAL | Status: DC | PRN

## 2012-10-19 MED ORDER — CEPHALEXIN 500 MG PO CAPS: 500.0000 mg | ORAL_CAPSULE | Freq: Four times a day (QID) | ORAL | Status: DC

## 2012-10-19 NOTE — Progress Notes (Signed)
D/C patient from hospital when patient meets criteria:  Tolerating oral intake well Ambulating in walkways Adequate pain control without IV medications Urinating  Having flatus OK from IM standpoint

## 2012-10-19 NOTE — Progress Notes (Signed)
Patient ID: Charlene Garza, female   DOB: 1930/01/11, 78 y.o.   MRN: [id number] 2 Days Post-Op  Subjective: Pt without major complaints, denies significant pain last night, some nausea with the pain medicine and iron pills  Objective: Vital signs in last 24 hours: Temp:  [97.7 F (36.5 C)-98.9 F (37.2 C)] 98.3 F (36.8 C) (11/02 0547) Pulse Rate:  [60-65] 61  (11/02 0547) Resp:  [18-20] 18  (11/02 0547) BP: (144-178)/(65-78) 178/78 mmHg (11/02 0547) SpO2:  [92 %-95 %] 92 % (11/02 0547) Weight:  [142 lb (64.411 kg)] 142 lb (64.411 kg) (11/02 0547) Last BM Date: 10/16/12  Intake/Output from previous day: 11/01 0701 - 11/02 0700 In: 330 [P.O.:330] Out: 1425 [Urine:1425] Intake/Output this shift:    PE: Abd: soft, mildly tender over incisions, +BS  Lab Results:   [facility] 10/18/12 1129  WBC 7.4  HGB 9.9*  HCT 27.9*  PLT 176   BMET  Basename 10/18/12 1129  NA 136  K 4.3  CL 105  CO2 18*  GLUCOSE 99  BUN 27*  CREATININE 1.37*  CALCIUM 9.2   PT/INR No results found for this basename: LABPROT:2,INR:2 in the last 72 hours CMP     Component Value Date/Time   NA 136 10/18/2012 1129   K 4.3 10/18/2012 1129   CL 105 10/18/2012 1129   CO2 18* 10/18/2012 1129   GLUCOSE 99 10/18/2012 1129   BUN 27* 10/18/2012 1129   CREATININE 1.37* 10/18/2012 1129   CALCIUM 9.2 10/18/2012 1129   PROT 6.1 10/18/2012 1129   ALBUMIN 3.0* 10/18/2012 1129   AST 100* 10/18/2012 1129   ALT 95* 10/18/2012 1129   ALKPHOS 155* 10/18/2012 1129   BILITOT 0.2* 10/18/2012 1129   GFRNONAA 35* 10/18/2012 1129   GFRAA 41* 10/18/2012 1129   Lipase  No results found for this basename: lipase       Studies/Results: Dg Cholangiogram Operative  10/17/2012  *RADIOLOGY REPORT*  Clinical Data:   Cholelithiasis  INTRAOPERATIVE CHOLANGIOGRAM  Technique:  Cholangiographic images from the C-arm fluoroscopic device were submitted for interpretation post-operatively.  Please see the procedural report for the  amount of contrast and the fluoroscopy time utilized.  Comparison:  None  Findings:  No persistent filling defects in the common duct. Intrahepatic ducts are incompletely visualized, appearing decompressed centrally. Contrast passes into the duodenum.  IMPRESSION  Negative for retained common duct stone.   Original Report Authenticated By: D. Andria Rhein, MD     Anti-infectives: Anti-infectives     Start     Dose/Rate Route Frequency Ordered Stop   10/19/12 0000   cephALEXin (KEFLEX) 500 MG capsule        500 mg Oral 4 times daily 10/19/12 0946     10/18/12 1400   cefTRIAXone (ROCEPHIN) 1 g in dextrose 5 % 50 mL IVPB        1 g 100 mL/hr over 30 Minutes Intravenous Every 24 hours 10/18/12 1226     10/17/12 0600   cefOXitin (MEFOXIN) 2 g in dextrose 5 % 50 mL IVPB        2 g 100 mL/hr over 30 Minutes Intravenous On call to O.R. 10/16/12 1925 10/17/12 1249   10/16/12 1400   levofloxacin (LEVAQUIN) IVPB 750 mg  Status:  Discontinued        750 mg 100 mL/hr over 90 Minutes Intravenous Every 48 hours 10/16/12 1156 10/17/12 1652   10/16/12 1400   metroNIDAZOLE (FLAGYL) IVPB 500 mg  Status:  Discontinued        500 mg 100 mL/hr over 60 Minutes Intravenous Every 8 hours 10/16/12 1156 10/17/12 1652   10/15/12 0500   cefTRIAXone (ROCEPHIN) 1 g in dextrose 5 % 50 mL IVPB  Status:  Discontinued        1 g 100 mL/hr over 30 Minutes Intravenous Every 24 hours 10/15/12 0454 10/16/12 1156           Assessment/Plan 1. POD#2-lap chole: doing well overall this am from surgical standpoint, ok to discharge from surgical standpoint, advised patient she can use ibuprofen or tylenol for pain if narcotics cause nausea.  --d/c instructions for [place] and follow up are in her discharge orders.  --f/u in 2-3 weeks .   LOS: 5 days    Briggette Najarian 10/19/2012

## 2012-10-19 NOTE — Discharge Summary (Addendum)
Physician Discharge Summary  Charlene Garza ZOX:[id number] DOB: [birth year] DOA: 10/14/2012  PCP: Marga Melnick, MD  Admit date: 10/14/2012 Discharge date: 10/19/2012  Recommendations for Outpatient Follow-up:  1. Pt will need to follow up with PCP in 1 week post discharge 2. Follow up with [facility] in 1 week  Discharge Diagnoses:  Delirium  -Suspect may be related to Ambien  -resolved after stopping Ambien Abdominal pain/transaminase anemia/cholelithiasis  -suspected cholecystitis  -POD#2 cholecystectomy  -CT abdomen showed cholelithiasis with prominent intrahepatic biliary ducts  -Abdominal ultrasound showed gallbladder wall thickening 4.2 cm with common bile duct 8.1 mm  -Patient had 3 days of nausea/vomiting prior to admission with right shoulder pain and right upper quadrant pain  -Patient refused her blood work this morning, states that she will reconsider now  Fever/leukocytosis  Sepsis-present on admission -Improved on antibiotics  - influenza PCR negative  -Blood cultures negative Urinary tract infection  -urine culture--E.coli  -restart ceftriaxone. Received cefoxitin for surgery yesterday.  -Home with cephalexin 500 mg twice a day x6 days Elevated troponin  -Likely demand ischemia  -EKG without any ST-T wave changes suggestive of ischemia  -Has returned to normal  CKD stage III  -Creatinine is at baseline  Hyperlipidemia  -Discontinue Zocor given elevated liver enzymes  Thrombocytopenia  -Likely due to sepsis from her cholecystitis  -Improved with cholecystectomy and antibiotics Hypertension -Resume Altace and atenolol   Discharge Condition: Stable  Disposition:  Follow-up Information    Follow up with [facility] On 11/05/2012. (Arrive at 1:45pm for 2pm appointment.)    Contact information:   [address] [place] [address] [phone]         Diet: Cardiac Wt Readings from  Last 3 Encounters:  10/19/12 64.411 kg (142 lb)  10/19/12 64.411 kg (142 lb)  10/10/12 56.972 kg (125 lb 9.6 oz)    History of present illness:  78 y.o. female who presents with complaint of fever, chills, rigors onset 2 days duration, becoming increasingly severe until she had to go to the ED today, primary complaint being the rigors. Nothing makes it better or worse. Temperature in ED was 101.1. She was found to have a UA consistent with UTI, elevated transaminases, and an elevated troponin initially of 0.33 then of 0.63. The patient also related three-day history of right shoulder pain and abdominal pain with vomiting.   Hospital Course:  CT of the abdomen was obtained and showed cholelithiasis with prominent intrahepatic biliary ducts. Followup ultrasound of the gallbladder revealed gallbladder wall thickening. The patient had elevated liver enzymes at the time of admission. Given the patient's clinical syndrome, surgery was consulted for her cholecystitis. The patient had pyuria and was empirically started on ceftriaxone at the time of admission. This was changed over to Levaquin and Flagyl due to suspicions of cholecystitis. The patient underwent a laparoscopic cholecystectomy on 10/17/2012. Intraoperative cholangiogram did not show any choledocholithiasis. Today is postoperative day #2. She is clinically stable for discharge. She is tolerating her diet. Her pain is well-controlled. During her hospitalization, the patient did have an episode of confusion on the first postoperative day. This is thought to be due to her Ambien. This was discontinued with improvement of her mentation. The patient's ceftriaxone was restarted after her cholecystectomy. She will be discharged home with cephalexin for 6 more days. The patient was noted to have elevated troponins at the time of admission. This was thought to be due to demand ischemia. Her  troponins normalized. She had mild thrombocytopenia. This was thought  to be due to sepsis. Her platelets did improve at the time of discharge. The patient's liver enzymes continually trended down. This needs to be continued to be followed. The patient was also found to have iron deficiency with iron saturation of 4%. The patient was given a dose of ferritin gluconate IV . She was then started on ferrous sulfate 325 mg twice a day at the time of discharge. She was given instructions to followup with her primary care provider as well as [facility]. Her urine culture grew Escherichia coli.  Consultants: [facility]  Discharge Exam: Filed Vitals:   10/19/12 0547  BP: 178/78  Pulse: 61  Temp: 98.3 F (36.8 C)  Resp: 18   Filed Vitals:   10/18/12 0516 10/18/12 1424 10/18/12 2049 10/19/12 0547  BP: 175/82 152/67 144/65 178/78  Pulse: 56 60 65 61  Temp: 97.9 F (36.6 C) 97.7 F (36.5 C) 98.9 F (37.2 C) 98.3 F (36.8 C)  TempSrc: Oral Oral Oral Oral  Resp: 18 20 18 18   Height:      Weight: 62.324 kg (137 lb 6.4 oz)   64.411 kg (142 lb)  SpO2: 100% 92% 95% 92%   General: A&O x 3, NAD, pleasant, cooperative Cardiovascular: RRR, no rub, no gallop, no S3 Respiratory: Basilar crackles. No wheezes or rhonchi. Good air movement. Abdomen:soft, nontender, nondistended, positive bowel sounds Extremities: 1+ edema, No lymphangitis, no petechiae  Discharge Instructions      Discharge Orders    Future Appointments: Provider: Department: Dept Phone: Center:   11/05/2012 2:00 PM [facility] [phone] None     Future Orders Please Complete By Expires   Diet - low sodium heart healthy      Increase activity slowly      Discharge instructions      Comments:   Weigh yourself daily--if gain more 4 pounds in 2 days, call your physician Use your incentive spirometer 10 times daily Take cephalexin twice a day x 6 days       Medication List     As of 10/19/2012  1:12 PM    TAKE these medications           atenolol 25 MG tablet   Commonly known as: TENORMIN   Take 25 mg by mouth daily.      Calcium 100 MG capsule   Take 100 mg by mouth daily.      cephALEXin 500 MG capsule   Commonly known as: KEFLEX   Take 1 capsule (500 mg total) by mouth 4 (four) times daily.      ferrous sulfate 325 (65 FE) MG tablet   Take 1 tablet (325 mg total) by mouth 2 (two) times daily with a meal.      lovastatin 40 MG tablet   Commonly known as: MEVACOR   Take 40 mg by mouth at bedtime.      ondansetron 4 MG tablet   Commonly known as: ZOFRAN   Take 1 tablet (4 mg total) by mouth every 8 (eight) hours as needed for nausea.      oxyCODONE-acetaminophen 5-325 MG per tablet   Commonly known as: PERCOCET/ROXICET   Take 1 tablet by mouth every 6 (six) hours as needed for pain.      ramipril 2.5 MG capsule   Commonly known as: ALTACE   Take 2.5 mg by mouth daily.  Vitamin D 1000 UNITS capsule   Take 1,000 Units by mouth daily.      VITAMIN-B COMPLEX PO   Take 1 tablet by mouth daily.          The results of significant diagnostics from this hospitalization (including imaging, microbiology, ancillary and laboratory) are listed below for reference.    Significant Diagnostic Studies: Dg Chest 2 View  10/14/2012  *RADIOLOGY REPORT*  Clinical Data: Fever.  Upper chest discomfort.  CHEST - 2 VIEW  Comparison: None.  Findings: Minimal linear atelectasis or scar is seen in the left lung base.  The lungs  are otherwise clear.  There is cardiomegaly. No pneumothorax or pleural fluid.  Remote appearing T8, T9 and T12 compression fracture deformities are noted.  IMPRESSION: No acute finding.  Cardiomegaly.   Original Report Authenticated By: Bernadene Bell. D'ALESSIO, M.D.    Dg Cholangiogram Operative  10/17/2012  *RADIOLOGY REPORT*  Clinical Data:   Cholelithiasis  INTRAOPERATIVE CHOLANGIOGRAM  Technique:  Cholangiographic images from the C-arm fluoroscopic device were submitted for interpretation  post-operatively.  Please see the procedural report for the amount of contrast and the fluoroscopy time utilized.  Comparison:  None  Findings:  No persistent filling defects in the common duct. Intrahepatic ducts are incompletely visualized, appearing decompressed centrally. Contrast passes into the duodenum.  IMPRESSION  Negative for retained common duct stone.   Original Report Authenticated By: D. Andria Rhein, MD    US Abdomen Complete  10/16/2012  *RADIOLOGY REPORT*  Clinical Data:  Abnormal CT.  Right lower quadrant pain.  COMPLETE ABDOMINAL ULTRASOUND  Comparison:  10/15/2012 CT.  Findings:  Gallbladder:  Multiple gallstones measuring up to 1.4 cm.  Stone within the gallbladder neck does not change position with patient position.  Gallbladder wall thickness 4.2 mm.  The patient was not tender over this region during scanning per ultrasound technologist.  Common bile duct:  8.1 mm.  Distal aspect not visualized secondary to bowel gas.  There may be sludge within the common bile duct.  Liver:  No focal liver lesion.  IVC:  Appears normal.  Pancreas:  No focal abnormality seen.  Spleen:  5 cm without focal mass.  Right Kidney:  11.6 cm.  No hydronephrosis.  Lower pole 3.6 cm minimally complex cyst.  Left Kidney:  9.7 cm.  No hydronephrosis.  Sub centimeter lesion too small to characterize as a simple cyst.  Abdominal aorta:  Calcified with ectasia.  Portions not well delineated secondary to bowel gas.  No obvious abdominal aortic aneurysm.  Trace ascites adjacent to the liver.  IMPRESSION: Multiple gallstones measuring up to 1.4 cm.  Stone within the gallbladder neck does not change position with patient position. Gallbladder wall thickened measuring 4.2 mm.  The patient was not tender over this region during scanning per ultrasound technologist.  Common bile duct measures up to 8.1 mm.  There may be sludge within the common bile duct.  Trace ascites adjacent to the liver.  This has been made a PRA call  report utilizing dashboard call feature.   Original Report Authenticated By: Fuller Canada, M.D.    Ct Abdomen Pelvis W Contrast  10/15/2012  *RADIOLOGY REPORT*  Clinical Data: Right lower quadrant pain.  CT ABDOMEN AND PELVIS WITH CONTRAST  Technique:  Multidetector CT imaging of the abdomen and pelvis was performed following the standard protocol during bolus administration of intravenous contrast.  Contrast: 80mL OMNIPAQUE IOHEXOL 300 MG/ML  SOLN  Comparison: None.  Findings: There is  some atelectatic change or scar in the lung bases.  Cardiomegaly is noted.  There is no pleural or pericardial effusion.  Several small stones identified within the gallbladder but there is no pericholecystic fluid or wall thickening.  Intrahepatic bile ducts are minimally prominent.  The liver is otherwise unremarkable.  The common bile duct and pancreatic duct appear normal.  The spleen, adrenal glands and left kidney are unremarkable.  A right renal cyst measures 3.4 cm in diameter.  Uterus, adnexa and urinary bladder are unremarkable.  A very small amount of free pelvic fluid is identified.  The stomach and small and large bowel and appendix are unremarkable.  Mild superior endplate compression fractures of T11 and T8 appear remote.  The patient has multilevel degenerative disease in the visualized spine.  No lytic or sclerotic lesion is identified.  IMPRESSION:  1.  Trace amount of free pelvic fluid of uncertain etiology. Question gastroenteritis. The appendix appears normal. 2.  Multiple small gallstones without evidence of cholecystitis. 3.  Minimal prominence of the intrahepatic biliary ducts may be related to the patient's age.  Correlation with liver function test could be used for further evaluation. 4.  Cardiomegaly.   Original Report Authenticated By: Bernadene Bell. Maricela Curet, M.D.      Microbiology: Recent Results (from the past 240 hour(s))  URINE CULTURE     Status: Normal   Collection Time   10/15/12 12:55  AM      Component Value Range Status Comment   Specimen Description URINE, CLEAN CATCH   Final    Special Requests CX ADDED AT 0118   Final    Culture  Setup Time 10/15/2012 02:28   Final    Colony Count >=100,000 COLONIES/ML   Final    Culture     Final    Value: ESCHERICHIA COLI     Note: Two isolates with different morphologies were identified as the same organism.The most resistant organism was reported.   Report Status 10/17/2012 FINAL   Final    Organism ID, Bacteria ESCHERICHIA COLI   Final   CULTURE, BLOOD (ROUTINE X 2)     Status: Normal (Preliminary result)   Collection Time   10/15/12  3:59 PM      Component Value Range Status Comment   Specimen Description BLOOD LEFT FOREARM   Final    Special Requests BOTTLES DRAWN AEROBIC AND ANAEROBIC 10CC   Final    Culture  Setup Time 10/15/2012 23:05   Final    Culture     Final    Value:        BLOOD CULTURE RECEIVED NO GROWTH TO DATE CULTURE WILL BE HELD FOR 5 DAYS BEFORE ISSUING A FINAL NEGATIVE REPORT   Report Status PENDING   Incomplete   CULTURE, BLOOD (ROUTINE X 2)     Status: Normal (Preliminary result)   Collection Time   10/15/12  4:05 PM      Component Value Range Status Comment   Specimen Description BLOOD LEFT FOREARM   Final    Special Requests BOTTLES DRAWN AEROBIC AND ANAEROBIC 10CC   Final    Culture  Setup Time 10/15/2012 23:06   Final    Culture     Final    Value:        BLOOD CULTURE RECEIVED NO GROWTH TO DATE CULTURE WILL BE HELD FOR 5 DAYS BEFORE ISSUING A FINAL NEGATIVE REPORT   Report Status PENDING   Incomplete   URINE CULTURE  Status: Normal   Collection Time   10/15/12 11:30 PM      Component Value Range Status Comment   Specimen Description URINE, CLEAN CATCH   Final    Special Requests NONE   Final    Culture  Setup Time 10/15/2012 23:44   Final    Colony Count NO GROWTH   Final    Culture NO GROWTH   Final    Report Status 10/16/2012 FINAL   Final   SURGICAL PCR SCREEN     Status: Normal    Collection Time   10/16/12  8:13 PM      Component Value Range Status Comment   MRSA, PCR NEGATIVE  NEGATIVE Final    Staphylococcus aureus NEGATIVE  NEGATIVE Final      Labs: Basic Metabolic Panel:  Lab 10/18/12 0981 10/15/12 0500 10/14/12 2229  NA 136 134* 137  K 4.3 3.9 --  CL 105 101 101  CO2 18* 23 24  GLUCOSE 99 109* 104*  BUN 27* 30* 32*  CREATININE 1.37* 1.40* 1.31*  CALCIUM 9.2 7.8* 8.9  MG -- -- --  PHOS -- -- --   Liver Function Tests:  Lab 10/18/12 1129 10/15/12 0500 10/14/12 2229  AST 100* 111* 162*  ALT 95* 90* 119*  ALKPHOS 155* 93 129*  BILITOT 0.2* 0.3 0.5  PROT 6.1 5.4* 6.3  ALBUMIN 3.0* 2.8* 3.4*   No results found for this basename: LIPASE:5,AMYLASE:5 in the last 168 hours No results found for this basename: AMMONIA:5 in the last 168 hours CBC:  Lab 10/18/12 1129 10/15/12 0500 10/14/12 2229  WBC 7.4 11.4* 7.8  NEUTROABS -- -- 7.3  HGB 9.9* 8.6* 10.0*  HCT 27.9* 25.1* 28.4*  MCV 91.8 92.6 91.9  PLT 176 110* 123*   Cardiac Enzymes:  Lab 10/15/12 1857 10/15/12 1856 10/15/12 1604 10/15/12 1207 10/15/12 0500 10/15/12 0251  CKTOTAL -- 147 -- -- -- --  CKMB -- 2.5 -- -- -- --  CKMBINDEX -- -- -- -- -- --  TROPONINI <0.30 -- 0.37* 0.47* 0.52* 0.64*   BNP: No components found with this basename: POCBNP:5 CBG:  Lab 10/18/12 0635 10/17/12 0551 10/16/12 0756 10/15/12 0620  GLUCAP 106* 91 100* 116*    Time coordinating discharge:  Greater than 30 minutes  Signed:  Keslyn Teater, DO [facility] Pager: [phone] 10/19/2012, 1:12 PM

## 2012-10-21 LAB — CULTURE, BLOOD (ROUTINE X 2): Culture: NO GROWTH

## 2012-11-01 ENCOUNTER — Encounter: Payer: Self-pay | Admitting: Internal Medicine

## 2012-11-01 ENCOUNTER — Ambulatory Visit (INDEPENDENT_AMBULATORY_CARE_PROVIDER_SITE_OTHER): Payer: Medicare Other | Admitting: Internal Medicine

## 2012-11-01 VITALS — BP 122/80 | HR 63 | Temp 97.6°F | Wt 126.0 lb

## 2012-11-01 DIAGNOSIS — D649 Anemia, unspecified: Secondary | ICD-10-CM

## 2012-11-01 DIAGNOSIS — R7401 Elevation of levels of liver transaminase levels: Secondary | ICD-10-CM

## 2012-11-01 DIAGNOSIS — N289 Disorder of kidney and ureter, unspecified: Secondary | ICD-10-CM

## 2012-11-01 DIAGNOSIS — R7402 Elevation of levels of lactic acid dehydrogenase (LDH): Secondary | ICD-10-CM

## 2012-11-01 LAB — HEPATIC FUNCTION PANEL
Alkaline Phosphatase: 87 U/L (ref 39–117)
Bilirubin, Direct: 0 mg/dL (ref 0.0–0.3)
Total Bilirubin: 0.4 mg/dL (ref 0.3–1.2)

## 2012-11-01 LAB — CBC WITH DIFFERENTIAL/PLATELET
Eosinophils Absolute: 0.5 10*3/uL (ref 0.0–0.7)
Eosinophils Relative: 9.2 % — ABNORMAL HIGH (ref 0.0–5.0)
Lymphocytes Relative: 20.8 % (ref 12.0–46.0)
MCHC: 33.1 g/dL (ref 30.0–36.0)
MCV: 95.3 fl (ref 78.0–100.0)
Monocytes Absolute: 0.5 10*3/uL (ref 0.1–1.0)
Neutrophils Relative %: 58.6 % (ref 43.0–77.0)
Platelets: 351 10*3/uL (ref 150.0–400.0)
RBC: 3.22 Mil/uL — ABNORMAL LOW (ref 3.87–5.11)
WBC: 4.9 10*3/uL (ref 4.5–10.5)

## 2012-11-01 LAB — BASIC METABOLIC PANEL
BUN: 26 mg/dL — ABNORMAL HIGH (ref 6–23)
Chloride: 104 mEq/L (ref 96–112)
Creatinine, Ser: 1.3 mg/dL — ABNORMAL HIGH (ref 0.4–1.2)
Glucose, Bld: 108 mg/dL — ABNORMAL HIGH (ref 70–99)
Potassium: 4.3 mEq/L (ref 3.5–5.1)

## 2012-11-01 NOTE — Progress Notes (Signed)
  Subjective:    Patient ID: Charlene Garza, female    DOB: Apr 11, 1930, 78 y.o.   MRN: [id number]  HPI  She underwent laparoscopic cholecystectomy 10/17/12 by Dr. Derrell Lolling. Postoperatively she has had one pain pill. She denies fever, chills, sweats, nausea, or abdominal pain.  Lab results were reviewed. She had mild renal insufficiency with a BUN 27, creatinine 1.37, and GFR of 35. She exhibited significant anemia with a hemoglobin of 27.9. White blood count was normal. AST was 100 and ALT 95.    Review of Systems She denies hematuria, melena, or rectal bleeding. She's had no bleeding dyscrasias     Objective:   Physical Exam General appearance is one of good health and nourishment w/o distress.Appears younger than stated age   Eyes: No conjunctival inflammation or scleral icterus is present.  Oral exam: Dental hygiene is good; lips and gums are healthy appearing.There is no oropharyngeal erythema or exudate noted.   Heart:  Normal rate and regular rhythm. S1 and S2 normal without gallop, murmur, click, rub or other extra sounds     Lungs:Chest clear to auscultation; no wheezes, rhonchi,rales ,or rubs present.No increased work of breathing.   Abdomen: bowel sounds normal, soft and non-tender without masses, organomegaly or hernias noted.  No guarding or rebound   Skin:Warm & dry.  Intact without suspicious lesions or rashes ; no jaundice or tenting. Minor bruising at the laparoscopic cholecystectomy op site superiorly  Lymphatic: No lymphadenopathy is noted about the head, neck, axilla            Assessment & Plan:  #1 status post laparoscopic cholecystectomy; clinically doing exceptionally well  #2 renal insufficiency, mild  #3 anemia  #4 hepatic enzyme elevations  Plan: See orders and recommendations

## 2012-11-01 NOTE — Patient Instructions (Addendum)
Review and correct the record as indicated. Please share record with all medical staff seen.   If you activate My Chart; the results can be released to you as soon as they populate from the lab. If you choose not to use this program; the labs have to be reviewed, copied & mailed   causing a delay in getting the results to you.  

## 2012-11-05 ENCOUNTER — Encounter (INDEPENDENT_AMBULATORY_CARE_PROVIDER_SITE_OTHER): Payer: Self-pay | Admitting: General Surgery

## 2012-11-05 ENCOUNTER — Ambulatory Visit (INDEPENDENT_AMBULATORY_CARE_PROVIDER_SITE_OTHER): Payer: Medicare Other | Admitting: General Surgery

## 2012-11-05 VITALS — BP 136/80 | HR 60 | Temp 97.0°F | Ht 62.0 in | Wt 124.0 lb

## 2012-11-05 DIAGNOSIS — Z9889 Other specified postprocedural states: Secondary | ICD-10-CM

## 2012-11-05 DIAGNOSIS — K8010 Calculus of gallbladder with chronic cholecystitis without obstruction: Secondary | ICD-10-CM

## 2012-11-05 NOTE — Patient Instructions (Signed)
Follow up as needed

## 2012-11-05 NOTE — Progress Notes (Signed)
Charlene Garza 1930/01/10 [id number] 11/05/2012   Charlene Garza is a 78 y.o. female who had a laparoscopic cholecystectomy with intraoperative cholangiogram by Dr. Derrell Lolling.  The pathology report confirmed chronic cholecystitis and cholelithiasis.  The patient reports that they are feeling well with normal bowel movements and good appetite.  The pre-operative symptoms of abdominal pain, nausea, and vomiting have resolved.    Physical examination - Incisions appear well-healed with no sign of infection or bleeding.   Abdomen - soft, non-tender  Impression:  s/p laparoscopic cholecystectomy  Plan:  She may resume a regular diet and full activity.  She may follow-up on a PRN basis.

## 2012-12-24 ENCOUNTER — Telehealth: Payer: Self-pay | Admitting: *Deleted

## 2012-12-24 NOTE — Telephone Encounter (Signed)
I reviewed the chart; no significant murmur noted on exam. Unless an orthopedist has performed some procedure with hardware implantation; no SBE prophylaxis needed.

## 2012-12-24 NOTE — Telephone Encounter (Signed)
Pt states that she hear some where that a antibiotic in needed prior to dental procedure. Pt notes that she is scheduled to have a teeth cleaning on tomorrow.Please advise

## 2012-12-25 NOTE — Telephone Encounter (Signed)
Discuss with patient  

## 2013-03-29 ENCOUNTER — Other Ambulatory Visit: Payer: Self-pay | Admitting: Internal Medicine

## 2013-06-05 ENCOUNTER — Other Ambulatory Visit: Payer: Self-pay | Admitting: Internal Medicine

## 2013-06-05 DIAGNOSIS — E785 Hyperlipidemia, unspecified: Secondary | ICD-10-CM

## 2013-06-07 NOTE — Telephone Encounter (Signed)
#  30;                            Please  schedule fasting Labs : CK,Lipids, hepatic panel,  TSH & F/U OV. Codes: 272.4,995.20.

## 2013-06-07 NOTE — Telephone Encounter (Signed)
Last OV 11-01-12, Last refilled 09-17-12 #90, no history of lipid and no pending appt.Please advise

## 2013-06-09 NOTE — Telephone Encounter (Signed)
Left message to call office to advise Pt that labs due need to schedule appt.

## 2013-06-19 ENCOUNTER — Other Ambulatory Visit: Payer: Medicare Other

## 2013-07-09 ENCOUNTER — Other Ambulatory Visit: Payer: Medicare Other

## 2013-08-07 ENCOUNTER — Other Ambulatory Visit: Payer: Medicare Other

## 2013-08-10 ENCOUNTER — Other Ambulatory Visit: Payer: Self-pay | Admitting: Internal Medicine

## 2013-08-13 NOTE — Telephone Encounter (Signed)
Med filled pt has a lab appt on 9/19.

## 2013-08-29 ENCOUNTER — Other Ambulatory Visit: Payer: Self-pay | Admitting: Internal Medicine

## 2013-09-01 NOTE — Telephone Encounter (Signed)
Med filled until appt.

## 2013-09-05 ENCOUNTER — Other Ambulatory Visit (INDEPENDENT_AMBULATORY_CARE_PROVIDER_SITE_OTHER): Payer: Medicare Other

## 2013-09-05 DIAGNOSIS — E785 Hyperlipidemia, unspecified: Secondary | ICD-10-CM

## 2013-09-05 LAB — HEPATIC FUNCTION PANEL
ALT: 24 U/L (ref 0–35)
AST: 32 U/L (ref 0–37)
Albumin: 4.4 g/dL (ref 3.5–5.2)
Total Protein: 7 g/dL (ref 6.0–8.3)

## 2013-09-05 LAB — LDL CHOLESTEROL, DIRECT: Direct LDL: 122.9 mg/dL

## 2013-09-05 LAB — LIPID PANEL
Cholesterol: 206 mg/dL — ABNORMAL HIGH (ref 0–200)
Triglycerides: 50 mg/dL (ref 0.0–149.0)

## 2013-09-06 ENCOUNTER — Encounter: Payer: Self-pay | Admitting: Internal Medicine

## 2013-09-09 ENCOUNTER — Encounter: Payer: Self-pay | Admitting: *Deleted

## 2013-09-10 ENCOUNTER — Ambulatory Visit: Payer: Medicare Other

## 2013-09-11 ENCOUNTER — Ambulatory Visit (INDEPENDENT_AMBULATORY_CARE_PROVIDER_SITE_OTHER): Payer: Medicare Other

## 2013-09-11 DIAGNOSIS — Z23 Encounter for immunization: Secondary | ICD-10-CM

## 2013-09-24 ENCOUNTER — Ambulatory Visit: Payer: Medicare Other | Admitting: Internal Medicine

## 2013-10-02 ENCOUNTER — Ambulatory Visit (INDEPENDENT_AMBULATORY_CARE_PROVIDER_SITE_OTHER): Payer: Medicare Other | Admitting: Internal Medicine

## 2013-10-02 ENCOUNTER — Encounter: Payer: Self-pay | Admitting: Internal Medicine

## 2013-10-02 VITALS — BP 165/80 | HR 58 | Temp 98.0°F | Wt 132.2 lb

## 2013-10-02 DIAGNOSIS — D649 Anemia, unspecified: Secondary | ICD-10-CM

## 2013-10-02 DIAGNOSIS — E559 Vitamin D deficiency, unspecified: Secondary | ICD-10-CM

## 2013-10-02 DIAGNOSIS — I10 Essential (primary) hypertension: Secondary | ICD-10-CM

## 2013-10-02 DIAGNOSIS — E785 Hyperlipidemia, unspecified: Secondary | ICD-10-CM

## 2013-10-02 LAB — BASIC METABOLIC PANEL
CO2: 27 mEq/L (ref 19–32)
Calcium: 9.8 mg/dL (ref 8.4–10.5)
Chloride: 103 mEq/L (ref 96–112)
Glucose, Bld: 81 mg/dL (ref 70–99)
Sodium: 139 mEq/L (ref 135–145)

## 2013-10-02 LAB — CBC WITH DIFFERENTIAL/PLATELET
Basophils Absolute: 0 10*3/uL (ref 0.0–0.1)
Basophils Relative: 0.8 % (ref 0.0–3.0)
Eosinophils Absolute: 0.1 10*3/uL (ref 0.0–0.7)
Lymphs Abs: 0.9 10*3/uL (ref 0.7–4.0)
MCV: 94.7 fl (ref 78.0–100.0)
Monocytes Relative: 10.6 % (ref 3.0–12.0)
Neutro Abs: 3 10*3/uL (ref 1.4–7.7)
Neutrophils Relative %: 66.5 % (ref 43.0–77.0)
Platelets: 140 10*3/uL — ABNORMAL LOW (ref 150.0–400.0)
WBC: 4.4 10*3/uL — ABNORMAL LOW (ref 4.5–10.5)

## 2013-10-02 MED ORDER — ATORVASTATIN CALCIUM 10 MG PO TABS: 10.0000 mg | ORAL_TABLET | Freq: Every day | ORAL | Status: DC

## 2013-10-02 NOTE — Patient Instructions (Addendum)
Minimal Blood Pressure Goal= AVERAGE < 140/90;  Ideal is an AVERAGE < 135/85. This AVERAGE should be calculated from @ least 5-7 BP readings taken @ different times of day on different days of week. You should not respond to isolated BP readings , but rather the AVERAGE for that week .Please bring your  blood pressure cuff to office visits to verify that it is reliable.It  can also be checked against the blood pressure device at the pharmacy. Finger or wrist cuffs are not dependable; an arm cuff is. Please  schedule fasting Labs in 9 weeks after statin change: CK,Lipids, TSH. PLEASE BRING THESE INSTRUCTIONS TO FOLLOW UP  LAB APPOINTMENT.This will guarantee correct labs are drawn, eliminating need for repeat blood sampling ( needle sticks ! ). Diagnoses /Codes: 272.4,995.20  If you activate the  My Chart system; lab & Xray results will be released directly  to you as soon as I review & address these through the computer. If you choose not to sign up for My Chart within 36 hours of labs being drawn; results will be reviewed & interpretation added before being copied & mailed, causing a delay in getting the results to you.If you do not receive that report within 7-10 days ,please call. Additionally you can use this system to gain direct  access to your records  if  out of town or @ an office of a  physician who is not in  the My Chart network.  This improves continuity of care & places you in control of your medical record.

## 2013-10-02 NOTE — Progress Notes (Signed)
  Subjective:    Patient ID: Charlene Garza, female    DOB: [birth year], 79 y.o.   MRN: [id number]  HPI   She is here for refill of her statin and antihypertensive medication. She is essentially asymptomatic. She stopped ramipril. Her past medical history and chart were updated in problem list summarized.  Her most recent lipids revealed HDL of 76.6, triglycerides 50, LDL 129.9 and CK of 276.  Perioperatively she did have increase in her hepatic enzymes. These have completely resolved.   Review of Systems She is on a heart healthy diet; she has no regular exercise program .Specifically she denies chest pain, palpitations, dyspnea, or claudication. Family history is negative for premature coronary disease. Advanced cholesterol testing reveals her LDL goal is less than 160, ideally < 130. There is medication compliance with the statin. Significant abdominal symptoms, memory deficit, or myalgias denied.  Blood pressure at home has ranged from 1:15-172/85 or less. She has no adverse effects from the medications. She has been compliant with the medicines. She denies any lightheadedness or syncope. Postoperatively she was prescribed iron in November 2013. She's been taking this "as needed"      Objective:   Physical Exam Gen.: Healthy and well-nourished in appearance. Alert, appropriate and cooperative throughout exam. Appears much younger than stated age   Eyes: No corneal or conjunctival inflammation noted. Pupils equal but small.   Nose: External nasal exam reveals no deformity or inflammation. Nasal mucosa are pink and moist. No lesions or exudates noted.   Mouth: Oral mucosa and oropharynx reveal no lesions or exudates. Teeth in good repair. Neck: No deformities, masses, or tenderness noted.  Thyroid normal. Lungs: Normal respiratory effort; chest expands symmetrically. Lungs are clear to auscultation without rales, wheezes, or increased work of breathing. Heart: Normal rate and rhythm.  Normal S1 and S2. No gallop, click, or rub. S4 w/o murmur. Abdomen: Bowel sounds normal; abdomen soft and nontender. No masses, organomegaly or hernias noted.                           Musculoskeletal/extremities:  No clubbing, cyanosis, edema, or significant extremity  deformity noted. Range of motion normal .Tone & strength  Normal. Crepitus knees Joints normal. Nail health good. Able to lie down & sit up w/o help. Negative SLR bilaterally Vascular: Carotid, radial artery, dorsalis pedis and  posterior tibial pulses are full and equal. No bruits present. Neurologic: Alert and oriented x3. Deep tendon reflexes symmetrical and normal.         Skin: Intact without suspicious lesions or rashes. Lymph: No cervical, axillary lymphadenopathy present. Psych: Mood and affect are normal. Normally interactive                                                                                         Assessment & Plan:  See Current Assessment & Plan in Problem List under specific Diagnosis

## 2013-10-02 NOTE — Assessment & Plan Note (Addendum)
Lipids, LFTs,CK after 10 weeks atorvastatin 10 mg 1/2  qd

## 2013-10-02 NOTE — Assessment & Plan Note (Signed)
CBC & dif and IBC 

## 2013-10-02 NOTE — Assessment & Plan Note (Signed)
Goals discussed BMET

## 2013-10-02 NOTE — Assessment & Plan Note (Signed)
Vit D level.

## 2013-10-06 ENCOUNTER — Encounter: Payer: Self-pay | Admitting: *Deleted

## 2013-10-06 NOTE — Progress Notes (Signed)
Letter mailed to patient.

## 2013-10-09 LAB — VITAMIN D 1,25 DIHYDROXY
Vitamin D 1, 25 (OH)2 Total: 44 pg/mL (ref 18–72)
Vitamin D2 1, 25 (OH)2: <8 pg/mL
Vitamin D3 1, 25 (OH)2: 44 pg/mL

## 2013-10-10 ENCOUNTER — Encounter: Payer: Self-pay | Admitting: General Practice

## 2013-12-17 ENCOUNTER — Other Ambulatory Visit: Payer: Self-pay | Admitting: Internal Medicine

## 2013-12-17 NOTE — Telephone Encounter (Signed)
Ramipril refilled per protocol. JG//CMA 

## 2013-12-19 NOTE — Telephone Encounter (Signed)
Atenolol refilled per protocol. JG//CMA 

## 2013-12-24 ENCOUNTER — Other Ambulatory Visit: Payer: Self-pay | Admitting: Internal Medicine

## 2013-12-24 NOTE — Telephone Encounter (Signed)
Atorvastatin refilled per protocol. JG//CMA 

## 2014-01-21 ENCOUNTER — Ambulatory Visit: Payer: Medicare Other

## 2014-01-21 DIAGNOSIS — I10 Essential (primary) hypertension: Secondary | ICD-10-CM

## 2014-01-21 NOTE — Progress Notes (Signed)
Patient ID: Charlene Austriaatricia J Garza, female   DOB: Apr 19, 1930, 80 y.o.   MRN: [id number] Patient presents to the office today for recheck on her BP.  States that her home BP at awakening was 158/81. The check in office was 191/79. This is consistent with readings that she has been getting at home.   Advised that Dr Alwyn RenHopper will review the readings and we will call her ASAP.

## 2014-06-18 ENCOUNTER — Other Ambulatory Visit: Payer: Self-pay | Admitting: Internal Medicine

## 2014-08-12 ENCOUNTER — Other Ambulatory Visit: Payer: Self-pay

## 2014-08-12 MED ORDER — ATORVASTATIN CALCIUM 10 MG PO TABS
ORAL_TABLET | ORAL | Status: DC
Start: 2014-08-12 — End: 2017-06-07

## 2014-09-03 ENCOUNTER — Ambulatory Visit (INDEPENDENT_AMBULATORY_CARE_PROVIDER_SITE_OTHER): Payer: Medicare Other

## 2014-09-03 DIAGNOSIS — Z23 Encounter for immunization: Secondary | ICD-10-CM

## 2014-10-19 ENCOUNTER — Encounter: Payer: Self-pay | Admitting: Internal Medicine

## 2014-10-29 ENCOUNTER — Ambulatory Visit: Payer: Medicare Other | Admitting: Internal Medicine

## 2014-11-26 ENCOUNTER — Ambulatory Visit: Payer: Medicare Other | Admitting: Internal Medicine

## 2015-01-14 ENCOUNTER — Ambulatory Visit: Payer: Medicare Other | Admitting: Internal Medicine

## 2015-02-27 ENCOUNTER — Other Ambulatory Visit: Payer: Self-pay | Admitting: Internal Medicine

## 2015-03-03 ENCOUNTER — Other Ambulatory Visit: Payer: Self-pay | Admitting: Internal Medicine

## 2015-06-23 ENCOUNTER — Other Ambulatory Visit: Payer: Self-pay | Admitting: Internal Medicine

## 2015-06-24 ENCOUNTER — Other Ambulatory Visit: Payer: Self-pay

## 2015-06-24 MED ORDER — ATENOLOL 25 MG PO TABS: 25.0000 mg | ORAL_TABLET | Freq: Two times a day (BID) | ORAL | Status: DC

## 2015-06-24 NOTE — Telephone Encounter (Signed)
#  15 Last seen 09/2013. She would need OV for additional refills

## 2015-06-24 NOTE — Telephone Encounter (Signed)
Last office visit oct/2014---please advise, thanks

## 2015-07-02 ENCOUNTER — Other Ambulatory Visit (INDEPENDENT_AMBULATORY_CARE_PROVIDER_SITE_OTHER): Payer: Medicare Other

## 2015-07-02 ENCOUNTER — Ambulatory Visit (INDEPENDENT_AMBULATORY_CARE_PROVIDER_SITE_OTHER): Payer: Medicare Other | Admitting: Internal Medicine

## 2015-07-02 ENCOUNTER — Encounter: Payer: Self-pay | Admitting: Internal Medicine

## 2015-07-02 ENCOUNTER — Other Ambulatory Visit: Payer: Self-pay | Admitting: Internal Medicine

## 2015-07-02 VITALS — BP 140/110 | HR 54 | Temp 97.7°F | Ht 62.0 in | Wt 131.0 lb

## 2015-07-02 DIAGNOSIS — E559 Vitamin D deficiency, unspecified: Secondary | ICD-10-CM | POA: Diagnosis not present

## 2015-07-02 DIAGNOSIS — N289 Disorder of kidney and ureter, unspecified: Secondary | ICD-10-CM | POA: Diagnosis not present

## 2015-07-02 DIAGNOSIS — I10 Essential (primary) hypertension: Secondary | ICD-10-CM | POA: Diagnosis not present

## 2015-07-02 DIAGNOSIS — D6489 Other specified anemias: Secondary | ICD-10-CM | POA: Diagnosis not present

## 2015-07-02 DIAGNOSIS — E785 Hyperlipidemia, unspecified: Secondary | ICD-10-CM | POA: Diagnosis not present

## 2015-07-02 LAB — CBC WITH DIFFERENTIAL/PLATELET
BASOS PCT: 0.5 % (ref 0.0–3.0)
Basophils Absolute: 0 10*3/uL (ref 0.0–0.1)
EOS PCT: 1.8 % (ref 0.0–5.0)
Eosinophils Absolute: 0.1 10*3/uL (ref 0.0–0.7)
HCT: 36.5 % (ref 36.0–46.0)
Hemoglobin: 12.4 g/dL (ref 12.0–15.0)
LYMPHS ABS: 1.1 10*3/uL (ref 0.7–4.0)
Lymphocytes Relative: 23.9 % (ref 12.0–46.0)
MCHC: 33.9 g/dL (ref 30.0–36.0)
MCV: 94.9 fl (ref 78.0–100.0)
MONO ABS: 0.5 10*3/uL (ref 0.1–1.0)
MONOS PCT: 10.4 % (ref 3.0–12.0)
NEUTROS ABS: 3 10*3/uL (ref 1.4–7.7)
Neutrophils Relative %: 63.4 % (ref 43.0–77.0)
PLATELETS: 178 10*3/uL (ref 150.0–400.0)
RBC: 3.84 Mil/uL — ABNORMAL LOW (ref 3.87–5.11)
RDW: 13.5 % (ref 11.5–15.5)
WBC: 4.8 10*3/uL (ref 4.0–10.5)

## 2015-07-02 LAB — HEPATIC FUNCTION PANEL
ALT: 20 U/L (ref 0–35)
AST: 27 U/L (ref 0–37)
Albumin: 4.5 g/dL (ref 3.5–5.2)
Alkaline Phosphatase: 48 U/L (ref 39–117)
BILIRUBIN DIRECT: 0.1 mg/dL (ref 0.0–0.3)
TOTAL PROTEIN: 7.3 g/dL (ref 6.0–8.3)
Total Bilirubin: 0.6 mg/dL (ref 0.2–1.2)

## 2015-07-02 LAB — BASIC METABOLIC PANEL
BUN: 25 mg/dL — ABNORMAL HIGH (ref 6–23)
CHLORIDE: 102 meq/L (ref 96–112)
CO2: 25 mEq/L (ref 19–32)
CREATININE: 1.35 mg/dL — AB (ref 0.40–1.20)
Calcium: 9.8 mg/dL (ref 8.4–10.5)
GFR: 39.8 mL/min — ABNORMAL LOW (ref >60.00)
GLUCOSE: 86 mg/dL (ref 70–99)
POTASSIUM: 4.9 meq/L (ref 3.5–5.1)
Sodium: 135 mEq/L (ref 135–145)

## 2015-07-02 LAB — TSH: TSH: 0.73 u[IU]/mL (ref 0.35–4.50)

## 2015-07-02 LAB — VITAMIN D 25 HYDROXY (VIT D DEFICIENCY, FRACTURES): VITD: 29.49 ng/mL — ABNORMAL LOW (ref 30.00–100.00)

## 2015-07-02 NOTE — Assessment & Plan Note (Signed)
NMR

## 2015-07-02 NOTE — Patient Instructions (Signed)
  Your next office appointment will be determined based upon review of your pending labs  and  xrays  Those written interpretation of the lab results and instructions will be transmitted to you by mail for your records.  Critical results will be called.   Followup as needed for any active or acute issue. Please report any significant change in your symptoms. 

## 2015-07-02 NOTE — Assessment & Plan Note (Signed)
Blood pressure goals reviewed. BMET 

## 2015-07-02 NOTE — Assessment & Plan Note (Signed)
BMET 

## 2015-07-02 NOTE — Progress Notes (Signed)
   Subjective:    Patient ID: Charlene Garza, female    DOB: 04/19/1930, 81 y.o.   MRN: [id number]  HPI The patient is here to assess status of active health conditions.  PMH, FH, & Social History reviewed & updated.  She is not on a heart healthy diet. She does restrict salt.She does not exercise; but she climbs stairs 5 times a day.   She is not taking the ACE inhibitor or the atorvastatin. She is unsure why she stopped the ramipril. She was concerned that atorvastatin might be impacting memory.  She does have nocturia 1-2 times per night. Otherwise she has no active symptoms.  She's never had a colonoscopy;" I just don't want one".    Review of Systems  Chest pain, palpitations, tachycardia, exertional dyspnea, paroxysmal nocturnal dyspnea, claudication or edema are absent. No unexplained weight loss, abdominal pain, significant dyspepsia, dysphagia, melena, rectal bleeding, or persistently small caliber stools. Dysuria, pyuria, hematuria, frequency,  or polyuria are denied. Change in hair, skin, nails denied. No bowel changes of constipation or diarrhea. No intolerance to heat or cold.    Objective:   Physical Exam   Pertinent or positive findings include: Wax present in the left canal. Increased second heart sound. Minimal DIP osteoarthritic changes. Crepitus of the knees. Varicose veins and spiders over the lower extremities. Stasis hyperpigmentation over the lower extremities. Decreased pedal pulses except for the left dorsalis pedis pulse. Deep tendon reflexes 0-1/2+ at the knees.  General appearance :adequately nourished; in no distress.Appears younger than stated age  Eyes: No conjunctival inflammation or scleral icterus is present.  Oral exam:  Lips and gums are healthy appearing.There is no oropharyngeal erythema or exudate noted. Dental hygiene is good.  Heart:  Normal rate and regular rhythm. S1 normal without gallop, murmur, click, rub or other extra sounds     Lungs:Chest clear to auscultation; no wheezes, rhonchi,rales ,or rubs present.No increased work of breathing.   Abdomen: bowel sounds normal, soft and non-tender without masses, organomegaly or hernias noted.  No guarding or rebound.   Vascular : no bruits present.  Skin:Warm & dry.  Intact without suspicious lesions or rashes ; no tenting or jaundice   Lymphatic: No lymphadenopathy is noted about the head, neck, axilla   Neuro: Strength, tone & DTRs normal.       Assessment & Plan:  See Current Assessment & Plan in Problem List under specific Diagnosis

## 2015-07-02 NOTE — Progress Notes (Signed)
Pre visit review using our clinic review tool, if applicable. No additional management support is needed unless otherwise documented below in the visit note. 

## 2015-07-02 NOTE — Assessment & Plan Note (Signed)
Vit D level.

## 2015-07-05 ENCOUNTER — Other Ambulatory Visit: Payer: Self-pay | Admitting: Emergency Medicine

## 2015-07-05 ENCOUNTER — Telehealth: Payer: Self-pay | Admitting: Emergency Medicine

## 2015-07-05 ENCOUNTER — Telehealth: Payer: Self-pay | Admitting: Internal Medicine

## 2015-07-05 MED ORDER — ATENOLOL 25 MG PO TABS: 25.0000 mg | ORAL_TABLET | Freq: Two times a day (BID) | ORAL | Status: DC

## 2015-07-05 NOTE — Telephone Encounter (Signed)
Please advise on refill for atenolol.

## 2015-07-05 NOTE — Telephone Encounter (Signed)
#  90 ,R X 2

## 2015-07-05 NOTE — Telephone Encounter (Signed)
Patient was in last week and per her lab results Dr. Alwyn RenHopper was going to send prescription atenolol (TENORMIN) 25 MG tablet [[id number].  To pharmacy [facility] on [address]. Please advise patient

## 2015-07-09 LAB — NMR LIPOPROFILE WITH LIPIDS
CHOLESTEROL, TOTAL: 274 mg/dL — AB (ref 100–199)
HDL PARTICLE NUMBER: 36.9 umol/L (ref >=30.5)
HDL Size: 10 nm (ref >=9.2)
HDL-C: 87 mg/dL (ref >39)
LDL CALC: 175 mg/dL — AB (ref 0–99)
LDL PARTICLE NUMBER: 1262 nmol/L — AB (ref <1000)
LDL Size: 21.7 nm (ref >=20.8)
LP-IR Score: <25 (ref <=45)
Large HDL-P: 14.5 umol/L (ref >=4.8)
Large VLDL-P: <0.8 nmol/L (ref <=2.7)
Small LDL Particle Number: <90 nmol/L (ref <=527)
TRIGLYCERIDES: 60 mg/dL (ref 0–149)
VLDL SIZE: 30.8 nm (ref <=46.6)

## 2015-09-24 ENCOUNTER — Ambulatory Visit (INDEPENDENT_AMBULATORY_CARE_PROVIDER_SITE_OTHER): Payer: Medicare Other

## 2015-09-24 DIAGNOSIS — Z23 Encounter for immunization: Secondary | ICD-10-CM | POA: Diagnosis not present

## 2016-01-26 ENCOUNTER — Other Ambulatory Visit (INDEPENDENT_AMBULATORY_CARE_PROVIDER_SITE_OTHER): Payer: Medicare Other

## 2016-01-26 ENCOUNTER — Encounter: Payer: Self-pay | Admitting: Internal Medicine

## 2016-01-26 ENCOUNTER — Ambulatory Visit (INDEPENDENT_AMBULATORY_CARE_PROVIDER_SITE_OTHER): Payer: Medicare Other | Admitting: Internal Medicine

## 2016-01-26 ENCOUNTER — Ambulatory Visit: Payer: Medicare Other | Admitting: Internal Medicine

## 2016-01-26 VITALS — BP 210/100 | HR 75 | Temp 97.6°F | Resp 16 | Ht 63.0 in | Wt 130.1 lb

## 2016-01-26 DIAGNOSIS — Z23 Encounter for immunization: Secondary | ICD-10-CM

## 2016-01-26 DIAGNOSIS — E785 Hyperlipidemia, unspecified: Secondary | ICD-10-CM | POA: Diagnosis not present

## 2016-01-26 DIAGNOSIS — I10 Essential (primary) hypertension: Secondary | ICD-10-CM

## 2016-01-26 DIAGNOSIS — N289 Disorder of kidney and ureter, unspecified: Secondary | ICD-10-CM | POA: Diagnosis not present

## 2016-01-26 LAB — COMPREHENSIVE METABOLIC PANEL
ALT: 21 U/L (ref 0–35)
AST: 28 U/L (ref 0–37)
Albumin: 4.3 g/dL (ref 3.5–5.2)
Alkaline Phosphatase: 45 U/L (ref 39–117)
BILIRUBIN TOTAL: 0.5 mg/dL (ref 0.2–1.2)
BUN: 31 mg/dL — ABNORMAL HIGH (ref 6–23)
CALCIUM: 9.8 mg/dL (ref 8.4–10.5)
CO2: 28 mEq/L (ref 19–32)
CREATININE: 1.27 mg/dL — AB (ref 0.40–1.20)
Chloride: 103 mEq/L (ref 96–112)
GFR: 42.65 mL/min — ABNORMAL LOW (ref >60.00)
Glucose, Bld: 96 mg/dL (ref 70–99)
Potassium: 4.8 mEq/L (ref 3.5–5.1)
Sodium: 138 mEq/L (ref 135–145)
TOTAL PROTEIN: 7.2 g/dL (ref 6.0–8.3)

## 2016-01-26 MED ORDER — AMLODIPINE BESYLATE 10 MG PO TABS: 10.0000 mg | ORAL_TABLET | Freq: Every day | ORAL | Status: DC

## 2016-01-26 NOTE — Progress Notes (Signed)
Pre visit review using our clinic review tool, if applicable. No additional management support is needed unless otherwise documented below in the visit note. 

## 2016-01-26 NOTE — Patient Instructions (Signed)
I have sent in a medicine for blood pressure called amlodipine. It is a once a day medicine that generally does not have any side effects for people.   If the blood pressure is still >140 for the top or >90 for the bottom I would like you to start taking it.   We should check some blood work today to make sure the kidneys are not being affected by the blood pressure.   Work on increasing exercise as this does help to bring down the pressures some.   Exercising to Stay Healthy Exercising regularly is important. It has many health benefits, such as:  Improving your overall fitness, flexibility, and endurance.  Increasing your bone density.  Helping with weight control.  Decreasing your body fat.  Increasing your muscle strength.  Reducing stress and tension.  Improving your overall health. In order to become healthy and stay healthy, it is recommended that you do moderate-intensity and vigorous-intensity exercise. You can tell that you are exercising at a moderate intensity if you have a higher heart rate and faster breathing, but you are still able to hold a conversation. You can tell that you are exercising at a vigorous intensity if you are breathing much harder and faster and cannot hold a conversation while exercising. HOW OFTEN SHOULD I EXERCISE? Choose an activity that you enjoy and set realistic goals. Your health care provider can help you to make an activity plan that works for you. Exercise regularly as directed by your health care provider. This may include:   Doing resistance training twice each week, such as:  Push-ups.  Sit-ups.  Lifting weights.  Using resistance bands.  Doing a given intensity of exercise for a given amount of time. Choose from these options:  150 minutes of moderate-intensity exercise every week.  75 minutes of vigorous-intensity exercise every week.  A mix of moderate-intensity and vigorous-intensity exercise every week. Children,  pregnant women, people who are out of shape, people who are overweight, and older adults may need to consult a health care provider for individual recommendations. If you have any sort of medical condition, be sure to consult your health care provider before starting a new exercise program.  WHAT ARE SOME EXERCISE IDEAS? Some moderate-intensity exercise ideas include:   Walking at a rate of 1 mile in 15 minutes.  Biking.  Hiking.  Golfing.  Dancing. Some vigorous-intensity exercise ideas include:   Walking at a rate of at least 4.5 miles per hour.  Jogging or running at a rate of 5 miles per hour.  Biking at a rate of at least 10 miles per hour.  Lap swimming.  Roller-skating or in-line skating.  Cross-country skiing.  Vigorous competitive sports, such as football, basketball, and soccer.  Jumping rope.  Aerobic dancing. WHAT ARE SOME EVERYDAY ACTIVITIES THAT CAN HELP ME TO GET EXERCISE?  Yard work, such as:  Child psychotherapist.  Raking and bagging leaves.  Washing and waxing your car.  Pushing a stroller.  Shoveling snow.  Gardening.  Washing windows or floors. HOW CAN I BE MORE ACTIVE IN MY DAY-TO-DAY ACTIVITIES?  Use the stairs instead of the elevator.  Take a walk during your lunch break.  If you drive, park your car farther away from work or school.  If you take public transportation, get off one stop early and walk the rest of the way.  Make all of your phone calls while standing up and walking around.  Get up, stretch, and walk  around every 30 minutes throughout the day. WHAT GUIDELINES SHOULD I FOLLOW WHILE EXERCISING?  Do not exercise so much that you hurt yourself, feel dizzy, or get very short of breath.  Consult your health care provider before starting a new exercise program.  Wear comfortable clothes and shoes with good support.  Drink plenty of water while you exercise to prevent dehydration or heat stroke. Body water is lost  during exercise and must be replaced.  Work out until you breathe faster and your heart beats faster.   This information is not intended to replace advice given to you by your health care provider. Make sure you discuss any questions you have with your health care provider.   Document Released: 01/06/2011 Document Revised: 12/25/2014 Document Reviewed: 05/07/2014 Elsevier Interactive Patient Education [employer]

## 2016-01-27 ENCOUNTER — Telehealth: Payer: Self-pay | Admitting: Internal Medicine

## 2016-01-27 ENCOUNTER — Other Ambulatory Visit: Payer: Self-pay | Admitting: Geriatric Medicine

## 2016-01-27 MED ORDER — ATENOLOL 25 MG PO TABS: 25.0000 mg | ORAL_TABLET | Freq: Two times a day (BID) | ORAL | Status: DC

## 2016-01-27 NOTE — Telephone Encounter (Signed)
If patient does not answer please leave VM

## 2016-01-27 NOTE — Telephone Encounter (Signed)
States Dr. Okey Dupre was to sent atenolol to [facility] on [address].  States pharmacy has not received yet.  Patient states she needs as soon as possible because she is going out of town.  Please call patient back in regards.

## 2016-01-27 NOTE — Telephone Encounter (Signed)
Sent to pharmacy 

## 2016-01-28 NOTE — Assessment & Plan Note (Signed)
She has not started the statin and she should given LDL 175 last. She also has uncontrolled hypertension. She declines at this time.

## 2016-01-28 NOTE — Assessment & Plan Note (Signed)
Checking CMP today and adjust as needed. BP not at goal. No signs of diabetes in the past.

## 2016-01-28 NOTE — Assessment & Plan Note (Signed)
BP is not well controlled and she never started the ramipril advised. Will add amlodipine 10 mg daily and keep atenolol 25 mg BID. Checking labs today and adjust as needed. She is reluctant to start the ramipril as she has heard bad things about it. Talked to her about the fact that her kidneys can take damage from high blood pressure if not controlled.

## 2016-01-28 NOTE — Progress Notes (Signed)
   Subjective:    Patient ID: Charlene Garza, female    DOB: 1930-01-31, 82 y.o.   MRN: [id number]  HPI The patient is an 82 YO female coming in for follow up of her medical problems including her hypertension (she was asked to take ramipril and atenolol at last visit but has not done that, severely elevated today, denies headaches, chest pains, SOB, confusion), her cholesterol (was asked to start taking lipitor but has not done so, no hx CVD), and her renal insufficiency (likely from her uncontrolled blood pressure, not on ACE-I or ARB). She denies new complaints today.   PMH, [facility], social history reviewed and updated.   Review of Systems  Constitutional: Negative for fever, activity change, appetite change, fatigue and unexpected weight change.  HENT: Negative.   Eyes: Negative.   Respiratory: Negative for cough, chest tightness, shortness of breath and wheezing.   Cardiovascular: Negative for chest pain, palpitations and leg swelling.  Gastrointestinal: Negative for nausea, abdominal pain, diarrhea, constipation and abdominal distention.  Musculoskeletal: Negative.   Skin: Negative.   Neurological: Negative for dizziness, syncope, weakness, light-headedness and headaches.  Psychiatric/Behavioral: Negative.       Objective:   Physical Exam  Constitutional: She is oriented to person, place, and time. She appears well-developed and well-nourished.  HENT:  Head: Normocephalic and atraumatic.  Eyes: EOM are normal.  Neck: Normal range of motion.  Cardiovascular: Normal rate and regular rhythm.   Pulmonary/Chest: Effort normal and breath sounds normal. No respiratory distress. She has no wheezes. She has no rales.  Abdominal: Soft. Bowel sounds are normal. She exhibits no distension. There is no tenderness. There is no rebound.  Musculoskeletal: She exhibits no edema.  Neurological: She is alert and oriented to person, place, and time. Coordination normal.  Skin: Skin is warm and dry.    Psychiatric: She has a normal mood and affect.   Filed Vitals:   01/26/16 1002 01/26/16 1037  BP: 220/110 210/100  Pulse: 75   Temp: 97.6 F (36.4 C)   TempSrc: Oral   Resp: 16   Height:  (1.6 m)   Weight: 130 lb 1.9 oz (59.022 kg)   SpO2: 98%       Assessment & Plan:  Prevnar 13 given at visit.

## 2016-10-12 DIAGNOSIS — Z23 Encounter for immunization: Secondary | ICD-10-CM | POA: Diagnosis not present

## 2017-02-13 DIAGNOSIS — H524 Presbyopia: Secondary | ICD-10-CM | POA: Diagnosis not present

## 2017-02-13 DIAGNOSIS — H26491 Other secondary cataract, right eye: Secondary | ICD-10-CM | POA: Diagnosis not present

## 2017-04-17 ENCOUNTER — Telehealth: Payer: Self-pay | Admitting: Internal Medicine

## 2017-04-19 ENCOUNTER — Telehealth: Payer: Self-pay

## 2017-04-19 ENCOUNTER — Other Ambulatory Visit: Payer: Self-pay | Admitting: Internal Medicine

## 2017-04-19 MED ORDER — ATENOLOL 25 MG PO TABS: 25.0000 mg | ORAL_TABLET | Freq: Two times a day (BID) | ORAL | 0 refills | Status: DC

## 2017-04-19 NOTE — Telephone Encounter (Signed)
30 day supply sent in.

## 2017-04-19 NOTE — Telephone Encounter (Signed)
Sent to correct pharmacy

## 2017-04-19 NOTE — Telephone Encounter (Signed)
Patient is calling again about her refills on this medication. She is going out of town for 3weeks. I have set her up an appointment for May 29 @ 1pm. She is going out of town for 3 weeks. Can we please send in a 30 day supply. Thank you.

## 2017-05-15 ENCOUNTER — Ambulatory Visit: Payer: Medicare Other | Admitting: Internal Medicine

## 2017-05-15 ENCOUNTER — Other Ambulatory Visit: Payer: Self-pay | Admitting: Internal Medicine

## 2017-05-15 NOTE — Telephone Encounter (Signed)
Patient had to reschedule appt due to Charlene Garza being out.  Patient has rescheduled for 6/27 at 11am.  [facility]

## 2017-05-15 NOTE — Telephone Encounter (Signed)
done

## 2017-05-15 NOTE — Telephone Encounter (Signed)
Rec'd call pt states she had to reschedule her appt until 6/27, but she is out of her Atenolol. Requesting refill until appt. Per chart refillhas already been approved for 30 day [facility]...Raechel Chute/lmb

## 2017-06-07 ENCOUNTER — Ambulatory Visit (INDEPENDENT_AMBULATORY_CARE_PROVIDER_SITE_OTHER): Payer: Medicare Other | Admitting: Nurse Practitioner

## 2017-06-07 ENCOUNTER — Encounter: Payer: Self-pay | Admitting: Nurse Practitioner

## 2017-06-07 VITALS — BP 198/96 | HR 54 | Temp 98.3°F | Ht 63.0 in | Wt 127.0 lb

## 2017-06-07 DIAGNOSIS — L03115 Cellulitis of right lower limb: Secondary | ICD-10-CM | POA: Diagnosis not present

## 2017-06-07 DIAGNOSIS — R600 Localized edema: Secondary | ICD-10-CM | POA: Diagnosis not present

## 2017-06-07 DIAGNOSIS — I10 Essential (primary) hypertension: Secondary | ICD-10-CM

## 2017-06-07 MED ORDER — ATENOLOL 25 MG PO TABS: 25.0000 mg | ORAL_TABLET | Freq: Two times a day (BID) | ORAL | 5 refills | Status: DC

## 2017-06-07 MED ORDER — AMLODIPINE BESYLATE 10 MG PO TABS: 10.0000 mg | ORAL_TABLET | Freq: Every day | ORAL | 5 refills | Status: DC

## 2017-06-07 MED ORDER — DOXYCYCLINE HYCLATE 100 MG PO TABS: 100.0000 mg | ORAL_TABLET | Freq: Two times a day (BID) | ORAL | 0 refills | Status: DC

## 2017-06-07 MED ORDER — POTASSIUM CHLORIDE ER 10 MEQ PO TBCR: 10.0000 meq | EXTENDED_RELEASE_TABLET | Freq: Every day | ORAL | 0 refills | Status: DC

## 2017-06-07 MED ORDER — FUROSEMIDE 20 MG PO TABS: 10.0000 mg | ORAL_TABLET | Freq: Every day | ORAL | 0 refills | Status: DC

## 2017-06-07 NOTE — Patient Instructions (Signed)
Return to office in 1week for re eval of right leg.  Take furosemide and potassium every morning x 6days, then stop.  Take doxycycline with food.  Elevate legs as much as possible.  Bring BP machine to next office visit.

## 2017-06-07 NOTE — Progress Notes (Signed)
Subjective:  Patient ID: Charlene Garza, female    DOB: 18-Nov-1930  Age: 83 y.o. MRN: [id number]  CC: Foot Swelling (feet sweeling,right leg swelling--cut 2 wk ago, painful at time)   Wound Check  She was originally treated more than 14 days ago. Previous treatment included wound cleansing or irrigation. Her temperature was unmeasured prior to arrival. There has been clear discharge from the wound. The redness has worsened. The swelling has worsened. The pain has worsened. She has no difficulty moving the affected extremity or digit.  hit right leg against laundry machine. Last TDAP 2012.  HTN: Uncontrolled. She states this morning home BP was 136/87. Does not check BP daily. Current use of only atenolol BID. Use amlodipine prn. Has intermittent episodes of headsche and dizziness.  Outpatient Medications Prior to Visit  Medication Sig Dispense Refill  . amLODipine (NORVASC) 10 MG tablet Take 1 tablet (10 mg total) by mouth daily. 90 tablet 3  . atenolol (TENORMIN) 25 MG tablet take 1 tablet by mouth twice a day (NEED OFFICE VISIT FOR MORE REFILLS) 60 tablet 0  . atorvastatin (LIPITOR) 10 MG tablet TAKE ONE TABLET BY MOUTH ONCE DAILY. IN PLACE OF LOVASTATIN (Patient not taking: Reported on 07/02/2015) 30 tablet 5  . ramipril (ALTACE) 2.5 MG capsule TAKE ONE CAPSULE BY MOUTH EVERY DAY (Patient not taking: Reported on 07/02/2015) 90 capsule 0   No facility-administered medications prior to visit.     ROS See HPI  Objective:  BP (!) 198/96   Pulse (!) 54   Temp 98.3 F (36.8 C)   Ht 5\' 3"  (1.6 m)   Wt 127 lb (57.6 kg)   SpO2 96%   BMI 22.50 kg/m   BP Readings from Last 3 Encounters:  06/07/17 (!) 198/96  01/26/16 (!) 210/100  07/02/15 (!) 140/110    Wt Readings from Last 3 Encounters:  06/07/17 127 lb (57.6 kg)  01/26/16 130 lb 1.9 oz (59 kg)  07/02/15 131 lb (59.4 kg)    Physical Exam  Constitutional: She is oriented to person, place, and time.  Cardiovascular:  Normal rate and regular rhythm.   Pulmonary/Chest: Effort normal and breath sounds normal.  Musculoskeletal: She exhibits edema and tenderness.       Legs: Neurological: She is alert and oriented to person, place, and time.  Skin: Skin is warm and dry. There is erythema.  Vitals reviewed.   Lab Results  Component Value Date   WBC 4.8 07/02/2015   HGB 12.4 07/02/2015   HCT 36.5 07/02/2015   PLT 178.0 07/02/2015   GLUCOSE 96 01/26/2016   CHOL 274 (H) 07/02/2015   TRIG 60 07/02/2015   HDL 87 07/02/2015   LDLDIRECT 122.9 09/05/2013   LDLCALC 175 (H) 07/02/2015   ALT 21 01/26/2016   AST 28 01/26/2016   NA 138 01/26/2016   K 4.8 01/26/2016   CL 103 01/26/2016   CREATININE 1.27 (H) 01/26/2016   BUN 31 (H) 01/26/2016   CO2 28 01/26/2016   TSH 0.73 07/02/2015    Dg Chest 2 View  Result Date: 10/14/2012 *RADIOLOGY REPORT* Clinical Data: Fever.  Upper chest discomfort. CHEST - 2 VIEW Comparison: None. Findings: Minimal linear atelectasis or scar is seen in the left lung base.  The lungs  are otherwise clear.  There is cardiomegaly. No pneumothorax or pleural fluid.  Remote appearing T8, T9 and T12 compression fracture deformities are noted. IMPRESSION: No acute finding.  Cardiomegaly. Original Report Authenticated By: Bernadene Bell. Maricela Curet, M.D.  Ct Abdomen Pelvis W Contrast  Result Date: 10/15/2012 *RADIOLOGY REPORT* Clinical Data: Right lower quadrant pain. CT ABDOMEN AND PELVIS WITH CONTRAST Technique:  Multidetector CT imaging of the abdomen and pelvis was performed following the standard protocol during bolus administration of intravenous contrast. Contrast: 80mL OMNIPAQUE IOHEXOL 300 MG/ML  SOLN Comparison: None. Findings: There is some atelectatic change or scar in the lung bases.  Cardiomegaly is noted.  There is no pleural or pericardial effusion. Several small stones identified within the gallbladder but there is no pericholecystic fluid or wall thickening.  Intrahepatic bile  ducts are minimally prominent.  The liver is otherwise unremarkable.  The common bile duct and pancreatic duct appear normal.  The spleen, adrenal glands and left kidney are unremarkable.  A right renal cyst measures 3.4 cm in diameter. Uterus, adnexa and urinary bladder are unremarkable.  A very small amount of free pelvic fluid is identified.  The stomach and small and large bowel and appendix are unremarkable.  Mild superior endplate compression fractures of T11 and T8 appear remote.  The patient has multilevel degenerative disease in the visualized spine.  No lytic or sclerotic lesion is identified. IMPRESSION: 1.  Trace amount of free pelvic fluid of uncertain etiology. Question gastroenteritis. The appendix appears normal. 2.  Multiple small gallstones without evidence of cholecystitis. 3.  Minimal prominence of the intrahepatic biliary ducts may be related to the patient's age.  Correlation with liver function test could be used for further evaluation. 4.  Cardiomegaly. Original Report Authenticated By: Bernadene BellHOMAS L. Maricela Curet'ALESSIO, M.D.    Assessment & Plan:   Elease Hashimotoatricia was seen today for foot swelling.  Diagnoses and all orders for this visit:  Cellulitis of right lower extremity -     doxycycline (VIBRA-TABS) 100 MG tablet; Take 1 tablet (100 mg total) by mouth 2 (two) times daily.  Essential hypertension -     amLODipine (NORVASC) 10 MG tablet; Take 1 tablet (10 mg total) by mouth at bedtime. -     atenolol (TENORMIN) 25 MG tablet; Take 1 tablet (25 mg total) by mouth 2 (two) times daily.  Localized edema -     furosemide (LASIX) 20 MG tablet; Take 0.5 tablets (10 mg total) by mouth daily. -     potassium chloride (K-DUR) 10 MEQ tablet; Take 1 tablet (10 mEq total) by mouth daily.   I have discontinued Ms. Pla's ramipril and atorvastatin. I have also changed her amLODipine and atenolol. Additionally, I am having her start on doxycycline, furosemide, and potassium chloride.  Meds ordered  this encounter  Medications  . doxycycline (VIBRA-TABS) 100 MG tablet    Sig: Take 1 tablet (100 mg total) by mouth 2 (two) times daily.    Dispense:  14 tablet    Refill:  0    Order Specific Question:   Supervising Provider    Answer:   Pincus SanesBURNS, STACY J [[id number]]  . amLODipine (NORVASC) 10 MG tablet    Sig: Take 1 tablet (10 mg total) by mouth at bedtime.    Dispense:  30 tablet    Refill:  5    Order Specific Question:   Supervising Provider    Answer:   Pincus SanesBURNS, STACY J [[id number]]  . atenolol (TENORMIN) 25 MG tablet    Sig: Take 1 tablet (25 mg total) by mouth 2 (two) times daily.    Dispense:  60 tablet    Refill:  5    Order Specific Question:   Supervising Provider  Answer:   Pincus Sanes [id number]  . furosemide (LASIX) 20 MG tablet    Sig: Take 0.5 tablets (10 mg total) by mouth daily.    Dispense:  3 tablet    Refill:  0    Order Specific Question:   Supervising Provider    Answer:   Pincus Sanes [[id number]]  . potassium chloride (K-DUR) 10 MEQ tablet    Sig: Take 1 tablet (10 mEq total) by mouth daily.    Dispense:  6 tablet    Refill:  0    Order Specific Question:   Supervising Provider    Answer:   Pincus Sanes [[id number]]    Follow-up: Return in about 1 week (around 06/14/2017) for cellulitis and HTN.  Alysia Penna, NP

## 2017-06-13 ENCOUNTER — Ambulatory Visit: Payer: Medicare Other | Admitting: Internal Medicine

## 2017-06-14 ENCOUNTER — Other Ambulatory Visit (INDEPENDENT_AMBULATORY_CARE_PROVIDER_SITE_OTHER): Payer: Medicare Other

## 2017-06-14 ENCOUNTER — Ambulatory Visit (INDEPENDENT_AMBULATORY_CARE_PROVIDER_SITE_OTHER): Payer: Medicare Other | Admitting: Nurse Practitioner

## 2017-06-14 ENCOUNTER — Encounter: Payer: Self-pay | Admitting: Nurse Practitioner

## 2017-06-14 VITALS — BP 146/86 | HR 51 | Temp 97.6°F | Ht 63.0 in | Wt 120.0 lb

## 2017-06-14 DIAGNOSIS — M7989 Other specified soft tissue disorders: Secondary | ICD-10-CM | POA: Diagnosis not present

## 2017-06-14 DIAGNOSIS — L03115 Cellulitis of right lower limb: Secondary | ICD-10-CM | POA: Diagnosis not present

## 2017-06-14 DIAGNOSIS — I10 Essential (primary) hypertension: Secondary | ICD-10-CM

## 2017-06-14 DIAGNOSIS — M79661 Pain in right lower leg: Secondary | ICD-10-CM | POA: Diagnosis not present

## 2017-06-14 LAB — BASIC METABOLIC PANEL
BUN: 36 mg/dL — AB (ref 6–23)
CALCIUM: 10.2 mg/dL (ref 8.4–10.5)
CHLORIDE: 97 meq/L (ref 96–112)
CO2: 25 meq/L (ref 19–32)
CREATININE: 1.45 mg/dL — AB (ref 0.40–1.20)
GFR: 36.48 mL/min — ABNORMAL LOW (ref >60.00)
Glucose, Bld: 96 mg/dL (ref 70–99)
Potassium: 5.2 mEq/L — ABNORMAL HIGH (ref 3.5–5.1)
Sodium: 131 mEq/L — ABNORMAL LOW (ref 135–145)

## 2017-06-14 MED ORDER — CEPHALEXIN 500 MG PO CAPS: 500.0000 mg | ORAL_CAPSULE | Freq: Three times a day (TID) | ORAL | 0 refills | Status: DC

## 2017-06-14 MED ORDER — AMLODIPINE BESYLATE 10 MG PO TABS: 5.0000 mg | ORAL_TABLET | Freq: Every evening | ORAL | 5 refills | Status: DC

## 2017-06-14 NOTE — Patient Instructions (Addendum)
Decrease amlodipine to half tab once a day with food. Continue BP check once a day at record. Bring BP readings to next office visit.  You will be contacted to schedule leg ultrasound.  Go to basement for blood draw. You will be contacted with lab results.  Start cephalexin (oral antibiotics) for cellulitis. (take with food). Elevate leg as much as possible. Maintain low salt diet.

## 2017-06-14 NOTE — Progress Notes (Signed)
Subjective:  Patient ID: Charlene Garza, female    DOB: May 14, 1930  Age: 83 y.o. MRN: [id number]  CC: Leg Pain (1 wk fu/right leg still painful and swelling/med consult)   HPI  HTN: Report ABD pain with PM dose of amlodipine. Home BP readings of 120-130s/70s-80s.  Cellulitis of right leg: Complains of persistent redness, swelling and pain. She has completed doxycycline and furosemide as prescribed.  Outpatient Medications Prior to Visit  Medication Sig Dispense Refill  . atenolol (TENORMIN) 25 MG tablet Take 1 tablet (25 mg total) by mouth 2 (two) times daily. 60 tablet 5  . amLODipine (NORVASC) 10 MG tablet Take 1 tablet (10 mg total) by mouth at bedtime. 30 tablet 5  . doxycycline (VIBRA-TABS) 100 MG tablet Take 1 tablet (100 mg total) by mouth 2 (two) times daily. 14 tablet 0  . furosemide (LASIX) 20 MG tablet Take 0.5 tablets (10 mg total) by mouth daily. 3 tablet 0  . potassium chloride (K-DUR) 10 MEQ tablet Take 1 tablet (10 mEq total) by mouth daily. 6 tablet 0   No facility-administered medications prior to visit.     ROS Review of Systems  Eyes: Negative for blurred vision.  Cardiovascular: Positive for leg swelling. Negative for chest pain and palpitations.  Gastrointestinal: Negative for constipation, diarrhea, nausea and vomiting.  Musculoskeletal: Negative for falls.  Neurological: Negative for dizziness and headaches.     Objective:  BP (!) 146/86   Pulse (!) 51   Temp 97.6 F (36.4 C)   Ht 5\' 3"  (1.6 m)   Wt 120 lb (54.4 kg)   SpO2 99%   BMI 21.26 kg/m   BP Readings from Last 3 Encounters:  06/14/17 (!) 146/86  06/07/17 (!) 198/96  01/26/16 (!) 210/100    Wt Readings from Last 3 Encounters:  06/14/17 120 lb (54.4 kg)  06/07/17 127 lb (57.6 kg)  01/26/16 130 lb 1.9 oz (59 kg)    Physical Exam  Constitutional: She is oriented to person, place, and time. No distress.  Neck: Normal range of motion. Neck supple.  Cardiovascular: Normal rate,  regular rhythm and normal heart sounds.   Pulmonary/Chest: Effort normal and breath sounds normal.  Musculoskeletal: She exhibits edema and tenderness.  Neurological: She is oriented to person, place, and time.  Skin: Skin is warm and dry. There is erythema.  Vitals reviewed.   Lab Results  Component Value Date   WBC 4.8 07/02/2015   HGB 12.4 07/02/2015   HCT 36.5 07/02/2015   PLT 178.0 07/02/2015   GLUCOSE 96 01/26/2016   CHOL 274 (H) 07/02/2015   TRIG 60 07/02/2015   HDL 87 07/02/2015   LDLDIRECT 122.9 09/05/2013   LDLCALC 175 (H) 07/02/2015   ALT 21 01/26/2016   AST 28 01/26/2016   NA 138 01/26/2016   K 4.8 01/26/2016   CL 103 01/26/2016   CREATININE 1.27 (H) 01/26/2016   BUN 31 (H) 01/26/2016   CO2 28 01/26/2016   TSH 0.73 07/02/2015    Dg Chest 2 View  Result Date: 10/14/2012 *RADIOLOGY REPORT* Clinical Data: Fever.  Upper chest discomfort. CHEST - 2 VIEW Comparison: None. Findings: Minimal linear atelectasis or scar is seen in the left lung base.  The lungs  are otherwise clear.  There is cardiomegaly. No pneumothorax or pleural fluid.  Remote appearing T8, T9 and T12 compression fracture deformities are noted. IMPRESSION: No acute finding.  Cardiomegaly. Original Report Authenticated By: Bernadene Bell. Maricela Curet, M.D.   Ct Abdomen Pelvis  W Contrast  Result Date: 10/15/2012 *RADIOLOGY REPORT* Clinical Data: Right lower quadrant pain. CT ABDOMEN AND PELVIS WITH CONTRAST Technique:  Multidetector CT imaging of the abdomen and pelvis was performed following the standard protocol during bolus administration of intravenous contrast. Contrast: 80mL OMNIPAQUE IOHEXOL 300 MG/ML  SOLN Comparison: None. Findings: There is some atelectatic change or scar in the lung bases.  Cardiomegaly is noted.  There is no pleural or pericardial effusion. Several small stones identified within the gallbladder but there is no pericholecystic fluid or wall thickening.  Intrahepatic bile ducts are  minimally prominent.  The liver is otherwise unremarkable.  The common bile duct and pancreatic duct appear normal.  The spleen, adrenal glands and left kidney are unremarkable.  A right renal cyst measures 3.4 cm in diameter. Uterus, adnexa and urinary bladder are unremarkable.  A very small amount of free pelvic fluid is identified.  The stomach and small and large bowel and appendix are unremarkable.  Mild superior endplate compression fractures of T11 and T8 appear remote.  The patient has multilevel degenerative disease in the visualized spine.  No lytic or sclerotic lesion is identified. IMPRESSION: 1.  Trace amount of free pelvic fluid of uncertain etiology. Question gastroenteritis. The appendix appears normal. 2.  Multiple small gallstones without evidence of cholecystitis. 3.  Minimal prominence of the intrahepatic biliary ducts may be related to the patient's age.  Correlation with liver function test could be used for further evaluation. 4.  Cardiomegaly. Original Report Authenticated By: Bernadene BellHOMAS L. Maricela Curet'ALESSIO, M.D.    Assessment & Plan:   Elease Hashimotoatricia was seen today for leg pain.  Diagnoses and all orders for this visit:  Cellulitis of right lower extremity -     VAS US LOWER EXTREMITY VENOUS (DVT); Future -     Basic metabolic panel; Future -     cephALEXin (KEFLEX) 500 MG capsule; Take 1 capsule (500 mg total) by mouth 3 (three) times daily.  Essential hypertension -     Basic metabolic panel; Future -     amLODipine (NORVASC) 10 MG tablet; Take 0.5 tablets (5 mg total) by mouth every evening. With food  Pain and swelling of lower leg, right -     VAS US LOWER EXTREMITY VENOUS (DVT); Future   I have discontinued Ms. Finnan's doxycycline, furosemide, and potassium chloride. I have also changed her amLODipine. Additionally, I am having her start on cephALEXin. Lastly, I am having her maintain her atenolol.  Meds ordered this encounter  Medications  . cephALEXin (KEFLEX) 500 MG capsule     Sig: Take 1 capsule (500 mg total) by mouth 3 (three) times daily.    Dispense:  21 capsule    Refill:  0    Order Specific Question:   Supervising Provider    Answer:   Tresa GarterPLOTNIKOV, ALEKSEI V [[id number]]  . amLODipine (NORVASC) 10 MG tablet    Sig: Take 0.5 tablets (5 mg total) by mouth every evening. With food    Dispense:  30 tablet    Refill:  5    Order Specific Question:   Supervising Provider    Answer:   Tresa GarterPLOTNIKOV, ALEKSEI V [[id number]]    Follow-up: Return in about 2 weeks (around 06/28/2017) for right leg cellulitis.  Alysia Pennaharlotte Deneen Slager, NP

## 2017-06-18 ENCOUNTER — Telehealth: Payer: Self-pay | Admitting: Internal Medicine

## 2017-06-18 NOTE — Telephone Encounter (Signed)
Patient called and is very confused about what she needs to have this DVT for. And she would like her lab results. It looks like that place may have called her already? But she asked for the nurse to call her daughter and give her all the information.

## 2017-06-18 NOTE — Telephone Encounter (Signed)
[facility]

## 2017-06-19 ENCOUNTER — Ambulatory Visit (INDEPENDENT_AMBULATORY_CARE_PROVIDER_SITE_OTHER): Payer: Medicare Other | Admitting: Adult Health

## 2017-06-19 ENCOUNTER — Ambulatory Visit (HOSPITAL_COMMUNITY)
Admission: RE | Admit: 2017-06-19 | Discharge: 2017-06-19 | Disposition: A | Discharge status: Home / Self Care | Payer: Medicare Other | Source: Ambulatory Visit | Attending: Cardiology | Admitting: Cardiology

## 2017-06-19 ENCOUNTER — Encounter: Payer: Self-pay | Admitting: Adult Health

## 2017-06-19 VITALS — BP 122/72 | HR 55 | Temp 98.0°F | Ht 63.0 in | Wt 122.0 lb

## 2017-06-19 DIAGNOSIS — L03115 Cellulitis of right lower limb: Secondary | ICD-10-CM

## 2017-06-19 DIAGNOSIS — M7989 Other specified soft tissue disorders: Secondary | ICD-10-CM | POA: Diagnosis not present

## 2017-06-19 DIAGNOSIS — I10 Essential (primary) hypertension: Secondary | ICD-10-CM

## 2017-06-19 DIAGNOSIS — N289 Disorder of kidney and ureter, unspecified: Secondary | ICD-10-CM | POA: Diagnosis not present

## 2017-06-19 DIAGNOSIS — S81802D Unspecified open wound, left lower leg, subsequent encounter: Secondary | ICD-10-CM

## 2017-06-19 DIAGNOSIS — M79661 Pain in right lower leg: Secondary | ICD-10-CM | POA: Diagnosis not present

## 2017-06-19 LAB — BASIC METABOLIC PANEL WITH GFR
BUN: 36 mg/dL — ABNORMAL HIGH (ref 7–25)
CHLORIDE: 101 mmol/L (ref 98–110)
CO2: 22 mmol/L (ref 20–31)
CREATININE: 1.37 mg/dL — AB (ref 0.60–0.88)
Calcium: 9.7 mg/dL (ref 8.6–10.4)
GFR, Est African American: 41 mL/min — ABNORMAL LOW (ref >=60)
GFR, Est Non African American: 35 mL/min — ABNORMAL LOW (ref >=60)
Glucose, Bld: 88 mg/dL (ref 65–99)
Potassium: 4.7 mmol/L (ref 3.5–5.3)
SODIUM: 136 mmol/L (ref 135–146)

## 2017-06-19 NOTE — Progress Notes (Signed)
Subjective:    Patient ID: Charlene Garza, female    DOB: [birth year], 83 y.o.   MRN: [id number]  HPI  83 year old female who  has a past medical history of Anemia; Arthritis; Hyperlipidemia; Hypertension; Osteopenia; and Renal insufficiency. She is a patient of Dr. Sharlet Salina who I am seeing today for the first time for an acute issue.   She reports that recently she had her blood pressure medication changed. She was taking Norvasc 10 mg PRN but that this was changed to nightly use do to elevated blood pressure. She reports today that she feels as though her blood pressure is getting too low. Her readings show blood pressures over the last 24-36 hours of being in the low 100's/70's. She denies any issues with dizziness or lightheadedness.   She is also concerned about a non healing wound to her left leg for which she is currently taking Keflex. Wound has been presents for greater than one month   Review of Systems See HPI   Past Medical History:  Diagnosis Date  . Anemia   . Arthritis   . Hyperlipidemia   . Hypertension   . Osteopenia    T score : - 2.4 @ R femoral neck  . Renal insufficiency     Social History   Social History  . Marital status: Married    Spouse name: N/A  . Number of children: N/A  . Years of education: N/A   Occupational History  . Not on file.   Social History Main Topics  . Smoking status: Never Smoker  . Smokeless tobacco: Never Used  . Alcohol use 4.2 oz/week    7 Shots of liquor per week  . Drug use: No  . Sexual activity: Not Currently   Other Topics Concern  . Not on file   Social History Narrative  . No narrative on file    Past Surgical History:  Procedure Laterality Date  . CATARACT EXTRACTION     bilaterally  . CHOLECYSTECTOMY  10/17/2012   Procedure: LAPAROSCOPIC CHOLECYSTECTOMY WITH INTRAOPERATIVE CHOLANGIOGRAM;  Surgeon: Ralene Ok, MD;  Location: [facility];  Service: General;  Laterality: N/A;  . KNEE SURGERY  10/2003   L  TKR    Family History  Problem Relation Age of Onset  . Hypertension Father   . Hypertension Mother   . Heart attack Mother 49  . Hypertension Brother   . Diabetes Neg Hx   . Stroke Neg Hx     Allergies  Allergen Reactions  . Ambien Cr [Zolpidem Tartrate Er]     10/2012 mental status changes postoperatively  . Lovastatin     10/02/13 CK 276    Current Outpatient Prescriptions on File Prior to Visit  Medication Sig Dispense Refill  . amLODipine (NORVASC) 10 MG tablet Take 0.5 tablets (5 mg total) by mouth every evening. With food 30 tablet 5  . atenolol (TENORMIN) 25 MG tablet Take 1 tablet (25 mg total) by mouth 2 (two) times daily. 60 tablet 5  . cephALEXin (KEFLEX) 500 MG capsule Take 1 capsule (500 mg total) by mouth 3 (three) times daily. 21 capsule 0   No current facility-administered medications on file prior to visit.     BP 122/72 (BP Location: Left Arm, Patient Position: Sitting, Cuff Size: Normal)   Pulse (!) 55   Temp 98 F (36.7 C) (Oral)   Ht 5' 3" (1.6 m)   Wt 122 lb (55.3 kg)   SpO2 97%  BMI 21.61 kg/m       Objective:   Physical Exam  Constitutional: She is oriented to person, place, and time. She appears well-developed and well-nourished. No distress.  Cardiovascular: Normal rate, regular rhythm, normal heart sounds and intact distal pulses.  Exam reveals no gallop and no friction rub.   No murmur heard. Pulmonary/Chest: Effort normal and breath sounds normal. No respiratory distress. She has no wheezes. She has no rales. She exhibits no tenderness.  Abdominal: Soft. Bowel sounds are normal. She exhibits no distension and no mass. There is no tenderness. There is no rebound and no guarding.  Musculoskeletal: Normal range of motion.  Neurological: She is alert and oriented to person, place, and time.  Skin: Skin is warm and dry. No rash noted. She is not diaphoretic. No erythema. No pallor.  Small wound on left shin. No redness, warmth, or drainage  noted. Mild non pitting edema noted in left lower extremity     Psychiatric: She has a normal mood and affect. Her behavior is normal. Judgment and thought content normal.  Nursing note and vitals reviewed.     Assessment & Plan:  1. Essential hypertension - I am going to have her try taking Norvasc 5 mg PRN for blood pressure readings above 759 systolic.  - BMP with eGFR - Follow up in one week   2. Wound of left lower extremity, subsequent encounter - Continue with abx - can put triple abx ointment and bandaid over wound  - Follow up as needed  Dorothyann Peng, NP

## 2017-06-21 ENCOUNTER — Encounter (HOSPITAL_COMMUNITY): Payer: Medicare Other

## 2017-06-21 NOTE — Telephone Encounter (Signed)
Left massage for Charlene Garza (daughter--ok to speak to her per pt) to call back.

## 2017-06-28 ENCOUNTER — Ambulatory Visit: Payer: Medicare Other | Admitting: Nurse Practitioner

## 2017-06-28 DIAGNOSIS — M1711 Unilateral primary osteoarthritis, right knee: Secondary | ICD-10-CM | POA: Diagnosis not present

## 2017-07-17 ENCOUNTER — Encounter: Payer: Self-pay | Admitting: Family Medicine

## 2017-07-17 ENCOUNTER — Ambulatory Visit (INDEPENDENT_AMBULATORY_CARE_PROVIDER_SITE_OTHER): Payer: Medicare Other | Admitting: Family Medicine

## 2017-07-17 VITALS — BP 146/90 | HR 56 | Temp 97.6°F | Ht 63.0 in | Wt 124.0 lb

## 2017-07-17 DIAGNOSIS — I83009 Varicose veins of unspecified lower extremity with ulcer of unspecified site: Secondary | ICD-10-CM

## 2017-07-17 DIAGNOSIS — I872 Venous insufficiency (chronic) (peripheral): Secondary | ICD-10-CM | POA: Insufficient documentation

## 2017-07-17 DIAGNOSIS — L97909 Non-pressure chronic ulcer of unspecified part of unspecified lower leg with unspecified severity: Secondary | ICD-10-CM

## 2017-07-17 MED ORDER — GABAPENTIN 100 MG PO CAPS: 100.0000 mg | ORAL_CAPSULE | Freq: Three times a day (TID) | ORAL | 1 refills | Status: DC

## 2017-07-17 NOTE — Patient Instructions (Signed)
Thank you for coming in,   I [place] Hopefully they can get you and fairly soon. Please let us know if you have not heard from them.  Start the gabapentin by taking 100 mg at night for a few days. You can increase this to twice a day and even to 3 times per day. It can make you drowsy so make sure your not doing anything the next day.   Please feel free to call with any questions or concerns at any time, at [phone]. --Dr. Jordan LikesSchmitz

## 2017-07-17 NOTE — Assessment & Plan Note (Addendum)
She has significant swelling and a delayed healing ulcer on her anterior tibia. - Referral to wound care center - Initiated gabapentin 100 mg at night. She has some chronic kidney disease and would not increase more than 100 mg 3 times a day.

## 2017-07-17 NOTE — Progress Notes (Signed)
Charlene Garza - 83 y.o. female MRN [id number]  Date of birth: [birth year]  SUBJECTIVE:  Including CC & ROS.  Chief Complaint  Patient presents with  . Leg Pain    Cut leg on dishwasher a few months ago, not healing and has shooting pain in her shin    Charlene Garza is an 83 year old female that is presenting with a cervical on the anterior aspect of her right lower leg. She reports this is been present for about 2 months. It has healed slowly and not completely. She also has some pain around that area that is shooting in nature. She describes it as a nerve pain. She has some swelling of her lower extremities on both sides. She has no history of heart failure. She is not currently taking amlodipine on a regular basis. She has tried Lasix for the swelling with no improvement.     Review of Systems  Musculoskeletal: Positive for myalgias. Negative for gait problem.  Skin: Positive for wound.  Neurological: Negative for weakness and numbness.   otherwise negative  HISTORY: Past Medical, Surgical, Social, and Family History Reviewed & Updated per EMR.   Pertinent Historical Findings include:  Past Medical History:  Diagnosis Date  . Anemia   . Arthritis   . Hyperlipidemia   . Hypertension   . Osteopenia    T score : - 2.4 @ R femoral neck  . Renal insufficiency     Past Surgical History:  Procedure Laterality Date  . CATARACT EXTRACTION     bilaterally  . CHOLECYSTECTOMY  10/17/2012   Procedure: LAPAROSCOPIC CHOLECYSTECTOMY WITH INTRAOPERATIVE CHOLANGIOGRAM;  Surgeon: Axel FillerArmando Ramirez, MD;  Location: [facility];  Service: General;  Laterality: N/A;  . KNEE SURGERY  10/2003   L TKR    Allergies  Allergen Reactions  . Ambien Cr [Zolpidem Tartrate Er]     10/2012 mental status changes postoperatively  . Lovastatin     10/02/13 CK 276    Family History  Problem Relation Age of Onset  . Hypertension Father   . Hypertension Mother   . Heart attack Mother [id number]  . Hypertension Brother    . Diabetes Neg Hx   . Stroke Neg Hx      Social History   Social History  . Marital status: Married    Spouse name: N/A  . Number of children: N/A  . Years of education: N/A   Occupational History  . Not on file.   Social History Main Topics  . Smoking status: Never Smoker  . Smokeless tobacco: Never Used  . Alcohol use 4.2 oz/week    7 Shots of liquor per week  . Drug use: No  . Sexual activity: Not Currently   Other Topics Concern  . Not on file   Social History Narrative  . No narrative on file     PHYSICAL EXAM:  VS: BP (!) 146/90 (BP Location: Right Arm, Patient Position: Sitting, Cuff Size: Normal)   Pulse (!) 56   Temp 97.6 F (36.4 C) (Oral)   Ht 5\' 3"  (1.6 m)   Wt 124 lb (56.2 kg)   SpO2 100%   BMI 21.97 kg/m  Physical Exam Gen: NAD, alert, cooperative with exam, well-appearing ENT: normal lips, normal nasal mucosa,  Eye: PERRL, normal conjunctiva and lids CV:  +2 pitting edema, +2 pedal pulses   Resp: no accessory muscle use, non-labored,  Skin: Some venous stasis changes on the right lower extremity. Healing ulcer on the right anterior  tibia, non-weeping and no drainage, no erythema, Neuro: normal tone, normal sensation to touch Psych:  normal insight, alert and oriented MSK: normal gait, normal range of motion   ASSESSMENT & PLAN:   I spent 25 minutes with this patient, greater than 50% was face-to-face time counseling regarding the below diagnosis.   Venous ulcer (HCC) She has significant swelling and a delayed healing ulcer on her anterior tibia. - Referral to wound care center - Initiated gabapentin 100 mg at night. She has some chronic kidney disease and would not increase more than 100 mg 3 times a day.

## 2017-07-20 DIAGNOSIS — L97911 Non-pressure chronic ulcer of unspecified part of right lower leg limited to breakdown of skin: Secondary | ICD-10-CM | POA: Diagnosis not present

## 2017-07-20 DIAGNOSIS — S81801D Unspecified open wound, right lower leg, subsequent encounter: Secondary | ICD-10-CM | POA: Diagnosis not present

## 2017-07-20 DIAGNOSIS — I872 Venous insufficiency (chronic) (peripheral): Secondary | ICD-10-CM | POA: Diagnosis not present

## 2017-07-26 ENCOUNTER — Ambulatory Visit: Payer: Medicare Other | Admitting: Physician Assistant

## 2017-07-27 DIAGNOSIS — S81801D Unspecified open wound, right lower leg, subsequent encounter: Secondary | ICD-10-CM | POA: Diagnosis not present

## 2017-07-27 DIAGNOSIS — L97911 Non-pressure chronic ulcer of unspecified part of right lower leg limited to breakdown of skin: Secondary | ICD-10-CM | POA: Diagnosis not present

## 2017-07-27 DIAGNOSIS — I872 Venous insufficiency (chronic) (peripheral): Secondary | ICD-10-CM | POA: Diagnosis not present

## 2017-08-02 DIAGNOSIS — L97911 Non-pressure chronic ulcer of unspecified part of right lower leg limited to breakdown of skin: Secondary | ICD-10-CM | POA: Diagnosis not present

## 2017-10-02 ENCOUNTER — Ambulatory Visit (INDEPENDENT_AMBULATORY_CARE_PROVIDER_SITE_OTHER): Payer: Medicare Other | Admitting: General Practice

## 2017-10-02 DIAGNOSIS — Z23 Encounter for immunization: Secondary | ICD-10-CM

## 2017-11-22 ENCOUNTER — Ambulatory Visit: Payer: Medicare Other | Admitting: Internal Medicine

## 2017-11-22 ENCOUNTER — Encounter: Payer: Self-pay | Admitting: Internal Medicine

## 2017-11-22 ENCOUNTER — Other Ambulatory Visit (INDEPENDENT_AMBULATORY_CARE_PROVIDER_SITE_OTHER): Payer: Medicare Other

## 2017-11-22 VITALS — BP 150/100 | HR 70 | Temp 97.7°F | Ht 63.0 in | Wt 118.0 lb

## 2017-11-22 DIAGNOSIS — I872 Venous insufficiency (chronic) (peripheral): Secondary | ICD-10-CM | POA: Diagnosis not present

## 2017-11-22 LAB — TSH: TSH: 0.61 u[IU]/mL (ref 0.35–4.50)

## 2017-11-22 LAB — HEMOGLOBIN A1C: Hgb A1c MFr Bld: 5.5 % (ref 4.6–6.5)

## 2017-11-22 LAB — T4, FREE: Free T4: 0.96 ng/dL (ref 0.60–1.60)

## 2017-11-22 LAB — VITAMIN D 25 HYDROXY (VIT D DEFICIENCY, FRACTURES): VITD: 37.93 ng/mL (ref 30.00–100.00)

## 2017-11-22 LAB — VITAMIN B12: Vitamin B-12: 430 pg/mL (ref 211–911)

## 2017-11-22 NOTE — Assessment & Plan Note (Signed)
Checking labs but reassurance given. Has compression stockings which are tender to her legs. Advised to prop legs up. Would not adjust medications.

## 2017-11-22 NOTE — Progress Notes (Signed)
   Subjective:    Patient ID: Charlene Garza, female    DOB: February 08, 1930, 83 y.o.   MRN: [id number]  HPI The patient is an 83 YO female coming in for right foot swelling. Started some time ago and is worse some times or better some times. She had a gash which has healed that she did on the dishwasher. She ended up needed wound management. She is having some swelling in her leg in that area which worsens with standing or on her feet. In the morning feet are normal. She denies pain except rare.   Review of Systems  Constitutional: Negative.   Respiratory: Negative for cough, chest tightness and shortness of breath.   Cardiovascular: Positive for leg swelling. Negative for chest pain and palpitations.  Gastrointestinal: Negative for abdominal distention, abdominal pain, constipation, diarrhea, nausea and vomiting.  Musculoskeletal: Negative.   Skin: Negative.   Neurological: Negative.   Psychiatric/Behavioral: Negative.       Objective:   Physical Exam  Constitutional: She is oriented to person, place, and time. She appears well-developed and well-nourished.  HENT:  Head: Normocephalic and atraumatic.  Eyes: EOM are normal.  Neck: Normal range of motion.  Cardiovascular: Normal rate and regular rhythm.  Pulmonary/Chest: Effort normal and breath sounds normal. No respiratory distress. She has no wheezes. She has no rales.  Abdominal: Soft.  Musculoskeletal: She exhibits edema.  Neurological: She is alert and oriented to person, place, and time. Coordination normal.  Skin: Skin is warm and dry.  Wound healed on the right shin, some tenderness to palpation and 1+ edema to mid shin right   Vitals:   11/22/17 1315  BP: (!) 150/100  Pulse: 70  Temp: 97.7 F (36.5 C)  TempSrc: Oral  SpO2: 98%  Weight: 118 lb (53.5 kg)  Height: 5\' 3"  (1.6 m)      Assessment & Plan:

## 2017-11-22 NOTE — Patient Instructions (Signed)
We will check the blood work today.   Chronic Venous Insufficiency Chronic venous insufficiency, also called venous stasis, is a condition that prevents blood from being pumped effectively through the veins in your legs. Blood may no longer be pumped effectively from the legs back to the heart. This condition can range from mild to severe. With proper treatment, you should be able to continue with an active life. What are the causes? Chronic venous insufficiency occurs when the vein walls become stretched, weakened, or damaged, or when valves within the vein are damaged. Some common causes of this include:  High blood pressure inside the veins (venous hypertension).  Increased blood pressure in the leg veins from long periods of sitting or standing.  A blood clot that blocks blood flow in a vein (deep vein thrombosis, DVT).  Inflammation of a vein (phlebitis) that causes a blood clot to form.  Tumors in the pelvis that cause blood to back up.  What increases the risk? The following factors may make you more likely to develop this condition:  Having a family history of this condition.  Obesity.  Pregnancy.  Living without enough physical activity or exercise (sedentary lifestyle).  Smoking.  Having a job that requires long periods of standing or sitting in one place.  Being a certain age. Women in their 8240s and 7850s and men in their 1770s are more likely to develop this condition.  What are the signs or symptoms? Symptoms of this condition include:  Veins that are enlarged, bulging, or twisted (varicose veins).  Skin breakdown or ulcers.  Reddened or discolored skin on the front of the leg.  Brown, smooth, tight, and painful skin just above the ankle, usually on the inside of the leg (lipodermatosclerosis).  Swelling.  How is this diagnosed? This condition may be diagnosed based on:  Your medical history.  A physical exam.  Tests, such as: ? A procedure that  creates an image of a blood vessel and nearby organs and provides information about blood flow through the blood vessel (duplex ultrasound). ? A procedure that tests blood flow (plethysmography). ? A procedure to look at the veins using X-ray and dye (venogram).  How is this treated? The goals of treatment are to help you return to an active life and to minimize pain or disability. Treatment depends on the severity of your condition, and it may include:  Wearing compression stockings. These can help relieve symptoms and help prevent your condition from getting worse. However, they do not cure the condition.  Sclerotherapy. This is a procedure involving an injection of a material that "dissolves" damaged veins.  Surgery. This may involve: ? Removing a diseased vein (vein stripping). ? Cutting off blood flow through the vein (laser ablation surgery). ? Repairing a valve.  Follow these instructions at home:  Wear compression stockings as told by your health care provider. These stockings help to prevent blood clots and reduce swelling in your legs.  Take over-the-counter and prescription medicines only as told by your health care provider.  Stay active by exercising, walking, or doing different activities. Ask your health care provider what activities are safe for you and how much exercise you need.  Drink enough fluid to keep your urine clear or pale yellow.  Do not use any products that contain nicotine or tobacco, such as cigarettes and e-cigarettes. If you need help quitting, ask your health care provider.  Keep all follow-up visits as told by your health care provider. This is  important. Contact a health care provider if:  You have redness, swelling, or more pain in the affected area.  You see a red streak or line that extends up or down from the affected area.  You have skin breakdown or a loss of skin in the affected area, even if the breakdown is small.  You get an injury in  the affected area. Get help right away if:  You get an injury and an open wound in the affected area.  You have severe pain that does not get better with medicine.  You have sudden numbness or weakness in the foot or ankle below the affected area, or you have trouble moving your foot or ankle.  You have a fever and you have worse or persistent symptoms.  You have chest pain.  You have shortness of breath. Summary  Chronic venous insufficiency, also called venous stasis, is a condition that prevents blood from being pumped effectively through the veins in your legs.  Chronic venous insufficiency occurs when the vein walls become stretched, weakened, or damaged, or when valves within the vein are damaged.  Treatment for this condition depends on how severe your condition is, and it may involve wearing compression stockings or having a procedure.  Make sure you stay active by exercising, walking, or doing different activities. Ask your health care provider what activities are safe for you and how much exercise you need. This information is not intended to replace advice given to you by your health care provider. Make sure you discuss any questions you have with your health care provider. Document Released: 04/09/2007 Document Revised: 10/23/2016 Document Reviewed: 10/23/2016 Elsevier Interactive Patient Education  2017 [employer].

## 2018-01-03 ENCOUNTER — Ambulatory Visit: Payer: Medicare Other | Admitting: Internal Medicine

## 2018-01-23 ENCOUNTER — Ambulatory Visit (INDEPENDENT_AMBULATORY_CARE_PROVIDER_SITE_OTHER)
Admission: RE | Admit: 2018-01-23 | Discharge: 2018-01-23 | Disposition: A | Discharge status: Home / Self Care | Payer: Medicare Other | Source: Ambulatory Visit | Attending: Internal Medicine | Admitting: Internal Medicine

## 2018-01-23 ENCOUNTER — Ambulatory Visit (INDEPENDENT_AMBULATORY_CARE_PROVIDER_SITE_OTHER): Payer: Medicare Other | Admitting: Internal Medicine

## 2018-01-23 ENCOUNTER — Encounter: Payer: Self-pay | Admitting: Internal Medicine

## 2018-01-23 VITALS — BP 148/92 | HR 63 | Temp 97.6°F | Ht 63.0 in | Wt 123.4 lb

## 2018-01-23 DIAGNOSIS — M159 Polyosteoarthritis, unspecified: Secondary | ICD-10-CM

## 2018-01-23 DIAGNOSIS — E785 Hyperlipidemia, unspecified: Secondary | ICD-10-CM

## 2018-01-23 DIAGNOSIS — E2839 Other primary ovarian failure: Secondary | ICD-10-CM

## 2018-01-23 DIAGNOSIS — Z0000 Encounter for general adult medical examination without abnormal findings: Secondary | ICD-10-CM

## 2018-01-23 DIAGNOSIS — M150 Primary generalized (osteo)arthritis: Secondary | ICD-10-CM

## 2018-01-23 DIAGNOSIS — I10 Essential (primary) hypertension: Secondary | ICD-10-CM

## 2018-01-23 DIAGNOSIS — M8949 Other hypertrophic osteoarthropathy, multiple sites: Secondary | ICD-10-CM

## 2018-01-23 MED ORDER — HYDROCHLOROTHIAZIDE 25 MG PO TABS: 25.0000 mg | ORAL_TABLET | Freq: Every day | ORAL | 3 refills | Status: DC

## 2018-01-23 MED ORDER — HYDROCODONE-ACETAMINOPHEN 5-325 MG PO TABS: 1.0000 | ORAL_TABLET | Freq: Four times a day (QID) | ORAL | 0 refills | Status: DC | PRN

## 2018-01-23 NOTE — Progress Notes (Signed)
   Subjective:    Patient ID: Charlene Garza, female    DOB: 07-24-1930, 84 y.o.   MRN: [id number]  HPI Here for medicare wellness and physical, no new complaints. Please see A/P for status and treatment of chronic medical problems.   [place] narcotic database reviewed and no inappropriate fills.   Diet: heart healthy Physical activity: sedentary Depression/mood screen: negative Hearing: intact to whispered voice Visual acuity: grossly normal, performs annual eye exam  ADLs: capable Fall risk: none Home safety: good Cognitive evaluation: intact to orientation, naming, recall and repetition EOL planning: adv directives discussed  I have personally reviewed and have noted 1. The patient's medical and social history - reviewed today no changes 2. Their use of alcohol, tobacco or illicit drugs 3. Their current medications and supplements 4. The patient's functional ability including ADL's, fall risks, home safety risks and hearing or visual impairment. 5. Diet and physical activities 6. Evidence for depression or mood disorders 7. Care team reviewed and updated (available in snapshot)  Review of Systems  Constitutional: Negative.   HENT: Negative.   Eyes: Negative.   Respiratory: Negative for cough, chest tightness and shortness of breath.   Cardiovascular: Negative for chest pain, palpitations and leg swelling.  Gastrointestinal: Negative for abdominal distention, abdominal pain, constipation, diarrhea, nausea and vomiting.  Musculoskeletal: Negative.   Skin: Negative.   Neurological: Negative.   Psychiatric/Behavioral: Negative.       Objective:   Physical Exam  Constitutional: She is oriented to person, place, and time. She appears well-developed and well-nourished.  HENT:  Head: Normocephalic and atraumatic.  Eyes: EOM are normal.  Neck: Normal range of motion.  Cardiovascular: Normal rate and regular rhythm.  Pulmonary/Chest: Effort normal and breath sounds normal. No  respiratory distress. She has no wheezes. She has no rales.  Abdominal: Soft. Bowel sounds are normal. She exhibits no distension. There is no tenderness. There is no rebound.  Musculoskeletal: She exhibits no edema.  Neurological: She is alert and oriented to person, place, and time. Coordination normal.  Skin: Skin is warm and dry.  Psychiatric: She has a normal mood and affect.   Vitals:   01/23/18 1004  BP: (!) 148/92  Pulse: 63  Temp: 97.6 F (36.4 C)  TempSrc: Oral  SpO2: 96%  Weight: 123 lb 6.4 oz (56 kg)  Height: 5\' 3"  (1.6 m)      Assessment & Plan:

## 2018-01-23 NOTE — Patient Instructions (Signed)
We have sent in the fluid pill called hctz (hydrochlorothiaizide) to take 1 pill daily. This is a fluid pill and may make you urinate more often. Stop taking the atenolol.   If you have problems with this let us know and we can always change back.  Health Maintenance, Female Adopting a healthy lifestyle and getting preventive care can go a long way to promote health and wellness. Talk with your health care provider about what schedule of regular examinations is right for you. This is a good chance for you to check in with your provider about disease prevention and staying healthy. In between checkups, there are plenty of things you can do on your own. Experts have done a lot of research about which lifestyle changes and preventive measures are most likely to keep you healthy. Ask your health care provider for more information. Weight and diet Eat a healthy diet  Be sure to include plenty of vegetables, fruits, low-fat dairy products, and lean protein.  Do not eat a lot of foods high in solid fats, added sugars, or salt.  Get regular exercise. This is one of the most important things you can do for your health. ? Most adults should exercise for at least 150 minutes each week. The exercise should increase your heart rate and make you sweat (moderate-intensity exercise). ? Most adults should also do strengthening exercises at least twice a week. This is in addition to the moderate-intensity exercise.  Maintain a healthy weight  Body mass index (BMI) is a measurement that can be used to identify possible weight problems. It estimates body fat based on height and weight. Your health care provider can help determine your BMI and help you achieve or maintain a healthy weight.  For females 30 years of age and older: ? A BMI below 18.5 is considered underweight. ? A BMI of 18.5 to 24.9 is normal. ? A BMI of 25 to 29.9 is considered overweight. ? A BMI of 30 and above is considered obese.  Watch  levels of cholesterol and blood lipids  You should start having your blood tested for lipids and cholesterol at 82 years of age, then have this test every 5 years.  You may need to have your cholesterol levels checked more often if: ? Your lipid or cholesterol levels are high. ? You are older than 82 years of age. ? You are at high risk for heart disease.  Cancer screening Lung Cancer  Lung cancer screening is recommended for adults 42-70 years old who are at high risk for lung cancer because of a history of smoking.  A yearly low-dose CT scan of the lungs is recommended for people who: ? Currently smoke. ? Have quit within the past 15 years. ? Have at least a 30-pack-year history of smoking. A pack year is smoking an average of one pack of cigarettes a day for 1 year.  Yearly screening should continue until it has been 15 years since you quit.  Yearly screening should stop if you develop a health problem that would prevent you from having lung cancer treatment.  Breast Cancer  Practice breast self-awareness. This means understanding how your breasts normally appear and feel.  It also means doing regular breast self-exams. Let your health care provider know about any changes, no matter how small.  If you are in your 20s or 30s, you should have a clinical breast exam (CBE) by a health care provider every 1-3 years as part of a regular  health exam.  If you are 40 or older, have a CBE every year. Also consider having a breast X-ray (mammogram) every year.  If you have a family history of breast cancer, talk to your health care provider about genetic screening.  If you are at high risk for breast cancer, talk to your health care provider about having an MRI and a mammogram every year.  Breast cancer gene (BRCA) assessment is recommended for women who have family members with BRCA-related cancers. BRCA-related cancers include: ? Breast. ? Ovarian. ? Tubal. ? Peritoneal  cancers.  Results of the assessment will determine the need for genetic counseling and BRCA1 and BRCA2 testing.  Cervical Cancer Your health care provider may recommend that you be screened regularly for cancer of the pelvic organs (ovaries, uterus, and vagina). This screening involves a pelvic examination, including checking for microscopic changes to the surface of your cervix (Pap test). You may be encouraged to have this screening done every 3 years, beginning at age 41.  For women ages 56-65, health care providers may recommend pelvic exams and Pap testing every 3 years, or they may recommend the Pap and pelvic exam, combined with testing for human papilloma virus (HPV), every 5 years. Some types of HPV increase your risk of cervical cancer. Testing for HPV may also be done on women of any age with unclear Pap test results.  Other health care providers may not recommend any screening for nonpregnant women who are considered low risk for pelvic cancer and who do not have symptoms. Ask your health care provider if a screening pelvic exam is right for you.  If you have had past treatment for cervical cancer or a condition that could lead to cancer, you need Pap tests and screening for cancer for at least 20 years after your treatment. If Pap tests have been discontinued, your risk factors (such as having a new sexual partner) need to be reassessed to determine if screening should resume. Some women have medical problems that increase the chance of getting cervical cancer. In these cases, your health care provider may recommend more frequent screening and Pap tests.  Colorectal Cancer  This type of cancer can be detected and often prevented.  Routine colorectal cancer screening usually begins at 82 years of age and continues through 82 years of age.  Your health care provider may recommend screening at an earlier age if you have risk factors for colon cancer.  Your health care provider may also  recommend using home test kits to check for hidden blood in the stool.  A small camera at the end of a tube can be used to examine your colon directly (sigmoidoscopy or colonoscopy). This is done to check for the earliest forms of colorectal cancer.  Routine screening usually begins at age 71.  Direct examination of the colon should be repeated every 5-10 years through 82 years of age. However, you may need to be screened more often if early forms of precancerous polyps or small growths are found.  Skin Cancer  Check your skin from head to toe regularly.  Tell your health care provider about any new moles or changes in moles, especially if there is a change in a mole's shape or color.  Also tell your health care provider if you have a mole that is larger than the size of a pencil eraser.  Always use sunscreen. Apply sunscreen liberally and repeatedly throughout the day.  Protect yourself by wearing long sleeves, pants, a  wide-brimmed hat, and sunglasses whenever you are outside.  Heart disease, diabetes, and high blood pressure  High blood pressure causes heart disease and increases the risk of stroke. High blood pressure is more likely to develop in: ? People who have blood pressure in the high end of the normal range (130-139/85-89 mm Hg). ? People who are overweight or obese. ? People who are African American.  If you are 8-4 years of age, have your blood pressure checked every 3-5 years. If you are 52 years of age or older, have your blood pressure checked every year. You should have your blood pressure measured twice-once when you are at a hospital or clinic, and once when you are not at a hospital or clinic. Record the average of the two measurements. To check your blood pressure when you are not at a hospital or clinic, you can use: ? An automated blood pressure machine at a pharmacy. ? A home blood pressure monitor.  If you are between 41 years and 65 years old, ask your  health care provider if you should take aspirin to prevent strokes.  Have regular diabetes screenings. This involves taking a blood sample to check your fasting blood sugar level. ? If you are at a normal weight and have a low risk for diabetes, have this test once every three years after 82 years of age. ? If you are overweight and have a high risk for diabetes, consider being tested at a younger age or more often. Preventing infection Hepatitis B  If you have a higher risk for hepatitis B, you should be screened for this virus. You are considered at high risk for hepatitis B if: ? You were born in a country where hepatitis B is common. Ask your health care provider which countries are considered high risk. ? Your parents were born in a high-risk country, and you have not been immunized against hepatitis B (hepatitis B vaccine). ? You have HIV or AIDS. ? You use needles to inject street drugs. ? You live with someone who has hepatitis B. ? You have had sex with someone who has hepatitis B. ? You get hemodialysis treatment. ? You take certain medicines for conditions, including cancer, organ transplantation, and autoimmune conditions.  Hepatitis C  Blood testing is recommended for: ? Everyone born from [birth year] through 1963. ? Anyone with known risk factors for hepatitis C.  Sexually transmitted infections (STIs)  You should be screened for sexually transmitted infections (STIs) including gonorrhea and chlamydia if: ? You are sexually active and are younger than 82 years of age. ? You are older than 82 years of age and your health care provider tells you that you are at risk for this type of infection. ? Your sexual activity has changed since you were last screened and you are at an increased risk for chlamydia or gonorrhea. Ask your health care provider if you are at risk.  If you do not have HIV, but are at risk, it may be recommended that you take a prescription medicine daily to  prevent HIV infection. This is called pre-exposure prophylaxis (PrEP). You are considered at risk if: ? You are sexually active and do not regularly use condoms or know the HIV status of your partner(s). ? You take drugs by injection. ? You are sexually active with a partner who has HIV.  Talk with your health care provider about whether you are at high risk of being infected with HIV. If you choose to  begin PrEP, you should first be tested for HIV. You should then be tested every 3 months for as long as you are taking PrEP. Pregnancy  If you are premenopausal and you may become pregnant, ask your health care provider about preconception counseling.  If you may become pregnant, take 400 to 800 micrograms (mcg) of folic acid every day.  If you want to prevent pregnancy, talk to your health care provider about birth control (contraception). Osteoporosis and menopause  Osteoporosis is a disease in which the bones lose minerals and strength with aging. This can result in serious bone fractures. Your risk for osteoporosis can be identified using a bone density scan.  If you are 76 years of age or older, or if you are at risk for osteoporosis and fractures, ask your health care provider if you should be screened.  Ask your health care provider whether you should take a calcium or vitamin D supplement to lower your risk for osteoporosis.  Menopause may have certain physical symptoms and risks.  Hormone replacement therapy may reduce some of these symptoms and risks. Talk to your health care provider about whether hormone replacement therapy is right for you. Follow these instructions at home:  Schedule regular health, dental, and eye exams.  Stay current with your immunizations.  Do not use any tobacco products including cigarettes, chewing tobacco, or electronic cigarettes.  If you are pregnant, do not drink alcohol.  If you are breastfeeding, limit how much and how often you drink  alcohol.  Limit alcohol intake to no more than 1 drink per day for nonpregnant women. One drink equals 12 ounces of beer, 5 ounces of wine, or 1 ounces of hard liquor.  Do not use street drugs.  Do not share needles.  Ask your health care provider for help if you need support or information about quitting drugs.  Tell your health care provider if you often feel depressed.  Tell your health care provider if you have ever been abused or do not feel safe at home. This information is not intended to replace advice given to you by your health care provider. Make sure you discuss any questions you have with your health care provider. Document Released: 06/19/2011 Document Revised: 05/11/2016 Document Reviewed: 09/07/2015 Elsevier Interactive Patient Education  [employer].

## 2018-01-25 DIAGNOSIS — M1990 Unspecified osteoarthritis, unspecified site: Secondary | ICD-10-CM | POA: Insufficient documentation

## 2018-01-25 DIAGNOSIS — Z0000 Encounter for general adult medical examination without abnormal findings: Secondary | ICD-10-CM | POA: Insufficient documentation

## 2018-01-25 NOTE — Assessment & Plan Note (Signed)
Checking lipid panel but currently controlled on diet and exercise.

## 2018-01-25 NOTE — Assessment & Plan Note (Signed)
Flu and tetanus and pneumonia up to date. Aged out of colonoscopy and mammogram. Bone density test ordered. Counseled about shingrix vaccine. Counseled about sun safety and mole surveillance. Given 10 year screening recommendations.

## 2018-01-25 NOTE — Assessment & Plan Note (Signed)
She would like to change from atenolol to hctz for some diuresis for her venous insufficiency. Checking CMP and adjust as needed.

## 2018-01-25 NOTE — Assessment & Plan Note (Signed)
Rx for short supply hydrocodone for joint pain which she uses rarely. [place] narcotic database reviewed and no inappropriate fills. Advised to use tylenol first for pain or heat or otc creams as well.

## 2018-01-27 DIAGNOSIS — E2839 Other primary ovarian failure: Secondary | ICD-10-CM | POA: Diagnosis not present

## 2018-02-06 ENCOUNTER — Telehealth: Payer: Self-pay | Admitting: Internal Medicine

## 2018-02-06 DIAGNOSIS — I10 Essential (primary) hypertension: Secondary | ICD-10-CM

## 2018-02-06 NOTE — Telephone Encounter (Signed)
Copied from CRM [id number]. Topic: Quick Communication - See Telephone Encounter >> Feb 06, 2018  4:17 PM Guinevere FerrariMorris, Collin Hendley E, NT wrote: CRM for notification. See Telephone encounter for:  Patient daughter called and said that doctor gave her mother a new blood pressure medication but couldn't remember the name. Daughter said medication has actually raised her blood pressure. Pt has stopped taking the new medication and started back taking the old blood pressure medication itenol.  Daughter would like a call back.  02/06/18.

## 2018-02-07 MED ORDER — ATENOLOL 25 MG PO TABS: 25.0000 mg | ORAL_TABLET | Freq: Two times a day (BID) | ORAL | 3 refills | Status: DC

## 2018-02-07 NOTE — Telephone Encounter (Signed)
called daughter back regarding medication for mother. BP readings were around 174/97 and patient woke up with heart palpitations. Patient stopped taking the hydrochlorothiazide and started back atenolol that she had left and readings went down to 140/69 then to 134/68. Patient wants to be back on atenolol needs a new script [facility] [address]. Daughter is wondering if the hydrochlorothiazide could cause heart palpitations? She seemed worried since mother has never had that happened before

## 2018-02-07 NOTE — Telephone Encounter (Signed)
daughter informed of MD response

## 2018-02-07 NOTE — Telephone Encounter (Signed)
HCTZ should not cause heart palpitations but the atenolol does slow down the heart so stopping it can cause heart to speed up from prior. Likely to normal levels. Sent in atenolol to resume.

## 2018-04-01 ENCOUNTER — Telehealth: Payer: Self-pay | Admitting: Internal Medicine

## 2018-04-01 NOTE — Telephone Encounter (Signed)
Copied from CRM [id number]. Topic: Quick Communication - Rx Refill/Question >> Apr 01, 2018  4:42 PM Raquel SarnaHayes, Teresa G wrote: HYDROcodone-acetaminophen (NORCO/VICODIN) 5-325 MG tablet  Pt needing refill.  [id number] [place], [place] - [phone] AT [facility] [facility] [facility] [address] [place] [address] Phone: [phone] Fax: [phone] Not a 24 hour pharmacy; exact hours not known

## 2018-04-02 ENCOUNTER — Other Ambulatory Visit: Payer: Self-pay | Admitting: Internal Medicine

## 2018-04-02 NOTE — Telephone Encounter (Signed)
Refill of Norco  LOV 01/23/18  Dr. Okey Duprerawford  [facility] [place] [facility] [address]

## 2018-04-02 NOTE — Telephone Encounter (Signed)
Patient calling back to check on refill on Hydrocodone-acetaminophen she states that she is going out of town tomorrow

## 2018-04-02 NOTE — Telephone Encounter (Signed)
She is not on chronic medication. This was a one time prescription.

## 2018-04-02 NOTE — Telephone Encounter (Signed)
Hydrocodone-acetaminophen refill Last OV: 01/23/18 Last Refill:01/23/18 #40 tabs no RF [facility]acy:[facility] [address] PCP: Dr Okey Duprerawford

## 2018-04-02 NOTE — Telephone Encounter (Signed)
Copied from CRM [id number]. Topic: Quick Communication - Rx Refill/Question >> Apr 02, 2018  2:37 PM Rudi Coco, [place] wrote: Medication:HYDROcodone-acetaminophen (NORCO/VICODIN) 5-325 MG tablet [[id number] Has the patient contacted their pharmacy?yes (Agent: If no, request that the patient contact the pharmacy for the refill.) Preferred Pharmacy (with phone number or street name):[facility] [place], [place] - [address] AT [facility] & [address] [place] [address] Phone: [phone] Fax: [phone]  Rx. [facility] [address] [facility] [address] [facility].   Agent: Please be advised that RX refills may take up to 3 business days. We ask that you follow-up with your pharmacy.

## 2018-04-03 NOTE — Telephone Encounter (Signed)
LVM for patient with message below.

## 2018-04-03 NOTE — Telephone Encounter (Signed)
Duplicate request MD denied see previous request per MD was not meant for chronic.Marland Kitchen.Raechel Chute/lmb

## 2018-04-03 NOTE — Telephone Encounter (Signed)
Pls contact to make f/u appt if she is needing refill. See MD response below.Marland Kitchen.Raechel Chute/lmb

## 2018-04-15 ENCOUNTER — Telehealth: Payer: Self-pay | Admitting: Internal Medicine

## 2018-04-15 DIAGNOSIS — I10 Essential (primary) hypertension: Secondary | ICD-10-CM

## 2018-04-15 MED ORDER — ATENOLOL 25 MG PO TABS: 25.0000 mg | ORAL_TABLET | Freq: Two times a day (BID) | ORAL | 3 refills | Status: DC

## 2018-04-15 NOTE — Telephone Encounter (Signed)
Request for refill on Atenolol 25 mg tablet; last refilled on 02/07/18; # 180; RF x 3 Last office visit 01/23/18 PCP: Dr. Okey Dupre [facility]acy[facility] [address]   [employer].  Was advised their computer system had changed and they have lost her medication information.  Will resend the Rx for Atenolol.  Attempted to call pt's daughter.  Left voice message that another order will be sent to [facility] for Atenolol.  Instructed to call back if questions.

## 2018-04-15 NOTE — Telephone Encounter (Signed)
Copied from CRM [id number]. Topic: Quick Communication - Rx Refill/Question >> Apr 15, 2018 12:59 PM Alexander Bergeron B wrote: Medication: atenolol (TENORMIN) 25 MG tablet [[id number]]  Has the patient contacted their pharmacy? Yes.   (Agent: If no, request that the patient contact the pharmacy for the refill.) Preferred Pharmacy (with phone number [facility] street [facility] [address] Agent: Please be advised that RX refills may take up to 3 business days. We ask that you follow-up with your pharmacy.

## 2018-05-10 DIAGNOSIS — M1711 Unilateral primary osteoarthritis, right knee: Secondary | ICD-10-CM | POA: Diagnosis not present

## 2018-08-12 ENCOUNTER — Other Ambulatory Visit: Payer: Self-pay | Admitting: Internal Medicine

## 2018-08-28 ENCOUNTER — Other Ambulatory Visit: Payer: Self-pay | Admitting: Internal Medicine

## 2018-10-09 ENCOUNTER — Ambulatory Visit: Payer: Medicare Other

## 2018-10-17 ENCOUNTER — Ambulatory Visit (INDEPENDENT_AMBULATORY_CARE_PROVIDER_SITE_OTHER): Payer: Medicare Other

## 2018-10-17 DIAGNOSIS — Z23 Encounter for immunization: Secondary | ICD-10-CM | POA: Diagnosis not present

## 2018-11-29 DIAGNOSIS — S62337A Displaced fracture of neck of fifth metacarpal bone, left hand, initial encounter for closed fracture: Secondary | ICD-10-CM | POA: Diagnosis not present

## 2018-11-29 DIAGNOSIS — M79642 Pain in left hand: Secondary | ICD-10-CM | POA: Diagnosis not present

## 2018-12-20 DIAGNOSIS — S62337D Displaced fracture of neck of fifth metacarpal bone, left hand, subsequent encounter for fracture with routine healing: Secondary | ICD-10-CM | POA: Diagnosis not present

## 2019-03-03 ENCOUNTER — Ambulatory Visit: Payer: Self-pay | Admitting: Internal Medicine

## 2019-03-20 ENCOUNTER — Telehealth: Payer: Self-pay | Admitting: *Deleted

## 2019-03-20 NOTE — Telephone Encounter (Signed)
Called Patient and LVM stating that it is time to schedule both her yearly physical and AWV. Discussed there is an option to do a virtual AWV. Nurse requested that patient call-back and nurse will try to call again at a later date.

## 2019-04-11 ENCOUNTER — Other Ambulatory Visit: Payer: Self-pay | Admitting: Internal Medicine

## 2019-04-11 ENCOUNTER — Telehealth: Payer: Self-pay | Admitting: Internal Medicine

## 2019-04-11 DIAGNOSIS — I10 Essential (primary) hypertension: Secondary | ICD-10-CM

## 2019-04-11 NOTE — Telephone Encounter (Signed)
Copied from CRM [id number]. Topic: General - Other >> Apr 11, 2019 10:54 AM Leafy Ro wrote: Reason for CRM: Pt is calling and needs a refill on hydrocodone. [facility] [address]

## 2019-04-11 NOTE — Telephone Encounter (Signed)
Spoke to patient and she will have her daughter call back next week to set up a date and time because her daughter will have to help with the virtual, the patient does not have a smartphone or computer.

## 2019-04-11 NOTE — Telephone Encounter (Signed)
Patient will need to set up a virtual visit with Dr. Okey Dupre in order to get medication. She has not been seen since February of 2019

## 2019-07-19 ENCOUNTER — Other Ambulatory Visit: Payer: Self-pay | Admitting: Internal Medicine

## 2019-07-19 DIAGNOSIS — I10 Essential (primary) hypertension: Secondary | ICD-10-CM

## 2019-07-21 DIAGNOSIS — M545 Low back pain: Secondary | ICD-10-CM | POA: Diagnosis not present

## 2019-08-04 DIAGNOSIS — M4856XD Collapsed vertebra, not elsewhere classified, lumbar region, subsequent encounter for fracture with routine healing: Secondary | ICD-10-CM | POA: Diagnosis not present

## 2019-09-05 ENCOUNTER — Other Ambulatory Visit: Payer: Self-pay

## 2019-09-05 ENCOUNTER — Other Ambulatory Visit (INDEPENDENT_AMBULATORY_CARE_PROVIDER_SITE_OTHER): Payer: Medicare Other

## 2019-09-05 ENCOUNTER — Encounter: Payer: Self-pay | Admitting: Internal Medicine

## 2019-09-05 ENCOUNTER — Ambulatory Visit (INDEPENDENT_AMBULATORY_CARE_PROVIDER_SITE_OTHER): Payer: Medicare Other | Admitting: Internal Medicine

## 2019-09-05 VITALS — BP 138/80 | HR 56 | Temp 97.5°F | Ht 63.0 in | Wt 101.0 lb

## 2019-09-05 DIAGNOSIS — Z23 Encounter for immunization: Secondary | ICD-10-CM

## 2019-09-05 DIAGNOSIS — I10 Essential (primary) hypertension: Secondary | ICD-10-CM

## 2019-09-05 DIAGNOSIS — Z0000 Encounter for general adult medical examination without abnormal findings: Secondary | ICD-10-CM | POA: Diagnosis not present

## 2019-09-05 DIAGNOSIS — M150 Primary generalized (osteo)arthritis: Secondary | ICD-10-CM

## 2019-09-05 DIAGNOSIS — H6123 Impacted cerumen, bilateral: Secondary | ICD-10-CM

## 2019-09-05 DIAGNOSIS — E785 Hyperlipidemia, unspecified: Secondary | ICD-10-CM | POA: Diagnosis not present

## 2019-09-05 DIAGNOSIS — M159 Polyosteoarthritis, unspecified: Secondary | ICD-10-CM

## 2019-09-05 DIAGNOSIS — M8949 Other hypertrophic osteoarthropathy, multiple sites: Secondary | ICD-10-CM

## 2019-09-05 LAB — COMPREHENSIVE METABOLIC PANEL
ALT: 15 U/L (ref 0–35)
AST: 24 U/L (ref 0–37)
Albumin: 3.7 g/dL (ref 3.5–5.2)
Alkaline Phosphatase: 81 U/L (ref 39–117)
BUN: 27 mg/dL — ABNORMAL HIGH (ref 6–23)
CO2: 28 mEq/L (ref 19–32)
Calcium: 9.5 mg/dL (ref 8.4–10.5)
Chloride: 100 mEq/L (ref 96–112)
Creatinine, Ser: 1.12 mg/dL (ref 0.40–1.20)
GFR: 45.99 mL/min — ABNORMAL LOW (ref >60.00)
Glucose, Bld: 86 mg/dL (ref 70–99)
Potassium: 4.3 mEq/L (ref 3.5–5.1)
Sodium: 135 mEq/L (ref 135–145)
Total Bilirubin: 0.7 mg/dL (ref 0.2–1.2)
Total Protein: 6.4 g/dL (ref 6.0–8.3)

## 2019-09-05 LAB — HEMOGLOBIN A1C: Hgb A1c MFr Bld: 5.5 % (ref 4.6–6.5)

## 2019-09-05 LAB — LIPID PANEL
Cholesterol: 234 mg/dL — ABNORMAL HIGH (ref 0–200)
HDL: 69 mg/dL (ref >39.00)
LDL Cholesterol: 149 mg/dL — ABNORMAL HIGH (ref 0–99)
NonHDL: 164.7
Total CHOL/HDL Ratio: 3
Triglycerides: 79 mg/dL (ref 0.0–149.0)
VLDL: 15.8 mg/dL (ref 0.0–40.0)

## 2019-09-05 LAB — CBC
HCT: 31.5 % — ABNORMAL LOW (ref 36.0–46.0)
Hemoglobin: 10.9 g/dL — ABNORMAL LOW (ref 12.0–15.0)
MCHC: 34.5 g/dL (ref 30.0–36.0)
MCV: 94.8 fl (ref 78.0–100.0)
Platelets: 195 10*3/uL (ref 150.0–400.0)
RBC: 3.32 Mil/uL — ABNORMAL LOW (ref 3.87–5.11)
RDW: 13.3 % (ref 11.5–15.5)
WBC: 4.3 10*3/uL (ref 4.0–10.5)

## 2019-09-05 NOTE — Assessment & Plan Note (Signed)
Flu shot given. Pneumonia complete. Shingrix counseled. Tetanus due 2022. Colonoscopy aged out. Mammogram aged out, pap smear aged out and dexa due 2021. Counseled about sun safety and mole surveillance. Counseled about the dangers of distracted driving. Given 10 year screening recommendations.

## 2019-09-05 NOTE — Assessment & Plan Note (Signed)
Ear lavage done during visit with good return of hearing. Tolerated well without complications.

## 2019-09-05 NOTE — Assessment & Plan Note (Signed)
Checking lipid panel, not on meds.  

## 2019-09-05 NOTE — Assessment & Plan Note (Signed)
Checking CMP, taking atenolol 25 mg BID. Adjust as needed. HR okay and BP at goal.

## 2019-09-05 NOTE — Progress Notes (Signed)
Patient consent obtained. Irrigation with water and peroxide performed. Full view of tympanic membranes after procedure.  Patient tolerated procedure well.   

## 2019-09-05 NOTE — Assessment & Plan Note (Signed)
Worse due to compression fracture since last visit. Taking hydrocodone from orthopedics.

## 2019-09-05 NOTE — Progress Notes (Deleted)
New Patient Office Visit  Subjective:  Patient ID: Charlene Garza, female    DOB: [birth year]  Age: 85 y.o. Charlene NickelsMRN: [id number]  CC:  Chief Complaint  Patient presents with  . Follow-up    1 year. would like ears lookes at there is lots of wax     HPI Charlene Nickelsatricia Brassell presents for ***  Past Medical History:  Diagnosis Date  . Anemia   . Arthritis   . Hyperlipidemia   . Hypertension   . Osteopenia    T score : - 2.4 @ R femoral neck  . Renal insufficiency     Past Surgical History:  Procedure Laterality Date  . CATARACT EXTRACTION     bilaterally  . CHOLECYSTECTOMY  10/17/2012   Procedure: LAPAROSCOPIC CHOLECYSTECTOMY WITH INTRAOPERATIVE CHOLANGIOGRAM;  Surgeon: Axel FillerArmando Ramirez, MD;  Location: [facility];  Service: General;  Laterality: N/A;  . KNEE SURGERY  10/2003   L TKR    Family History  Problem Relation Age of Onset  . Hypertension Father   . Hypertension Mother   . Heart attack Mother [id number]  . Hypertension Brother   . Diabetes Neg Hx   . Stroke Neg Hx     Social History   Socioeconomic History  . Marital status: Married    Spouse name: Not on file  . Number of children: Not on file  . Years of education: Not on file  . Highest education level: Not on file  Occupational History  . Not on file  Social Needs  . Financial resource strain: Not on file  . Food insecurity    Worry: Not on file    Inability: Not on file  . Transportation needs    Medical: Not on file    Non-medical: Not on file  Tobacco Use  . Smoking status: Never Smoker  . Smokeless tobacco: Never Used  Substance and Sexual Activity  . Alcohol use: Yes    Alcohol/week: 7.0 standard drinks    Types: 7 Shots of liquor per week  . Drug use: No  . Sexual activity: Not Currently  Lifestyle  . Physical activity    Days per week: Not on file    Minutes per session: Not on file  . Stress: Not on file  Relationships  . Social Musicianconnections    Talks on phone: Not on file    Gets together: Not  on file    Attends religious service: Not on file    Active member of club or organization: Not on file    Attends meetings of clubs or organizations: Not on file    Relationship status: Not on file  . Intimate partner violence    Fear of current or ex partner: Not on file    Emotionally abused: Not on file    Physically abused: Not on file    Forced sexual activity: Not on file  Other Topics Concern  . Not on file  Social History Narrative  . Not on file    ROS Review of Systems  Objective:   Today's Vitals: BP 138/80 (BP Location: Left Arm, Patient Position: Sitting, Cuff Size: Normal)   Pulse (!) 56   Temp (!) 97.5 F (36.4 C) (Oral)   Ht 5\' 3"  (1.6 m)   Wt 101 lb (45.8 kg)   SpO2 99%   BMI 17.89 kg/m   Physical Exam  Assessment & Plan:   Problem List Items Addressed This Visit      Other  Routine general medical examination at a health care facility   Relevant Orders   Lipid panel   Comprehensive metabolic panel   CBC   Hemoglobin A1c    Other Visit Diagnoses    Need for influenza vaccination    -  Primary   Relevant Orders   Flu Vaccine QUAD High Dose(Fluad) (Completed)      Outpatient Encounter Medications as of 09/05/2019  Medication Sig  . atenolol (TENORMIN) 25 MG tablet Take 1 tablet (25 mg total) by mouth 2 (two) times daily. Need office visit before next refill  . HYDROcodone-acetaminophen (NORCO/VICODIN) 5-325 MG tablet Take 1-2 tablets by mouth every 6 (six) hours as needed for moderate pain.   No facility-administered encounter medications on file as of 09/05/2019.     Follow-up: Return in about 1 year (around 09/04/2020).   Pollyann Glen, CMA

## 2019-09-05 NOTE — Progress Notes (Signed)
Subjective:   Patient ID: Charlene Garza, female    DOB: Jul 16, 1930, 85 y.o.   MRN: [id number]  HPI Here for medicare wellness and physical, no new complaints. Please see A/P for status and treatment of chronic medical problems.   Diet: heart healthy Physical activity: sedentary Depression/mood screen: negative Hearing: intact to whispered voice Visual acuity: grossly normal, performs annual eye exam  ADLs: capable Fall risk: high, has walker and wheelchair for traveling, has call button Home safety: good Cognitive evaluation: intact to orientation, naming, recall and repetition EOL planning: adv directives discussed    Office Visit from 09/05/2019 in [place]  PHQ-2 Total Score  0      I have personally reviewed and have noted 1. The patient's medical and social history - reviewed today no changes 2. Their use of alcohol, tobacco or illicit drugs 3. Their current medications and supplements 4. The patient's functional ability including ADL's, fall risks, home safety risks and hearing or visual impairment. 5. Diet and physical activities 6. Evidence for depression or mood disorders 7. Care team reviewed and updated  Patient Care Team: Hoyt Koch, MD as PCP - General (Internal Medicine) Edison Pace, Md, MD as Consulting Physician (General Surgery) Past Medical History:  Diagnosis Date  . Anemia   . Arthritis   . Hyperlipidemia   . Hypertension   . Osteopenia    T score : - 2.4 @ R femoral neck  . Renal insufficiency    Past Surgical History:  Procedure Laterality Date  . CATARACT EXTRACTION     bilaterally  . CHOLECYSTECTOMY  10/17/2012   Procedure: LAPAROSCOPIC CHOLECYSTECTOMY WITH INTRAOPERATIVE CHOLANGIOGRAM;  Surgeon: Ralene Ok, MD;  Location: [facility];  Service: General;  Laterality: N/A;  . KNEE SURGERY  10/2003   L TKR   Family History  Problem Relation Age of Onset  . Hypertension Father   . Hypertension Mother   .  Heart attack Mother 31  . Hypertension Brother   . Diabetes Neg Hx   . Stroke Neg Hx    Review of Systems  Constitutional: Negative.   HENT: Positive for hearing loss.   Eyes: Negative.   Respiratory: Negative for cough, chest tightness and shortness of breath.   Cardiovascular: Negative for chest pain, palpitations and leg swelling.  Gastrointestinal: Negative for abdominal distention, abdominal pain, constipation, diarrhea, nausea and vomiting.  Musculoskeletal: Positive for arthralgias and gait problem.  Skin: Negative.   Neurological: Positive for weakness. Negative for dizziness, tremors, speech difficulty, light-headedness and headaches.  Psychiatric/Behavioral: Negative.     Objective:  Physical Exam Constitutional:      Appearance: She is well-developed.     Comments: thin  HENT:     Head: Normocephalic and atraumatic.     Comments:  both ears canal impacted with copious hard wax, examination post ear lavage canal is clear and no bleeding or complications noted.  Neck:     Musculoskeletal: Normal range of motion.  Cardiovascular:     Rate and Rhythm: Normal rate and regular rhythm.  Pulmonary:     Effort: Pulmonary effort is normal. No respiratory distress.     Breath sounds: Normal breath sounds. No wheezing or rales.  Abdominal:     General: Bowel sounds are normal. There is no distension.     Palpations: Abdomen is soft.     Tenderness: There is no abdominal tenderness. There is no rebound.  Musculoskeletal:        General: Tenderness  present.  Skin:    General: Skin is warm and dry.  Neurological:     Mental Status: She is alert and oriented to person, place, and time.     Coordination: Coordination abnormal.     Comments: Wheelchair today     Vitals:   09/05/19 1004  BP: 138/80  Pulse: (!) 56  Temp: (!) 97.5 F (36.4 C)  TempSrc: Oral  SpO2: 99%  Weight: 101 lb (45.8 kg)  Height: 5\' 3"  (1.6 m)    Assessment & Plan:  Flu shot given at visit

## 2019-09-05 NOTE — Patient Instructions (Signed)
Health Maintenance, Female Adopting a healthy lifestyle and getting preventive care are important in promoting health and wellness. Ask your health care provider about:  The right schedule for you to have regular tests and exams.  Things you can do on your own to prevent diseases and keep yourself healthy. What should I know about diet, weight, and exercise? Eat a healthy diet   Eat a diet that includes plenty of vegetables, fruits, low-fat dairy products, and lean protein.  Do not eat a lot of foods that are high in solid fats, added sugars, or sodium. Maintain a healthy weight Body mass index (BMI) is used to identify weight problems. It estimates body fat based on height and weight. Your health care provider can help determine your BMI and help you achieve or maintain a healthy weight. Get regular exercise Get regular exercise. This is one of the most important things you can do for your health. Most adults should:  Exercise for at least 150 minutes each week. The exercise should increase your heart rate and make you sweat (moderate-intensity exercise).  Do strengthening exercises at least twice a week. This is in addition to the moderate-intensity exercise.  Spend less time sitting. Even light physical activity can be beneficial. Watch cholesterol and blood lipids Have your blood tested for lipids and cholesterol at 83 years of age, then have this test every 5 years. Have your cholesterol levels checked more often if:  Your lipid or cholesterol levels are high.  You are older than 83 years of age.  You are at high risk for heart disease. What should I know about cancer screening? Depending on your health history and family history, you may need to have cancer screening at various ages. This may include screening for:  Breast cancer.  Cervical cancer.  Colorectal cancer.  Skin cancer.  Lung cancer. What should I know about heart disease, diabetes, and high blood  pressure? Blood pressure and heart disease  High blood pressure causes heart disease and increases the risk of stroke. This is more likely to develop in people who have high blood pressure readings, are of African descent, or are overweight.  Have your blood pressure checked: ? Every 3-5 years if you are 18-39 years of age. ? Every year if you are 40 years old or older. Diabetes Have regular diabetes screenings. This checks your fasting blood sugar level. Have the screening done:  Once every three years after age 40 if you are at a normal weight and have a low risk for diabetes.  More often and at a younger age if you are overweight or have a high risk for diabetes. What should I know about preventing infection? Hepatitis B If you have a higher risk for hepatitis B, you should be screened for this virus. Talk with your health care provider to find out if you are at risk for hepatitis B infection. Hepatitis C Testing is recommended for:  Everyone born from 1945 through 1965.  Anyone with known risk factors for hepatitis C. Sexually transmitted infections (STIs)  Get screened for STIs, including gonorrhea and chlamydia, if: ? You are sexually active and are younger than 83 years of age. ? You are older than 83 years of age and your health care provider tells you that you are at risk for this type of infection. ? Your sexual activity has changed since you were last screened, and you are at increased risk for chlamydia or gonorrhea. Ask your health care provider if   you are at risk.  Ask your health care provider about whether you are at high risk for HIV. Your health care provider may recommend a prescription medicine to help prevent HIV infection. If you choose to take medicine to prevent HIV, you should first get tested for HIV. You should then be tested every 3 months for as long as you are taking the medicine. Pregnancy  If you are about to stop having your period (premenopausal) and  you may become pregnant, seek counseling before you get pregnant.  Take 400 to 800 micrograms (mcg) of folic acid every day if you become pregnant.  Ask for birth control (contraception) if you want to prevent pregnancy. Osteoporosis and menopause Osteoporosis is a disease in which the bones lose minerals and strength with aging. This can result in bone fractures. If you are 65 years old or older, or if you are at risk for osteoporosis and fractures, ask your health care provider if you should:  Be screened for bone loss.  Take a calcium or vitamin D supplement to lower your risk of fractures.  Be given hormone replacement therapy (HRT) to treat symptoms of menopause. Follow these instructions at home: Lifestyle  Do not use any products that contain nicotine or tobacco, such as cigarettes, e-cigarettes, and chewing tobacco. If you need help quitting, ask your health care provider.  Do not use street drugs.  Do not share needles.  Ask your health care provider for help if you need support or information about quitting drugs. Alcohol use  Do not drink alcohol if: ? Your health care provider tells you not to drink. ? You are pregnant, may be pregnant, or are planning to become pregnant.  If you drink alcohol: ? Limit how much you use to 0-1 drink a day. ? Limit intake if you are breastfeeding.  Be aware of how much alcohol is in your drink. In the [place] one drink equals one 12 oz bottle of beer (355 mL), one 5 oz glass of wine (148 mL), or one 1 oz glass of hard liquor (44 mL). General instructions  Schedule regular health, dental, and eye exams.  Stay current with your vaccines.  Tell your health care provider if: ? You often feel depressed. ? You have ever been abused or do not feel safe at home. Summary  Adopting a healthy lifestyle and getting preventive care are important in promoting health and wellness.  Follow your health care provider's instructions about healthy  diet, exercising, and getting tested or screened for diseases.  Follow your health care provider's instructions on monitoring your cholesterol and blood pressure. This information is not intended to replace advice given to you by your health care provider. Make sure you discuss any questions you have with your health care provider. Document Released: 06/19/2011 Document Revised: 11/27/2018 Document Reviewed: 11/27/2018 Elsevier Patient Education  2020 [employer]  

## 2019-09-08 ENCOUNTER — Telehealth: Payer: Self-pay | Admitting: Internal Medicine

## 2019-09-08 ENCOUNTER — Other Ambulatory Visit: Payer: Self-pay | Admitting: Internal Medicine

## 2019-09-08 MED ORDER — DONEPEZIL HCL 10 MG PO TABS: 10.0000 mg | ORAL_TABLET | Freq: Every day | ORAL | 3 refills | Status: DC

## 2019-09-08 NOTE — Telephone Encounter (Signed)
Patient requesting medication for memory loss be sent to pharmacy

## 2019-09-08 NOTE — Telephone Encounter (Signed)
Sent in, forgot to send it in Friday.

## 2019-09-08 NOTE — Telephone Encounter (Signed)
Medication Refill - Medication: memory loss medication  Has the patient contacted their pharmacy? Yes.   Pts daughter called stating this was supposed to be sent in on 09/05/2019. Please advise.  (Agent: If no, request that the patient contact the pharmacy for the refill.) (Agent: If yes, when and what did the pharmacy advise?)  Preferred Pharmacy (with phone number or street name):  [id number] [place], [place]  [address] [place] [address]  Phone: [phone] Fax: [phone]  Not a 24 hour pharmacy; exact hours not known.     Agent: Please be advised that RX refills may take up to 3 business days. We ask that you follow-up with your pharmacy.

## 2019-09-09 ENCOUNTER — Other Ambulatory Visit: Payer: Self-pay

## 2019-09-09 DIAGNOSIS — I10 Essential (primary) hypertension: Secondary | ICD-10-CM

## 2019-09-09 MED ORDER — ATENOLOL 25 MG PO TABS: 25.0000 mg | ORAL_TABLET | Freq: Two times a day (BID) | ORAL | 3 refills | Status: DC

## 2019-11-20 ENCOUNTER — Ambulatory Visit: Payer: Medicare Other | Admitting: Internal Medicine

## 2019-11-21 ENCOUNTER — Encounter: Payer: Self-pay | Admitting: Internal Medicine

## 2019-11-21 ENCOUNTER — Ambulatory Visit: Payer: Medicare Other | Admitting: Internal Medicine

## 2019-11-21 DIAGNOSIS — I872 Venous insufficiency (chronic) (peripheral): Secondary | ICD-10-CM

## 2019-11-21 NOTE — Progress Notes (Signed)
Patient ID: Charlene Garza, female   DOB: 07-Oct-1930, 85 y.o.   MRN: [id number]  Spoke to daughter; video not working and she really wanted [place] to see her mothers feet, so declines change of video visit to phone only  Will ask office to call her to set up appt next wk with Dr Sharlet Salina

## 2019-11-21 NOTE — Patient Instructions (Signed)
none

## 2019-11-28 ENCOUNTER — Encounter: Payer: Self-pay | Admitting: Internal Medicine

## 2019-11-28 ENCOUNTER — Ambulatory Visit (INDEPENDENT_AMBULATORY_CARE_PROVIDER_SITE_OTHER): Payer: Medicare Other | Admitting: Internal Medicine

## 2019-11-28 DIAGNOSIS — R413 Other amnesia: Secondary | ICD-10-CM | POA: Diagnosis not present

## 2019-11-28 DIAGNOSIS — I872 Venous insufficiency (chronic) (peripheral): Secondary | ICD-10-CM | POA: Diagnosis not present

## 2019-11-28 MED ORDER — MEMANTINE HCL 5 MG PO TABS: 5.0000 mg | ORAL_TABLET | Freq: Every day | ORAL | 3 refills | Status: DC

## 2019-11-28 NOTE — Progress Notes (Signed)
Virtual Visit via Video Note  I connected with Arlys John on 11/28/19 at 11:00 AM EST by a video enabled telemedicine application and verified that I am speaking with the correct person using two identifiers.  The patient and the provider were at separate locations throughout the entire encounter.   I discussed the limitations of evaluation and management by telemedicine and the availability of in person appointments. The patient expressed understanding and agreed to proceed. The patient and the provider and patient's daughter were the only parties present for the visit unless noted in HPI below.   History of Present Illness: The patient is a 85 y.o. female with visit for swelling in feet (chronic and slightly worse than usual, with some discoloration on the legs which is new, denies pain, right worse than left due to old injury to the right foot, denies taking anything for this, denies worsening significant, only getting up once at night to urinate) and memory (tried aricept and it made her feel like she would pass out, did not take again, worsening short term memory especially, would like to try something else). Daughter provides most of the history. Prior compression stockings are not a good option as they do not fit well.  Observations/Objective: Appearance: normal, breathing appears normal, casual grooming, abdomen does not appear distended, throat normal, memory not examined, mental status is A and O times 3, foot and ankle swelling moderate right worse than left, with some stigmata of chronic venous insufficiency without cellulitis on both ankles  Assessment and Plan: See problem oriented charting  Follow Up Instructions: rx namenda, low sodium and increase fluids, prop legs up  Visit time 25 minutes: greater than 50% of that time was spent in face to face counseling and coordination of care with the patient: counseled about medical conditions listed above as well as counseled about  covid-19.   I discussed the assessment and treatment plan with the patient. The patient was provided an opportunity to ask questions and all were answered. The patient agreed with the plan and demonstrated an understanding of the instructions.   The patient was advised to call back or seek an in-person evaluation if the symptoms worsen or if the condition fails to improve as anticipated.  Hoyt Koch, MD

## 2019-11-28 NOTE — Assessment & Plan Note (Signed)
No signs of infection, reassurance given. No medication at this time. Advised low sodium, increased fluids, prop legs up.

## 2019-11-28 NOTE — Assessment & Plan Note (Signed)
Side effect with aricept, rx namenda instead low dose.

## 2020-01-15 ENCOUNTER — Ambulatory Visit: Payer: Medicare Other

## 2020-01-23 ENCOUNTER — Ambulatory Visit: Payer: Medicare Other | Attending: Internal Medicine

## 2020-01-26 ENCOUNTER — Other Ambulatory Visit: Payer: Self-pay

## 2020-01-26 ENCOUNTER — Emergency Department (HOSPITAL_COMMUNITY): Payer: Medicare Other

## 2020-01-26 ENCOUNTER — Inpatient Hospital Stay (HOSPITAL_COMMUNITY)
Admission: EM | Admit: 2020-01-26 | Discharge: 2020-02-01 | DRG: 481 | Disposition: A | Discharge status: Skilled Nursing Facility | Payer: Medicare Other | Attending: Internal Medicine | Admitting: Internal Medicine

## 2020-01-26 DIAGNOSIS — Z79899 Other long term (current) drug therapy: Secondary | ICD-10-CM

## 2020-01-26 DIAGNOSIS — S99922A Unspecified injury of left foot, initial encounter: Secondary | ICD-10-CM | POA: Diagnosis not present

## 2020-01-26 DIAGNOSIS — D649 Anemia, unspecified: Secondary | ICD-10-CM | POA: Diagnosis not present

## 2020-01-26 DIAGNOSIS — M8580 Other specified disorders of bone density and structure, unspecified site: Secondary | ICD-10-CM | POA: Diagnosis present

## 2020-01-26 DIAGNOSIS — H6123 Impacted cerumen, bilateral: Secondary | ICD-10-CM | POA: Diagnosis present

## 2020-01-26 DIAGNOSIS — Z03818 Encounter for observation for suspected exposure to other biological agents ruled out: Secondary | ICD-10-CM | POA: Diagnosis not present

## 2020-01-26 DIAGNOSIS — F0390 Unspecified dementia without behavioral disturbance: Secondary | ICD-10-CM | POA: Diagnosis present

## 2020-01-26 DIAGNOSIS — E871 Hypo-osmolality and hyponatremia: Secondary | ICD-10-CM | POA: Diagnosis present

## 2020-01-26 DIAGNOSIS — M25551 Pain in right hip: Secondary | ICD-10-CM | POA: Diagnosis not present

## 2020-01-26 DIAGNOSIS — D696 Thrombocytopenia, unspecified: Secondary | ICD-10-CM | POA: Diagnosis not present

## 2020-01-26 DIAGNOSIS — I129 Hypertensive chronic kidney disease with stage 1 through stage 4 chronic kidney disease, or unspecified chronic kidney disease: Secondary | ICD-10-CM | POA: Diagnosis present

## 2020-01-26 DIAGNOSIS — M47812 Spondylosis without myelopathy or radiculopathy, cervical region: Secondary | ICD-10-CM | POA: Diagnosis not present

## 2020-01-26 DIAGNOSIS — M5030 Other cervical disc degeneration, unspecified cervical region: Secondary | ICD-10-CM | POA: Diagnosis not present

## 2020-01-26 DIAGNOSIS — Z681 Body mass index (BMI) 19 or less, adult: Secondary | ICD-10-CM | POA: Diagnosis not present

## 2020-01-26 DIAGNOSIS — M6281 Muscle weakness (generalized): Secondary | ICD-10-CM | POA: Diagnosis not present

## 2020-01-26 DIAGNOSIS — R0902 Hypoxemia: Secondary | ICD-10-CM | POA: Diagnosis not present

## 2020-01-26 DIAGNOSIS — Z888 Allergy status to other drugs, medicaments and biological substances status: Secondary | ICD-10-CM | POA: Diagnosis not present

## 2020-01-26 DIAGNOSIS — W1830XA Fall on same level, unspecified, initial encounter: Secondary | ICD-10-CM | POA: Diagnosis present

## 2020-01-26 DIAGNOSIS — S72141A Displaced intertrochanteric fracture of right femur, initial encounter for closed fracture: Secondary | ICD-10-CM | POA: Diagnosis not present

## 2020-01-26 DIAGNOSIS — S72141D Displaced intertrochanteric fracture of right femur, subsequent encounter for closed fracture with routine healing: Secondary | ICD-10-CM | POA: Diagnosis not present

## 2020-01-26 DIAGNOSIS — I10 Essential (primary) hypertension: Secondary | ICD-10-CM | POA: Diagnosis present

## 2020-01-26 DIAGNOSIS — D62 Acute posthemorrhagic anemia: Secondary | ICD-10-CM | POA: Diagnosis not present

## 2020-01-26 DIAGNOSIS — I6523 Occlusion and stenosis of bilateral carotid arteries: Secondary | ICD-10-CM | POA: Diagnosis not present

## 2020-01-26 DIAGNOSIS — R2689 Other abnormalities of gait and mobility: Secondary | ICD-10-CM | POA: Diagnosis not present

## 2020-01-26 DIAGNOSIS — Z419 Encounter for procedure for purposes other than remedying health state, unspecified: Secondary | ICD-10-CM

## 2020-01-26 DIAGNOSIS — Z8249 Family history of ischemic heart disease and other diseases of the circulatory system: Secondary | ICD-10-CM

## 2020-01-26 DIAGNOSIS — Z9181 History of falling: Secondary | ICD-10-CM | POA: Diagnosis not present

## 2020-01-26 DIAGNOSIS — M2550 Pain in unspecified joint: Secondary | ICD-10-CM | POA: Diagnosis not present

## 2020-01-26 DIAGNOSIS — S72009A Fracture of unspecified part of neck of unspecified femur, initial encounter for closed fracture: Secondary | ICD-10-CM | POA: Diagnosis present

## 2020-01-26 DIAGNOSIS — E785 Hyperlipidemia, unspecified: Secondary | ICD-10-CM | POA: Diagnosis not present

## 2020-01-26 DIAGNOSIS — R410 Disorientation, unspecified: Secondary | ICD-10-CM | POA: Diagnosis not present

## 2020-01-26 DIAGNOSIS — Z96641 Presence of right artificial hip joint: Secondary | ICD-10-CM | POA: Diagnosis not present

## 2020-01-26 DIAGNOSIS — N183 Chronic kidney disease, stage 3 unspecified: Secondary | ICD-10-CM | POA: Diagnosis not present

## 2020-01-26 DIAGNOSIS — R9431 Abnormal electrocardiogram [ECG] [EKG]: Secondary | ICD-10-CM | POA: Diagnosis not present

## 2020-01-26 DIAGNOSIS — S72001A Fracture of unspecified part of neck of right femur, initial encounter for closed fracture: Secondary | ICD-10-CM | POA: Diagnosis not present

## 2020-01-26 DIAGNOSIS — Z20822 Contact with and (suspected) exposure to covid-19: Secondary | ICD-10-CM | POA: Diagnosis present

## 2020-01-26 DIAGNOSIS — Z7401 Bed confinement status: Secondary | ICD-10-CM | POA: Diagnosis not present

## 2020-01-26 DIAGNOSIS — M1990 Unspecified osteoarthritis, unspecified site: Secondary | ICD-10-CM | POA: Diagnosis not present

## 2020-01-26 DIAGNOSIS — R41841 Cognitive communication deficit: Secondary | ICD-10-CM | POA: Diagnosis not present

## 2020-01-26 DIAGNOSIS — R52 Pain, unspecified: Secondary | ICD-10-CM | POA: Diagnosis not present

## 2020-01-26 DIAGNOSIS — Z96652 Presence of left artificial knee joint: Secondary | ICD-10-CM | POA: Diagnosis not present

## 2020-01-26 DIAGNOSIS — S299XXA Unspecified injury of thorax, initial encounter: Secondary | ICD-10-CM | POA: Diagnosis not present

## 2020-01-26 DIAGNOSIS — R2681 Unsteadiness on feet: Secondary | ICD-10-CM | POA: Diagnosis not present

## 2020-01-26 DIAGNOSIS — S0990XA Unspecified injury of head, initial encounter: Secondary | ICD-10-CM | POA: Diagnosis not present

## 2020-01-26 MED ORDER — FENTANYL CITRATE (PF) 100 MCG/2ML IJ SOLN
50.0000 ug | INTRAMUSCULAR | Status: DC | PRN
  Administered 2020-01-27: 01:00:00 50 ug via INTRAVENOUS
  Filled 2020-01-26: qty 2

## 2020-01-26 NOTE — ED Triage Notes (Signed)
BIB [facility] from home. Lives alone daughter lives few housed down per EMS.  Ems reports mechnicall fall on to right hip with obvious shortening and rotation to right leg.  A/O x4.  No other complaints. Hypertensive with EMS  150 of fentanyl IV R hand.    210/120 HR 80 96% RA  RR 18

## 2020-01-26 NOTE — ED Notes (Signed)
Pt transported to xray 

## 2020-01-27 ENCOUNTER — Inpatient Hospital Stay (HOSPITAL_COMMUNITY): Payer: Medicare Other

## 2020-01-27 ENCOUNTER — Encounter (HOSPITAL_COMMUNITY)
Admission: EM | Disposition: A | Discharge status: Skilled Nursing Facility | Payer: Self-pay | Source: Home / Self Care | Attending: Internal Medicine

## 2020-01-27 ENCOUNTER — Encounter (HOSPITAL_COMMUNITY): Payer: Self-pay | Admitting: Internal Medicine

## 2020-01-27 ENCOUNTER — Inpatient Hospital Stay (HOSPITAL_COMMUNITY): Payer: Medicare Other | Admitting: Anesthesiology

## 2020-01-27 DIAGNOSIS — R9431 Abnormal electrocardiogram [ECG] [EKG]: Secondary | ICD-10-CM | POA: Diagnosis not present

## 2020-01-27 DIAGNOSIS — I129 Hypertensive chronic kidney disease with stage 1 through stage 4 chronic kidney disease, or unspecified chronic kidney disease: Secondary | ICD-10-CM | POA: Diagnosis not present

## 2020-01-27 DIAGNOSIS — Z888 Allergy status to other drugs, medicaments and biological substances status: Secondary | ICD-10-CM | POA: Diagnosis not present

## 2020-01-27 DIAGNOSIS — S72141A Displaced intertrochanteric fracture of right femur, initial encounter for closed fracture: Secondary | ICD-10-CM | POA: Diagnosis not present

## 2020-01-27 DIAGNOSIS — S72001A Fracture of unspecified part of neck of right femur, initial encounter for closed fracture: Secondary | ICD-10-CM

## 2020-01-27 DIAGNOSIS — Z8249 Family history of ischemic heart disease and other diseases of the circulatory system: Secondary | ICD-10-CM | POA: Diagnosis not present

## 2020-01-27 DIAGNOSIS — F0390 Unspecified dementia without behavioral disturbance: Secondary | ICD-10-CM

## 2020-01-27 DIAGNOSIS — N183 Chronic kidney disease, stage 3 unspecified: Secondary | ICD-10-CM | POA: Diagnosis not present

## 2020-01-27 DIAGNOSIS — S72009A Fracture of unspecified part of neck of unspecified femur, initial encounter for closed fracture: Secondary | ICD-10-CM | POA: Diagnosis present

## 2020-01-27 DIAGNOSIS — D62 Acute posthemorrhagic anemia: Secondary | ICD-10-CM | POA: Diagnosis not present

## 2020-01-27 DIAGNOSIS — H6123 Impacted cerumen, bilateral: Secondary | ICD-10-CM | POA: Diagnosis present

## 2020-01-27 DIAGNOSIS — W1830XA Fall on same level, unspecified, initial encounter: Secondary | ICD-10-CM | POA: Diagnosis present

## 2020-01-27 DIAGNOSIS — E785 Hyperlipidemia, unspecified: Secondary | ICD-10-CM | POA: Diagnosis not present

## 2020-01-27 DIAGNOSIS — D649 Anemia, unspecified: Secondary | ICD-10-CM | POA: Diagnosis present

## 2020-01-27 DIAGNOSIS — Z96652 Presence of left artificial knee joint: Secondary | ICD-10-CM | POA: Diagnosis present

## 2020-01-27 DIAGNOSIS — Z20822 Contact with and (suspected) exposure to covid-19: Secondary | ICD-10-CM | POA: Diagnosis present

## 2020-01-27 DIAGNOSIS — M8580 Other specified disorders of bone density and structure, unspecified site: Secondary | ICD-10-CM | POA: Diagnosis present

## 2020-01-27 DIAGNOSIS — Z79899 Other long term (current) drug therapy: Secondary | ICD-10-CM | POA: Diagnosis not present

## 2020-01-27 DIAGNOSIS — R0902 Hypoxemia: Secondary | ICD-10-CM | POA: Diagnosis not present

## 2020-01-27 DIAGNOSIS — Z681 Body mass index (BMI) 19 or less, adult: Secondary | ICD-10-CM | POA: Diagnosis not present

## 2020-01-27 DIAGNOSIS — D696 Thrombocytopenia, unspecified: Secondary | ICD-10-CM | POA: Diagnosis not present

## 2020-01-27 DIAGNOSIS — E871 Hypo-osmolality and hyponatremia: Secondary | ICD-10-CM | POA: Diagnosis present

## 2020-01-27 HISTORY — PX: FEMUR IM NAIL: SHX1597

## 2020-01-27 LAB — CBC WITH DIFFERENTIAL/PLATELET
Abs Immature Granulocytes: 0.05 10*3/uL (ref 0.00–0.07)
Basophils Absolute: 0 10*3/uL (ref 0.0–0.1)
Basophils Relative: 0 %
Eosinophils Absolute: 0.1 10*3/uL (ref 0.0–0.5)
Eosinophils Relative: 1 %
HCT: 33 % — ABNORMAL LOW (ref 36.0–46.0)
Hemoglobin: 11.3 g/dL — ABNORMAL LOW (ref 12.0–15.0)
Immature Granulocytes: 1 %
Lymphocytes Relative: 8 %
Lymphs Abs: 0.6 10*3/uL — ABNORMAL LOW (ref 0.7–4.0)
MCH: 32.7 pg (ref 26.0–34.0)
MCHC: 34.2 g/dL (ref 30.0–36.0)
MCV: 95.4 fL (ref 80.0–100.0)
Monocytes Absolute: 0.4 10*3/uL (ref 0.1–1.0)
Monocytes Relative: 6 %
Neutro Abs: 5.8 10*3/uL (ref 1.7–7.7)
Neutrophils Relative %: 84 %
Platelets: 179 10*3/uL (ref 150–400)
RBC: 3.46 MIL/uL — ABNORMAL LOW (ref 3.87–5.11)
RDW: 13.1 % (ref 11.5–15.5)
WBC: 6.9 10*3/uL (ref 4.0–10.5)
nRBC: 0 % (ref 0.0–0.2)

## 2020-01-27 LAB — BASIC METABOLIC PANEL
Anion gap: 11 (ref 5–15)
BUN: 47 mg/dL — ABNORMAL HIGH (ref 8–23)
CO2: 22 mmol/L (ref 22–32)
Calcium: 9.3 mg/dL (ref 8.9–10.3)
Chloride: 101 mmol/L (ref 98–111)
Creatinine, Ser: 1.39 mg/dL — ABNORMAL HIGH (ref 0.44–1.00)
GFR calc Af Amer: 39 mL/min — ABNORMAL LOW (ref >60)
GFR calc non Af Amer: 34 mL/min — ABNORMAL LOW (ref >60)
Glucose, Bld: 118 mg/dL — ABNORMAL HIGH (ref 70–99)
Potassium: 4.7 mmol/L (ref 3.5–5.1)
Sodium: 134 mmol/L — ABNORMAL LOW (ref 135–145)

## 2020-01-27 LAB — PROTIME-INR
INR: 1 (ref 0.8–1.2)
Prothrombin Time: 12.6 seconds (ref 11.4–15.2)

## 2020-01-27 LAB — RESPIRATORY PANEL BY RT PCR (FLU A&B, COVID)
Influenza A by PCR: NEGATIVE
Influenza B by PCR: NEGATIVE
SARS Coronavirus 2 by RT PCR: NEGATIVE

## 2020-01-27 LAB — SURGICAL PCR SCREEN
MRSA, PCR: NEGATIVE
Staphylococcus aureus: NEGATIVE

## 2020-01-27 LAB — ABO/RH: ABO/RH(D): A NEG

## 2020-01-27 LAB — GLUCOSE, CAPILLARY: Glucose-Capillary: 96 mg/dL (ref 70–99)

## 2020-01-27 SURGERY — INSERTION, INTRAMEDULLARY ROD, FEMUR
Anesthesia: General | Laterality: Right

## 2020-01-27 MED ORDER — DOCUSATE SODIUM 100 MG PO CAPS
100.0000 mg | ORAL_CAPSULE | Freq: Two times a day (BID) | ORAL | Status: DC
  Administered 2020-01-27 – 2020-02-01 (×10): 100 mg via ORAL
  Filled 2020-01-27 (×10): qty 1

## 2020-01-27 MED ORDER — ONDANSETRON HCL 4 MG/2ML IJ SOLN: 4.0000 mg | Freq: Once | INTRAMUSCULAR | Status: DC | PRN

## 2020-01-27 MED ORDER — HYDROCODONE-ACETAMINOPHEN 5-325 MG PO TABS
1.0000 | ORAL_TABLET | ORAL | Status: DC | PRN
  Administered 2020-01-27: 1 via ORAL
  Administered 2020-01-29: 2 via ORAL
  Filled 2020-01-27: qty 2
  Filled 2020-01-27: qty 1

## 2020-01-27 MED ORDER — ONDANSETRON HCL 4 MG/2ML IJ SOLN
4.0000 mg | Freq: Four times a day (QID) | INTRAMUSCULAR | Status: DC | PRN
  Administered 2020-01-27: 11:00:00 4 mg via INTRAVENOUS
  Filled 2020-01-27: qty 2

## 2020-01-27 MED ORDER — ATENOLOL 25 MG PO TABS
25.0000 mg | ORAL_TABLET | Freq: Two times a day (BID) | ORAL | Status: DC
  Administered 2020-01-28: 25 mg via ORAL
  Filled 2020-01-27 (×4): qty 1

## 2020-01-27 MED ORDER — METOCLOPRAMIDE HCL 5 MG PO TABS: 5.0000 mg | ORAL_TABLET | Freq: Three times a day (TID) | ORAL | Status: DC | PRN

## 2020-01-27 MED ORDER — HYDRALAZINE HCL 20 MG/ML IJ SOLN
5.0000 mg | INTRAMUSCULAR | Status: DC | PRN
  Administered 2020-01-30 – 2020-01-31 (×3): 5 mg via INTRAVENOUS
  Filled 2020-01-27 (×3): qty 1

## 2020-01-27 MED ORDER — ENSURE PRE-SURGERY PO LIQD
296.0000 mL | Freq: Once | ORAL | Status: DC
  Filled 2020-01-27: qty 296

## 2020-01-27 MED ORDER — TRANEXAMIC ACID-NACL 1000-0.7 MG/100ML-% IV SOLN
1000.0000 mg | INTRAVENOUS | Status: AC
  Administered 2020-01-27: 18:00:00 1000 mg via INTRAVENOUS

## 2020-01-27 MED ORDER — MORPHINE SULFATE (PF) 2 MG/ML IV SOLN
0.5000 mg | INTRAVENOUS | Status: DC | PRN
  Administered 2020-01-27 (×2): 0.5 mg via INTRAVENOUS
  Filled 2020-01-27 (×2): qty 1

## 2020-01-27 MED ORDER — SODIUM CHLORIDE 0.9 % IV SOLN: INTRAVENOUS | Status: AC

## 2020-01-27 MED ORDER — ROCURONIUM BROMIDE 10 MG/ML (PF) SYRINGE
PREFILLED_SYRINGE | INTRAVENOUS | Status: DC | PRN
  Administered 2020-01-27: 30 mg via INTRAVENOUS

## 2020-01-27 MED ORDER — FENTANYL CITRATE (PF) 100 MCG/2ML IJ SOLN
25.0000 ug | INTRAMUSCULAR | Status: DC | PRN
  Administered 2020-01-27 (×2): 25 ug via INTRAVENOUS

## 2020-01-27 MED ORDER — LACTATED RINGERS IV SOLN: INTRAVENOUS | Status: DC

## 2020-01-27 MED ORDER — PROPOFOL 10 MG/ML IV BOLUS
INTRAVENOUS | Status: DC | PRN
  Administered 2020-01-27: 70 mg via INTRAVENOUS

## 2020-01-27 MED ORDER — POLYETHYLENE GLYCOL 3350 17 G PO PACK: 17.0000 g | PACK | Freq: Every day | ORAL | Status: DC | PRN

## 2020-01-27 MED ORDER — CEFAZOLIN SODIUM-DEXTROSE 2-4 GM/100ML-% IV SOLN
2.0000 g | Freq: Four times a day (QID) | INTRAVENOUS | Status: AC
  Administered 2020-01-28 (×2): 2 g via INTRAVENOUS
  Filled 2020-01-27 (×3): qty 100

## 2020-01-27 MED ORDER — HYDROCODONE-ACETAMINOPHEN 5-325 MG PO TABS: 1.0000 | ORAL_TABLET | Freq: Four times a day (QID) | ORAL | Status: DC | PRN

## 2020-01-27 MED ORDER — FENTANYL CITRATE (PF) 100 MCG/2ML IJ SOLN
INTRAMUSCULAR | Status: DC | PRN
  Administered 2020-01-27: 25 ug via INTRAVENOUS
  Administered 2020-01-27: 50 ug via INTRAVENOUS
  Administered 2020-01-27: 25 ug via INTRAVENOUS

## 2020-01-27 MED ORDER — MENTHOL 3 MG MT LOZG
1.0000 | LOZENGE | OROMUCOSAL | Status: DC | PRN
  Administered 2020-01-27: 3 mg via ORAL
  Filled 2020-01-27: qty 9

## 2020-01-27 MED ORDER — SUCCINYLCHOLINE CHLORIDE 20 MG/ML IJ SOLN
INTRAMUSCULAR | Status: DC | PRN
  Administered 2020-01-27: 80 mg via INTRAVENOUS

## 2020-01-27 MED ORDER — SUGAMMADEX SODIUM 200 MG/2ML IV SOLN
INTRAVENOUS | Status: DC | PRN
  Administered 2020-01-27: 125 mg via INTRAVENOUS

## 2020-01-27 MED ORDER — 0.9 % SODIUM CHLORIDE (POUR BTL) OPTIME
TOPICAL | Status: DC | PRN
  Administered 2020-01-27: 1000 mL

## 2020-01-27 MED ORDER — MORPHINE SULFATE (PF) 2 MG/ML IV SOLN: 0.5000 mg | INTRAVENOUS | Status: DC | PRN

## 2020-01-27 MED ORDER — SENNA 8.6 MG PO TABS
1.0000 | ORAL_TABLET | Freq: Two times a day (BID) | ORAL | Status: DC
  Administered 2020-01-27 – 2020-02-01 (×10): 8.6 mg via ORAL
  Filled 2020-01-27 (×10): qty 1

## 2020-01-27 MED ORDER — CHLORHEXIDINE GLUCONATE 4 % EX LIQD: 60.0000 mL | Freq: Once | CUTANEOUS | Status: DC

## 2020-01-27 MED ORDER — ATENOLOL 25 MG PO TABS
25.0000 mg | ORAL_TABLET | Freq: Two times a day (BID) | ORAL | Status: DC
  Administered 2020-01-27: 25 mg via ORAL
  Filled 2020-01-27 (×2): qty 1

## 2020-01-27 MED ORDER — ASPIRIN EC 81 MG PO TBEC
81.0000 mg | DELAYED_RELEASE_TABLET | Freq: Two times a day (BID) | ORAL | Status: DC
  Administered 2020-01-28 – 2020-01-29 (×3): 81 mg via ORAL
  Filled 2020-01-27 (×3): qty 1

## 2020-01-27 MED ORDER — METOCLOPRAMIDE HCL 5 MG/ML IJ SOLN: 5.0000 mg | Freq: Three times a day (TID) | INTRAMUSCULAR | Status: DC | PRN

## 2020-01-27 MED ORDER — ONDANSETRON HCL 4 MG/2ML IJ SOLN: 4.0000 mg | Freq: Four times a day (QID) | INTRAMUSCULAR | Status: DC | PRN

## 2020-01-27 MED ORDER — FENTANYL CITRATE (PF) 100 MCG/2ML IJ SOLN
INTRAMUSCULAR | Status: AC
  Filled 2020-01-27: qty 2

## 2020-01-27 MED ORDER — DEXAMETHASONE SODIUM PHOSPHATE 10 MG/ML IJ SOLN
INTRAMUSCULAR | Status: DC | PRN
  Administered 2020-01-27: 5 mg via INTRAVENOUS

## 2020-01-27 MED ORDER — ACETAMINOPHEN 325 MG PO TABS
325.0000 mg | ORAL_TABLET | Freq: Four times a day (QID) | ORAL | Status: DC | PRN
  Administered 2020-01-30: 07:00:00 650 mg via ORAL
  Administered 2020-01-31: 325 mg via ORAL
  Administered 2020-01-31 (×2): 650 mg via ORAL
  Filled 2020-01-27 (×3): qty 2

## 2020-01-27 MED ORDER — TRANEXAMIC ACID-NACL 1000-0.7 MG/100ML-% IV SOLN
INTRAVENOUS | Status: AC
  Filled 2020-01-27: qty 100

## 2020-01-27 MED ORDER — ACETAMINOPHEN 500 MG PO TABS
500.0000 mg | ORAL_TABLET | Freq: Four times a day (QID) | ORAL | Status: AC
  Administered 2020-01-27 – 2020-01-28 (×4): 500 mg via ORAL
  Filled 2020-01-27 (×4): qty 1

## 2020-01-27 MED ORDER — METHOCARBAMOL 500 MG IVPB - SIMPLE MED
INTRAVENOUS | Status: AC
  Administered 2020-01-27: 19:00:00 500 mg
  Filled 2020-01-27: qty 50

## 2020-01-27 MED ORDER — CEFAZOLIN SODIUM-DEXTROSE 2-4 GM/100ML-% IV SOLN
2.0000 g | INTRAVENOUS | Status: AC
  Administered 2020-01-27: 2 g via INTRAVENOUS
  Filled 2020-01-27: qty 100

## 2020-01-27 MED ORDER — METHOCARBAMOL 500 MG PO TABS
500.0000 mg | ORAL_TABLET | Freq: Four times a day (QID) | ORAL | Status: DC | PRN
  Administered 2020-01-28: 500 mg via ORAL
  Filled 2020-01-27: qty 1

## 2020-01-27 MED ORDER — ALUM & MAG HYDROXIDE-SIMETH 200-200-20 MG/5ML PO SUSP: 30.0000 mL | ORAL | Status: DC | PRN

## 2020-01-27 MED ORDER — ACETAMINOPHEN 10 MG/ML IV SOLN: 1000.0000 mg | Freq: Once | INTRAVENOUS | Status: DC | PRN

## 2020-01-27 MED ORDER — FENTANYL CITRATE (PF) 250 MCG/5ML IJ SOLN
INTRAMUSCULAR | Status: AC
  Filled 2020-01-27: qty 5

## 2020-01-27 MED ORDER — LACTATED RINGERS IV BOLUS
1000.0000 mL | Freq: Once | INTRAVENOUS | Status: AC
  Administered 2020-01-27: 02:00:00 1000 mL via INTRAVENOUS

## 2020-01-27 MED ORDER — LACTATED RINGERS IV SOLN: INTRAVENOUS | Status: DC | PRN

## 2020-01-27 MED ORDER — PROPOFOL 10 MG/ML IV BOLUS
INTRAVENOUS | Status: AC
  Filled 2020-01-27: qty 20

## 2020-01-27 MED ORDER — POVIDONE-IODINE 10 % EX SWAB: 2.0000 "application " | Freq: Once | CUTANEOUS | Status: DC

## 2020-01-27 MED ORDER — ONDANSETRON HCL 4 MG/2ML IJ SOLN
INTRAMUSCULAR | Status: DC | PRN
  Administered 2020-01-27: 4 mg via INTRAVENOUS

## 2020-01-27 MED ORDER — MEMANTINE HCL 10 MG PO TABS
5.0000 mg | ORAL_TABLET | Freq: Every day | ORAL | Status: DC
  Administered 2020-01-28 – 2020-02-01 (×5): 5 mg via ORAL
  Filled 2020-01-27 (×6): qty 1

## 2020-01-27 MED ORDER — ONDANSETRON HCL 4 MG PO TABS
4.0000 mg | ORAL_TABLET | Freq: Four times a day (QID) | ORAL | Status: DC | PRN
  Administered 2020-01-27: 21:00:00 4 mg via ORAL
  Filled 2020-01-27: qty 1

## 2020-01-27 MED ORDER — LIDOCAINE 2% (20 MG/ML) 5 ML SYRINGE
INTRAMUSCULAR | Status: DC | PRN
  Administered 2020-01-27: 60 mg via INTRAVENOUS

## 2020-01-27 MED ORDER — FERROUS SULFATE 325 (65 FE) MG PO TABS
325.0000 mg | ORAL_TABLET | Freq: Three times a day (TID) | ORAL | Status: DC
  Administered 2020-01-28 – 2020-02-01 (×12): 325 mg via ORAL
  Filled 2020-01-27 (×12): qty 1

## 2020-01-27 MED ORDER — METHOCARBAMOL 500 MG IVPB - SIMPLE MED
500.0000 mg | Freq: Four times a day (QID) | INTRAVENOUS | Status: DC | PRN
  Filled 2020-01-27: qty 50

## 2020-01-27 MED ORDER — HYDROCODONE-ACETAMINOPHEN 7.5-325 MG PO TABS: 1.0000 | ORAL_TABLET | ORAL | Status: DC | PRN

## 2020-01-27 MED ORDER — PHENOL 1.4 % MT LIQD
1.0000 | OROMUCOSAL | Status: DC | PRN
  Filled 2020-01-27: qty 177

## 2020-01-27 SURGICAL SUPPLY — 34 items
BAG ZIPLOCK 12X15 (MISCELLANEOUS) IMPLANT
BIT DRILL CANN LG 4.3MM (BIT) IMPLANT
BNDG GAUZE ELAST 4 BULKY (GAUZE/BANDAGES/DRESSINGS) ×2 IMPLANT
COVER PERINEAL POST (MISCELLANEOUS) ×2 IMPLANT
COVER SURGICAL LIGHT HANDLE (MISCELLANEOUS) ×2 IMPLANT
COVER WAND RF STERILE (DRAPES) IMPLANT
DRAPE STERI IOBAN 125X83 (DRAPES) ×2 IMPLANT
DRILL BIT CANN LG 4.3MM (BIT) ×2
DRSG AQUACEL AG ADV 3.5X 4 (GAUZE/BANDAGES/DRESSINGS) ×2 IMPLANT
DRSG AQUACEL AG ADV 3.5X 6 (GAUZE/BANDAGES/DRESSINGS) ×1 IMPLANT
DURAPREP 26ML APPLICATOR (WOUND CARE) ×2 IMPLANT
ELECT REM PT RETURN 15FT ADLT (MISCELLANEOUS) ×2 IMPLANT
GLOVE BIOGEL PI IND STRL 7.5 (GLOVE) ×1 IMPLANT
GLOVE BIOGEL PI IND STRL 8.5 (GLOVE) ×1 IMPLANT
GLOVE BIOGEL PI INDICATOR 7.5 (GLOVE) ×1
GLOVE BIOGEL PI INDICATOR 8.5 (GLOVE) ×1
GLOVE ECLIPSE 8.0 STRL XLNG CF (GLOVE) ×2 IMPLANT
GLOVE ORTHO TXT STRL SZ7.5 (GLOVE) ×2 IMPLANT
GOWN STRL REUS W/TWL LRG LVL3 (GOWN DISPOSABLE) ×2 IMPLANT
GOWN STRL REUS W/TWL XL LVL3 (GOWN DISPOSABLE) ×2 IMPLANT
GUIDEPIN 3.2X17.5 THRD DISP (PIN) ×1 IMPLANT
HFN 125 DEG 11MM X 180MM (Orthopedic Implant) ×1 IMPLANT
KIT BASIN OR (CUSTOM PROCEDURE TRAY) ×2 IMPLANT
KIT TURNOVER KIT A (KITS) IMPLANT
MANIFOLD NEPTUNE II (INSTRUMENTS) ×2 IMPLANT
PACK GENERAL/GYN (CUSTOM PROCEDURE TRAY) ×2 IMPLANT
PENCIL SMOKE EVACUATOR (MISCELLANEOUS) IMPLANT
PROTECTOR NERVE ULNAR (MISCELLANEOUS) ×2 IMPLANT
SCREW BONE CORTICAL 5.0X3 (Screw) ×1 IMPLANT
SCREW LAG HIP NAIL 10.5X95 (Screw) ×1 IMPLANT
SUT VIC AB 1 CT1 27 (SUTURE) ×1
SUT VIC AB 1 CT1 27XBRD ANTBC (SUTURE) ×1 IMPLANT
SUT VIC AB 2-0 CT1 27 (SUTURE) ×1
SUT VIC AB 2-0 CT1 TAPERPNT 27 (SUTURE) ×1 IMPLANT

## 2020-01-27 NOTE — Anesthesia Postprocedure Evaluation (Signed)
Anesthesia Post Note  Patient: Charlene Garza  Procedure(s) Performed: INTRAMEDULLARY (IM) NAIL FEMORAL (Right )     Patient location during evaluation: PACU Anesthesia Type: General Level of consciousness: awake and alert Pain management: pain level controlled Vital Signs Assessment: post-procedure vital signs reviewed and stable Respiratory status: spontaneous breathing, nonlabored ventilation, respiratory function stable and patient connected to nasal cannula oxygen Cardiovascular status: blood pressure returned to baseline and stable Postop Assessment: no apparent nausea or vomiting Anesthetic complications: no    Last Vitals:  Vitals:   01/27/20 1945 01/27/20 2000  BP: 107/73 118/66  Pulse: (!) 48   Resp: 12 14  Temp: 36.4 C   SpO2: 100% 100%    Last Pain:  Vitals:   01/27/20 1945  TempSrc:   PainSc: 3                  Irja Wheless S

## 2020-01-27 NOTE — ED Provider Notes (Signed)
[facility]-EMERGENCY DEPT Provider Note   CSN: [id number] Arrival date & time: 01/26/20  2231     History No chief complaint on file.   Charlene Garza is a 86 y.o. female.  HPI     86 year old female with history of hypertension, hyperlipidemia comes in a chief complaint of fall. Patient reports that she lost her balance and essentially slid onto her side.  Patient had no significant pain was unable to get up after the fall.  She denies any headache, neck pain, back pain.  She is not on any blood thinners.  Past Medical History:  Diagnosis Date  . Anemia   . Arthritis   . Hyperlipidemia   . Hypertension   . Osteopenia    T score : - 2.4 @ R femoral neck  . Renal insufficiency     Patient Active Problem List   Diagnosis Date Noted  . Memory loss 11/28/2019  . Hearing loss due to cerumen impaction, bilateral 09/05/2019  . Routine general medical examination at a health care facility 01/25/2018  . Osteoarthritis 01/25/2018  . Venous insufficiency 07/17/2017  . Mild renal insufficiency 03/07/2012  . Vitamin D deficiency 04/30/2008  . Hyperlipidemia 04/30/2008  . Essential hypertension 05/09/2007    Past Surgical History:  Procedure Laterality Date  . CATARACT EXTRACTION     bilaterally  . CHOLECYSTECTOMY  10/17/2012   Procedure: LAPAROSCOPIC CHOLECYSTECTOMY WITH INTRAOPERATIVE CHOLANGIOGRAM;  Surgeon: Axel Filler, MD;  Location: [facility] OR;  Service: General;  Laterality: N/A;  . KNEE SURGERY  10/2003   L TKR     OB History    Gravida  3   Para  1   Term      Preterm      AB      Living        SAB      TAB      Ectopic      Multiple      Live Births              Family History  Problem Relation Age of Onset  . Hypertension Father   . Hypertension Mother   . Heart attack Mother 56  . Hypertension Brother   . Diabetes Neg Hx   . Stroke Neg Hx     Social History   Tobacco Use  . Smoking status: Never Smoker    . Smokeless tobacco: Never Used  Substance Use Topics  . Alcohol use: Yes    Alcohol/week: 7.0 standard drinks    Types: 7 Shots of liquor per week  . Drug use: No    Home Medications Prior to Admission medications   Medication Sig Start Date End Date Taking? Authorizing Provider  atenolol (TENORMIN) 25 MG tablet Take 1 tablet (25 mg total) by mouth 2 (two) times daily. Need office visit before next refill 09/09/19  Yes Myrlene Broker, MD  memantine (NAMENDA) 5 MG tablet Take 1 tablet (5 mg total) by mouth daily. 11/28/19  Yes Myrlene Broker, MD  HYDROcodone-acetaminophen (NORCO/VICODIN) 5-325 MG tablet Take 1-2 tablets by mouth every 6 (six) hours as needed for moderate pain. Patient not taking: Reported on 01/27/2020 01/23/18   Myrlene Broker, MD    Allergies    Ambien cr [zolpidem tartrate er] and Lovastatin  Review of Systems   Review of Systems  Constitutional: Positive for activity change.  Respiratory: Negative for shortness of breath.   Cardiovascular: Negative for chest pain.  Musculoskeletal:  Positive for arthralgias.  Allergic/Immunologic: Negative for immunocompromised state.  Neurological: Negative for headaches.  Hematological: Does not bruise/bleed easily.  All other systems reviewed and are negative.   Physical Exam Updated Vital Signs BP (!) 186/118   Pulse 65   Temp 98.6 F (37 C) (Oral)   Resp (!) 22   SpO2 100%   Physical Exam Vitals and nursing note reviewed.  Constitutional:      Appearance: She is well-developed.  HENT:     Head: Normocephalic and atraumatic.  Cardiovascular:     Rate and Rhythm: Normal rate.  Pulmonary:     Effort: Pulmonary effort is normal.  Abdominal:     General: Bowel sounds are normal.  Musculoskeletal:     Cervical back: Normal range of motion and neck supple.     Comments: Patient has externally rotated and shortened right lower extremity.  Otherwise:  Head to toe evaluation shows no hematoma,  bleeding of the scalp, no facial abrasions, no spine step offs, crepitus of the chest or neck, no tenderness to palpation of the bilateral upper and lower extremities, no gross deformities, no chest tenderness, no pelvic pain.   Skin:    General: Skin is warm and dry.  Neurological:     Mental Status: She is alert and oriented to person, place, and time.     ED Results / Procedures / Treatments   Labs (all labs ordered are listed, but only abnormal results are displayed) Labs Reviewed  BASIC METABOLIC PANEL - Abnormal; Notable for the following components:      Result Value   Sodium 134 (*)    Glucose, Bld 118 (*)    BUN 47 (*)    Creatinine, Ser 1.39 (*)    GFR calc non Af Amer 34 (*)    GFR calc Af Amer 39 (*)    All other components within normal limits  CBC WITH DIFFERENTIAL/PLATELET - Abnormal; Notable for the following components:   RBC 3.46 (*)    Hemoglobin 11.3 (*)    HCT 33.0 (*)    Lymphs Abs 0.6 (*)    All other components within normal limits  RESPIRATORY PANEL BY RT PCR (FLU A&B, COVID)  PROTIME-INR  TYPE AND SCREEN  ABO/RH    EKG EKG Interpretation  Date/Time:  Monday January 26 2020 22:50:47 EST Ventricular Rate:  67 PR Interval:    QRS Duration: 100 QT Interval:  428 QTC Calculation: 452 R Axis:   -31 Text Interpretation: Sinus rhythm Supraventricular bigeminy Left axis deviation No acute changes Confirmed by Varney Biles ([id number]) on 01/27/2020 1:12:28 AM   Radiology DG Chest 1 View  Result Date: 01/26/2020 CLINICAL DATA:  Fall.  Preop EXAM: CHEST  1 VIEW COMPARISON:  10/14/2012 FINDINGS: Cardiomegaly. Calcified granuloma in the right mid lung. No confluent opacities or effusions. No acute bony abnormality. IMPRESSION: Cardiomegaly.  No active cardiopulmonary disease. Electronically Signed   By: Rolm Baptise M.D.   On: 01/26/2020 23:09   CT HEAD WO CONTRAST  Result Date: 01/26/2020 CLINICAL DATA:  Fall EXAM: CT HEAD WITHOUT CONTRAST TECHNIQUE:  Contiguous axial images were obtained from the base of the skull through the vertex without intravenous contrast. COMPARISON:  None. FINDINGS: Brain: There is atrophy and chronic small vessel disease changes. No acute intracranial abnormality. Specifically, no hemorrhage, hydrocephalus, mass lesion, acute infarction, or significant intracranial injury. Vascular: No hyperdense vessel or unexpected calcification. Skull: No acute calvarial abnormality. Sinuses/Orbits: Visualized paranasal sinuses and mastoids clear. Orbital soft  tissues unremarkable. Other: None IMPRESSION: Atrophy, chronic microvascular disease. No acute intracranial abnormality. Electronically Signed   By: Charlett Nose M.D.   On: 01/26/2020 23:36   CT CERVICAL SPINE WO CONTRAST  Result Date: 01/26/2020 CLINICAL DATA:  Fall EXAM: CT CERVICAL SPINE WITHOUT CONTRAST TECHNIQUE: Multidetector CT imaging of the cervical spine was performed without intravenous contrast. Multiplanar CT image reconstructions were also generated. COMPARISON:  None. FINDINGS: Alignment: No subluxation. Skull base and vertebrae: No acute fracture. No primary bone lesion or focal pathologic process. Soft tissues and spinal canal: No prevertebral fluid or swelling. No visible canal hematoma. Disc levels: Advanced diffuse degenerative facet disease and moderate degenerative disc disease. Upper chest: No acute findings Other: Densely calcified carotid arteries bilaterally. IMPRESSION: Degenerative disc and facet disease. No acute bony abnormality. Electronically Signed   By: Charlett Nose M.D.   On: 01/26/2020 23:38   DG Hip Unilat With Pelvis 2-3 Views Right  Result Date: 01/26/2020 CLINICAL DATA:  Fall, right hip pain EXAM: DG HIP (WITH OR WITHOUT PELVIS) 2-3V RIGHT COMPARISON:  None. FINDINGS: There is a right femoral intertrochanteric fracture with displacement and varus angulation. No subluxation or dislocation. No additional acute bony abnormality. IMPRESSION: Right  intertrochanteric fracture with varus angulation. Electronically Signed   By: Charlett Nose M.D.   On: 01/26/2020 23:11    Procedures Procedures (including critical care time)  Medications Ordered in ED Medications  fentaNYL (SUBLIMAZE) injection 50 mcg (50 mcg Intravenous Given 01/27/20 0041)    ED Course  I have reviewed the triage vital signs and the nursing notes.  Pertinent labs & imaging results that were available during my care of the patient were reviewed by me and considered in my medical decision making (see chart for details).    MDM Rules/Calculators/A&P                      86 year old comes in a chief complaint of fall. Based on exam we are suspecting that she has right hip fracture.  X-ray confirms a femoral neck fracture.  CT head and C-spine negative.  I discussed the case with [name], he wants patient to stay n.p.o. Medicine to admit.  Results from the ER workup discussed with the patient face to face and all questions answered to the best of my ability.    Final Clinical Impression(s) / ED Diagnoses Final diagnoses:  Closed fracture of right hip, initial encounter [facility])    Rx / DC Orders ED Discharge Orders    None       Derwood Kaplan, MD 01/27/20 [id number]

## 2020-01-27 NOTE — Interval H&P Note (Signed)
History and Physical Interval Note:  01/27/2020 5:34 PM  Charlene Garza  has presented today for surgery, with the diagnosis of right intertroch fx.  The various methods of treatment have been discussed with the patient and family. After consideration of risks, benefits and other options for treatment, the patient has consented to  Procedure(s): INTRAMEDULLARY (IM) NAIL FEMORAL (Right) as a surgical intervention.  The patient's history has been reviewed, patient examined, no change in status, stable for surgery.  I have reviewed the patient's chart and labs.  Questions were answered to the patient's satisfaction.     [name]

## 2020-01-27 NOTE — Anesthesia Procedure Notes (Signed)
Procedure Name: Intubation Performed by: Sierrah Luevano J, CRNA Pre-anesthesia Checklist: Patient identified, Emergency Drugs available, Suction available, Patient being monitored and Timeout performed Patient Re-evaluated:Patient Re-evaluated prior to induction Oxygen Delivery Method: Circle system utilized Preoxygenation: Pre-oxygenation with 100% oxygen Induction Type: IV induction Ventilation: Mask ventilation without difficulty Laryngoscope Size: Mac and 4 Grade View: Grade I Tube type: Oral Tube size: 7.0 mm Number of attempts: 1 Airway Equipment and Method: Stylet Placement Confirmation: ETT inserted through vocal cords under direct vision,  positive ETCO2 and breath sounds checked- equal and bilateral Secured at: 21 cm Tube secured with: Tape Dental Injury: Teeth and Oropharynx as per pre-operative assessment        

## 2020-01-27 NOTE — Op Note (Signed)
Charlene Garza, [facility] MEDICAL RECORD [id number] ACCOUNT [id number] DATE OF BIRTH:December 03, 1930 FACILITY: [facility]ATION: [id number] PHYSICIAN:Namon Villarin Marian Sorrow, MD  OPERATIVE REPORT  DATE OF PROCEDURE:  01/27/2020  PREOPERATIVE DIAGNOSIS:  Comminuted right intertrochanteric femur fracture.  POSTOPERATIVE DIAGNOSIS:  Comminuted right intertrochanteric femur fracture.  PROCEDURE:  Open reduction internal fixation of right intertrochanteric femur fracture utilizing 11 x 180 mm nail with 125 mm lag screw and a distal interlock.  SURGEON:  Paralee Cancel, MD  ASSISTANT:  Karen Chafe, PA-C.  Note that Ms. [name] was present for the entirety of the case from preoperative positioning, perioperative management of the operative extremity, facilitation of fracture reduction, general facilitation of the case and primary wound closure.  ANESTHESIA:  General.  SPECIMENS:  None.  COMPLICATIONS:  None.  BLOOD LOSS:  Around 100 mL.  INDICATIONS:  The patient is a pleasant, healthy 86 year old female who unfortunately had a ground level fall.  She was brought to the emergency room with increased right hip pain, deformity and inability to bear weight.  Radiographs in the emergency  room revealed a comminuted intertrochanteric femur fracture.  Per protocol, she was admitted to the hospitalist service.  Orthopedics was consulted for fracture management.  Risks, benefits and necessity of the procedure were discussed for fracture  management.  Risks of infection, nonunion, malunion, need for future surgeries based on osteoporotic bone was reviewed.  Consent was obtained for benefit of fracture care.  DESCRIPTION OF PROCEDURE:  The patient was brought to the operative theater.  Once adequate anesthesia, preoperative antibiotics, Ancef administered, as well as tranexamic acid, she was positioned supine on the Hana table.  All bony prominences were  carefully protected and padded.  Her left leg was flexed and  abducted out of the way with the lateral aspect of her knee and padded to protect the peroneal nerve.  The right foot was placed in a traction boot.  Traction and internal rotation was applied  and under fluoroscopic imaging, we confirmed the near anatomic reduction with significant displacement of the lesser trochanter, as well as comminution of the greater trochanter.  Once this was done, the right hip was prepped and draped in sterile  fashion using shower curtain technique.  Timeout was performed identifying the patient, the planned procedure and extremity.  An incision was then made proximal to the lateral tip of the trochanter, confirmed radiographically.  A guidewire was then  inserted under fluoroscopic guidance into the tip of the trochanter, noted again to be comminuted and passed past the fracture site.  The proximal femur was then opened with a drill.  I then passed the 11 x 180 nail by hand to its appropriate depth.   Once it was in appropriate depth, the guidewire was inserted into the near center of the head in AP and lateral planes.  We then measured and selected a 95 mm lag screw.  We drilled for this and then passed the lag screw.  We then took gross traction off  the lower extremity and medialized the shaft to the fracture site.  Once this was done, utilizing the insertion jig, a distal interlock screw was placed measuring 34 mm.  Once this was done, the final x-rays were obtained in AP and lateral planes.  The  wounds were irrigated with normal saline solution.  The proximal wound was closed with #1 Vicryl in the gluteal fascia.  The remainder of the wounds were closed with 2-0 Vicryl and a Monocryl stitch as needed.  The wounds  were then cleaned, dried and  dressed sterilely using surgical glue and Aquacel dressing.  The patient was brought to the recovery room in stable condition, tolerating the procedure well.  Findings were reviewed with family.  Postoperatively, she will be partial  weightbearing, placed  on DVT prophylaxis.  VN/NUANCE  D:01/27/2020 T:01/27/2020 JOB:[id number]

## 2020-01-27 NOTE — Brief Op Note (Signed)
01/26/2020 - 01/27/2020  5:35 PM  PATIENT:  Charlene Garza  86 y.o. female  PRE-OPERATIVE DIAGNOSIS:  Right comminuted intertrochanteric fracture  POST-OPERATIVE DIAGNOSIS:  Right comminuted intertrochanteric fracture  PROCEDURE:  Procedure(s): INTRAMEDULLARY (IM) NAIL FEMORAL (Right)  SURGEON:  Surgeon(s) and Role:    Durene Romans, MD - Primary  PHYSICIAN ASSISTANT: Dennie Bible, PA-C   ANESTHESIA:   general  EBL:  100 cc  BLOOD ADMINISTERED:none  DRAINS: none   LOCAL MEDICATIONS USED:  NONE  SPECIMEN:  No Specimen  DISPOSITION OF SPECIMEN:  N/A  COUNTS:  YES  TOURNIQUET:  * No tourniquets in log *  DICTATION: .Other Dictation: Dictation Number [id number]  PLAN OF CARE: Admit to inpatient   PATIENT DISPOSITION:  PACU - hemodynamically stable.   Delay start of Pharmacological VTE agent (>24hrs) due to surgical blood loss or risk of bleeding: no

## 2020-01-27 NOTE — Discharge Instructions (Signed)
HIP FRACTURE POST-OPERATIVE DISCHARGE INSTRUCTIONS   Hip Rehabilitation, Guidelines Following Surgery   HOME CARE INSTRUCTIONS  . Remove items at home which could result in a fall. This includes throw rugs or furniture in walking pathways.   ICE to the affected hip every three hours for 30 minutes at a time and then as needed for pain and swelling.  Continue to use ice on the hip for pain and swelling from surgery. You may notice swelling that will progress down to the foot and ankle.  This is normal after surgery.  Elevate the leg when you are not up walking on it.    Continue to use the breathing machine which will help keep your temperature down.  It is common for your temperature to cycle up and down following surgery, especially at night when you are not up moving around and exerting yourself.  The breathing machine keeps your lungs expanded and your temperature down.  DRESSING / WOUND CARE / SHOWERING The dressing used is waterproof, and does not need to be covered while in the shower.  NO SOAKING/SUBMERGING IN THE BATHTUB.    ACTIVITY Walk with your walker as instructed. Use walker as long as suggested by your caregivers. Avoid periods of inactivity such as sitting longer than an hour when not asleep. This helps prevent blood clots.  Do not drive a car for 6 weeks or until released by you surgeon.  Do not drive while taking narcotics.  WEIGHT BEARING Partial weight bearing to the right lower extremity with assist device (walker, cane, etc) as directed, use it as long as suggested by your surgeon or therapist, typically at least 4-6 weeks.  MEDICATIONS See your medication summary on the "After Visit Summary" that the nursing staff will review with you prior to discharge.  You may have some home medications which will be placed on hold until you complete the course of blood thinner medication.  It is important for you to complete the blood thinner medication as prescribed by your  surgeon.  Continue your approved medications as instructed at time of discharge.                                                 FOLLOW-UP APPOINTMENTS Make sure you keep all of your appointments after your operation with your surgeon and caregivers. You should call the office at the above phone number and make an appointment for approximately two weeks after the date of your surgery or on the date instructed by your surgeon outlined in the "After Visit Summary".  IF YOU ARE TRANSFERRED TO A SKILLED REHAB FACILITY If the patient is transferred to a skilled rehab facility following release from the hospital, a list of the current medications will be sent to the facility for the patient to continue.  When discharged from the skilled rehab facility, please have the facility set up the patient's [place] prior to being released. Also, the skilled facility will be responsible for providing the patient with their medications at time of release from the facility to include their pain medication, the muscle relaxants, and their blood thinner medication. If the patient is still at the rehab facility at time of the two week follow up appointment, the skilled rehab facility will also need to assist the patient in arranging follow up appointment in our office  and any transportation needs.  MAKE SURE YOU:  . Understand these instructions.  . Get help right away if you are not doing well or get worse.   Do not submerge incision under water. Continue to use ice for pain and swelling after surgery. Do not use any lotions or creams on the incision until instructed by your surgeon.

## 2020-01-27 NOTE — Transfer of Care (Addendum)
Immediate Anesthesia Transfer of Care Note  Patient: Charlene Garza  Procedure(s) Performed: Procedure(s): INTRAMEDULLARY (IM) NAIL FEMORAL (Right)  Patient Location: PACU  Anesthesia Type:General  Level of Consciousness:  sedated, patient cooperative and responds to stimulation  Airway & Oxygen Therapy:Patient Spontanous Breathing and Patient connected to face mask oxgen  Post-op Assessment:  Report given to PACU RN and Post -op Vital signs reviewed and stable  Post vital signs:  Reviewed and stable  Last Vitals:  Vitals:   01/27/20 1002 01/27/20 1326  BP: (!) 186/85 (!) 161/92  Pulse: (!) 54 (!) 53  Resp: 18 18  Temp: 36.7 C 37.1 C  SpO2: 97% 99%    Complications: No apparent anesthesia complications

## 2020-01-27 NOTE — Consult Note (Signed)
ORTHOPAEDIC CONSULTATION  REQUESTING PHYSICIAN: Jae Dire, MD  PCP:  Myrlene Broker, MD  Chief Complaint:   HPI: Charlene Garza is a 86 y.o. female who presented to the [facility] ED on 01/26/20 via EMS with complaints of Right hip pain secondary to fall that day. She reports she felt as though the leg gave way, resulting in her falling. She states she was unable to stand. In the ED, x-rays revealed right intertrochanteric femur fracture with varus angulation. [name] was consulted for orthopaedic management.   Today, she reports she is comfortable at rest. She states she lives at home with her daughter, Charlene Garza, and her son in law. She uses a walker at baseline for ambulation. She denies use of blood thinners.   Past Medical History:  Diagnosis Date  . Anemia   . Arthritis   . Hyperlipidemia   . Hypertension   . Osteopenia    T score : - 2.4 @ R femoral neck  . Renal insufficiency    Past Surgical History:  Procedure Laterality Date  . CATARACT EXTRACTION     bilaterally  . CHOLECYSTECTOMY  10/17/2012   Procedure: LAPAROSCOPIC CHOLECYSTECTOMY WITH INTRAOPERATIVE CHOLANGIOGRAM;  Surgeon: Axel Filler, MD;  Location: [facility] OR;  Service: General;  Laterality: N/A;  . KNEE SURGERY  10/2003   L TKR   Social History   Socioeconomic History  . Marital status: Married    Spouse name: Not on file  . Number of children: Not on file  . Years of education: Not on file  . Highest education level: Not on file  Occupational History  . Not on file  Tobacco Use  . Smoking status: Never Smoker  . Smokeless tobacco: Never Used  Substance and Sexual Activity  . Alcohol use: Yes    Alcohol/week: 7.0 standard drinks    Types: 7 Shots of liquor per week  . Drug use: No  . Sexual activity: Not Currently  Other Topics Concern  . Not on file  Social History Narrative  . Not on file   Social Determinants of Health   Financial Resource Strain:   . Difficulty of  Paying Living Expenses: Not on file  Food Insecurity:   . Worried About Programme researcher, broadcasting/film/video in the Last Year: Not on file  . Ran Out of Food in the Last Year: Not on file  Transportation Needs:   . Lack of Transportation (Medical): Not on file  . Lack of Transportation (Non-Medical): Not on file  Physical Activity:   . Days of Exercise per Week: Not on file  . Minutes of Exercise per Session: Not on file  Stress:   . Feeling of Stress : Not on file  Social Connections:   . Frequency of Communication with Friends and Family: Not on file  . Frequency of Social Gatherings with Friends and Family: Not on file  . Attends Religious Services: Not on file  . Active Member of Clubs or Organizations: Not on file  . Attends Banker Meetings: Not on file  . Marital Status: Not on file   Family History  Problem Relation Age of Onset  . Hypertension Father   . Hypertension Mother   . Heart attack Mother 15  . Hypertension Brother   . Diabetes Neg Hx   . Stroke Neg Hx    Allergies  Allergen Reactions  . Ambien Cr [Zolpidem Tartrate Er]     10/2012 mental status changes postoperatively  .  Lovastatin     10/02/13 CK 276   Prior to Admission medications   Medication Sig Start Date End Date Taking? Authorizing Provider  atenolol (TENORMIN) 25 MG tablet Take 1 tablet (25 mg total) by mouth 2 (two) times daily. Need office visit before next refill 09/09/19  Yes Myrlene Broker, MD  memantine (NAMENDA) 5 MG tablet Take 1 tablet (5 mg total) by mouth daily. 11/28/19  Yes Myrlene Broker, MD  HYDROcodone-acetaminophen (NORCO/VICODIN) 5-325 MG tablet Take 1-2 tablets by mouth every 6 (six) hours as needed for moderate pain. Patient not taking: Reported on 01/27/2020 01/23/18   Myrlene Broker, MD   DG Chest 1 View  Result Date: 01/26/2020 CLINICAL DATA:  Fall.  Preop EXAM: CHEST  1 VIEW COMPARISON:  10/14/2012 FINDINGS: Cardiomegaly. Calcified granuloma in the right mid  lung. No confluent opacities or effusions. No acute bony abnormality. IMPRESSION: Cardiomegaly.  No active cardiopulmonary disease. Electronically Signed   By: Charlett Nose M.D.   On: 01/26/2020 23:09   CT HEAD WO CONTRAST  Result Date: 01/26/2020 CLINICAL DATA:  Fall EXAM: CT HEAD WITHOUT CONTRAST TECHNIQUE: Contiguous axial images were obtained from the base of the skull through the vertex without intravenous contrast. COMPARISON:  None. FINDINGS: Brain: There is atrophy and chronic small vessel disease changes. No acute intracranial abnormality. Specifically, no hemorrhage, hydrocephalus, mass lesion, acute infarction, or significant intracranial injury. Vascular: No hyperdense vessel or unexpected calcification. Skull: No acute calvarial abnormality. Sinuses/Orbits: Visualized paranasal sinuses and mastoids clear. Orbital soft tissues unremarkable. Other: None IMPRESSION: Atrophy, chronic microvascular disease. No acute intracranial abnormality. Electronically Signed   By: Charlett Nose M.D.   On: 01/26/2020 23:36   CT CERVICAL SPINE WO CONTRAST  Result Date: 01/26/2020 CLINICAL DATA:  Fall EXAM: CT CERVICAL SPINE WITHOUT CONTRAST TECHNIQUE: Multidetector CT imaging of the cervical spine was performed without intravenous contrast. Multiplanar CT image reconstructions were also generated. COMPARISON:  None. FINDINGS: Alignment: No subluxation. Skull base and vertebrae: No acute fracture. No primary bone lesion or focal pathologic process. Soft tissues and spinal canal: No prevertebral fluid or swelling. No visible canal hematoma. Disc levels: Advanced diffuse degenerative facet disease and moderate degenerative disc disease. Upper chest: No acute findings Other: Densely calcified carotid arteries bilaterally. IMPRESSION: Degenerative disc and facet disease. No acute bony abnormality. Electronically Signed   By: Charlett Nose M.D.   On: 01/26/2020 23:38   DG Hip Unilat With Pelvis 2-3 Views Right  Result  Date: 01/26/2020 CLINICAL DATA:  Fall, right hip pain EXAM: DG HIP (WITH OR WITHOUT PELVIS) 2-3V RIGHT COMPARISON:  None. FINDINGS: There is a right femoral intertrochanteric fracture with displacement and varus angulation. No subluxation or dislocation. No additional acute bony abnormality. IMPRESSION: Right intertrochanteric fracture with varus angulation. Electronically Signed   By: Charlett Nose M.D.   On: 01/26/2020 23:11    Positive ROS: All other systems have been reviewed and were otherwise negative with the exception of those mentioned in the HPI and as above.  Physical Exam: General: Alert, no acute distress Cardiovascular: No pedal edema Respiratory: No cyanosis, no use of accessory musculature GI: No organomegaly, abdomen is soft and non-tender Skin: No lesions in the area of chief complaint Neurologic: Sensation intact distally Psychiatric: Patient is competent for consent with normal mood and affect Lymphatic: No axillary or cervical lymphadenopathy  MUSCULOSKELETAL:  Right hip exam: Skin intact about the right hip. No obvious bruising. Right LE shortened and externally rotated. Distal pulses intact.  Sensation intact distally.   Assessment: Right hip displaced intertrochanteric femur fracture  Plan:  I saw Ms. Mckamie in the ED on behalf of Dr. Alvan Dame. We discussed her diagnosis of hip fracture, and plans for surgery. We will plan on right hip ORIF with intramedullary nail this evening with Dr. Alvan Dame. He will review this with her today as well. He did speak with her daughter, Charlene Garza, today regarding surgery. Continue NPO. Continue pain management.    Charlene March, PA-C Cell [phone]   01/27/2020 11:24 AM

## 2020-01-27 NOTE — H&P (View-Only) (Signed)
ORTHOPAEDIC CONSULTATION  REQUESTING PHYSICIAN: Jae Dire, MD  PCP:  Myrlene Broker, MD  Chief Complaint:   HPI: Charlene Garza is a 86 y.o. female who presented to the [facility] ED on 01/26/20 via EMS with complaints of Right hip pain secondary to fall that day. She reports she felt as though the leg gave way, resulting in her falling. She states she was unable to stand. In the ED, x-rays revealed right intertrochanteric femur fracture with varus angulation. [name] was consulted for orthopaedic management.   Today, she reports she is comfortable at rest. She states she lives at home with her daughter, Carollee Herter, and her son in law. She uses a walker at baseline for ambulation. She denies use of blood thinners.   Past Medical History:  Diagnosis Date  . Anemia   . Arthritis   . Hyperlipidemia   . Hypertension   . Osteopenia    T score : - 2.4 @ R femoral neck  . Renal insufficiency    Past Surgical History:  Procedure Laterality Date  . CATARACT EXTRACTION     bilaterally  . CHOLECYSTECTOMY  10/17/2012   Procedure: LAPAROSCOPIC CHOLECYSTECTOMY WITH INTRAOPERATIVE CHOLANGIOGRAM;  Surgeon: Axel Filler, MD;  Location: [facility] OR;  Service: General;  Laterality: N/A;  . KNEE SURGERY  10/2003   L TKR   Social History   Socioeconomic History  . Marital status: Married    Spouse name: Not on file  . Number of children: Not on file  . Years of education: Not on file  . Highest education level: Not on file  Occupational History  . Not on file  Tobacco Use  . Smoking status: Never Smoker  . Smokeless tobacco: Never Used  Substance and Sexual Activity  . Alcohol use: Yes    Alcohol/week: 7.0 standard drinks    Types: 7 Shots of liquor per week  . Drug use: No  . Sexual activity: Not Currently  Other Topics Concern  . Not on file  Social History Narrative  . Not on file   Social Determinants of Health   Financial Resource Strain:   . Difficulty of  Paying Living Expenses: Not on file  Food Insecurity:   . Worried About Programme researcher, broadcasting/film/video in the Last Year: Not on file  . Ran Out of Food in the Last Year: Not on file  Transportation Needs:   . Lack of Transportation (Medical): Not on file  . Lack of Transportation (Non-Medical): Not on file  Physical Activity:   . Days of Exercise per Week: Not on file  . Minutes of Exercise per Session: Not on file  Stress:   . Feeling of Stress : Not on file  Social Connections:   . Frequency of Communication with Friends and Family: Not on file  . Frequency of Social Gatherings with Friends and Family: Not on file  . Attends Religious Services: Not on file  . Active Member of Clubs or Organizations: Not on file  . Attends Banker Meetings: Not on file  . Marital Status: Not on file   Family History  Problem Relation Age of Onset  . Hypertension Father   . Hypertension Mother   . Heart attack Mother 15  . Hypertension Brother   . Diabetes Neg Hx   . Stroke Neg Hx    Allergies  Allergen Reactions  . Ambien Cr [Zolpidem Tartrate Er]     10/2012 mental status changes postoperatively  .  Lovastatin     10/02/13 CK 276   Prior to Admission medications   Medication Sig Start Date End Date Taking? Authorizing Provider  atenolol (TENORMIN) 25 MG tablet Take 1 tablet (25 mg total) by mouth 2 (two) times daily. Need office visit before next refill 09/09/19  Yes Crawford, Elizabeth A, MD  memantine (NAMENDA) 5 MG tablet Take 1 tablet (5 mg total) by mouth daily. 11/28/19  Yes Crawford, Elizabeth A, MD  HYDROcodone-acetaminophen (NORCO/VICODIN) 5-325 MG tablet Take 1-2 tablets by mouth every 6 (six) hours as needed for moderate pain. Patient not taking: Reported on 01/27/2020 01/23/18   Crawford, Elizabeth A, MD   DG Chest 1 View  Result Date: 01/26/2020 CLINICAL DATA:  Fall.  Preop EXAM: CHEST  1 VIEW COMPARISON:  10/14/2012 FINDINGS: Cardiomegaly. Calcified granuloma in the right mid  lung. No confluent opacities or effusions. No acute bony abnormality. IMPRESSION: Cardiomegaly.  No active cardiopulmonary disease. Electronically Signed   By: Kevin  Dover M.D.   On: 01/26/2020 23:09   CT HEAD WO CONTRAST  Result Date: 01/26/2020 CLINICAL DATA:  Fall EXAM: CT HEAD WITHOUT CONTRAST TECHNIQUE: Contiguous axial images were obtained from the base of the skull through the vertex without intravenous contrast. COMPARISON:  None. FINDINGS: Brain: There is atrophy and chronic small vessel disease changes. No acute intracranial abnormality. Specifically, no hemorrhage, hydrocephalus, mass lesion, acute infarction, or significant intracranial injury. Vascular: No hyperdense vessel or unexpected calcification. Skull: No acute calvarial abnormality. Sinuses/Orbits: Visualized paranasal sinuses and mastoids clear. Orbital soft tissues unremarkable. Other: None IMPRESSION: Atrophy, chronic microvascular disease. No acute intracranial abnormality. Electronically Signed   By: Kevin  Dover M.D.   On: 01/26/2020 23:36   CT CERVICAL SPINE WO CONTRAST  Result Date: 01/26/2020 CLINICAL DATA:  Fall EXAM: CT CERVICAL SPINE WITHOUT CONTRAST TECHNIQUE: Multidetector CT imaging of the cervical spine was performed without intravenous contrast. Multiplanar CT image reconstructions were also generated. COMPARISON:  None. FINDINGS: Alignment: No subluxation. Skull base and vertebrae: No acute fracture. No primary bone lesion or focal pathologic process. Soft tissues and spinal canal: No prevertebral fluid or swelling. No visible canal hematoma. Disc levels: Advanced diffuse degenerative facet disease and moderate degenerative disc disease. Upper chest: No acute findings Other: Densely calcified carotid arteries bilaterally. IMPRESSION: Degenerative disc and facet disease. No acute bony abnormality. Electronically Signed   By: Kevin  Dover M.D.   On: 01/26/2020 23:38   DG Hip Unilat With Pelvis 2-3 Views Right  Result  Date: 01/26/2020 CLINICAL DATA:  Fall, right hip pain EXAM: DG HIP (WITH OR WITHOUT PELVIS) 2-3V RIGHT COMPARISON:  None. FINDINGS: There is a right femoral intertrochanteric fracture with displacement and varus angulation. No subluxation or dislocation. No additional acute bony abnormality. IMPRESSION: Right intertrochanteric fracture with varus angulation. Electronically Signed   By: Kevin  Dover M.D.   On: 01/26/2020 23:11    Positive ROS: All other systems have been reviewed and were otherwise negative with the exception of those mentioned in the HPI and as above.  Physical Exam: General: Alert, no acute distress Cardiovascular: No pedal edema Respiratory: No cyanosis, no use of accessory musculature GI: No organomegaly, abdomen is soft and non-tender Skin: No lesions in the area of chief complaint Neurologic: Sensation intact distally Psychiatric: Patient is competent for consent with normal mood and affect Lymphatic: No axillary or cervical lymphadenopathy  MUSCULOSKELETAL:  Right hip exam: Skin intact about the right hip. No obvious bruising. Right LE shortened and externally rotated. Distal pulses intact.   Sensation intact distally.   Assessment: Right hip displaced intertrochanteric femur fracture  Plan:  I saw Ms. Mckamie in the ED on behalf of Dr. Alvan Dame. We discussed her diagnosis of hip fracture, and plans for surgery. We will plan on right hip ORIF with intramedullary nail this evening with Dr. Alvan Dame. He will review this with her today as well. He did speak with her daughter, Larene Beach, today regarding surgery. Continue NPO. Continue pain management.    Maurice March, PA-C Cell [phone]   01/27/2020 11:24 AM

## 2020-01-27 NOTE — Progress Notes (Signed)
This is an 86 year old female with past medical history of dementia, anemia, arthritis, hypertension, hyperlipidemia, CKD 3 who presented to the ED status post mechanical fall and was admitted early on 2/9 with right intertrochanteric fracture with varus angulation on x-ray right hip and found to have BP 225/108 on presentation.  COVID-19 negative.  BP elevated thought to be from pain related to fracture and improved with antihypertensives.  Evaluated by [name], orthopedic surgery, on 01/27/2020 with plans for right hip ORIF with intramedullary nail same evening.  General: Awake and alert in no acute distress Heart: Regular rate and rhythm without murmurs Lungs: CTA bilaterally MSK: Right hip rotated outward with mild tenderness to palpation.  Pedal pulses palpated bilaterally  A/P - plans for right hip ORIF with intramedullary nail this evening with Dr. Constance Goltz -Continue pain management per Ortho -PT eval in a.m.  Whitney Post, DO

## 2020-01-27 NOTE — H&P (Signed)
History and Physical    Charlene Garza GLO:[id number] DOB: 06-06-1930 DOA: 01/26/2020  PCP: Myrlene Broker, MD Patient coming from: Home  Chief Complaint: Fall  HPI: Charlene Garza is a 86 y.o. female with medical history significant of dementia, anemia, arthritis, hypertension, hyperlipidemia, osteopenia, CKD stage III presenting to the ED after a mechanical fall.  Patient states she was brought to the hospital after having a fall.  She thinks she might have slipped but does not remember what exactly happened.  No other complaints.  Denies striking her head.  Denies headaches, neck pain, back pain, or hip pain.  Denies fevers, chills, chest pain, shortness of breath, cough, nausea, vomiting, abdominal pain, diarrhea, or dysuria.  ED Course: Afebrile.  Not tachycardic.  Blood pressure significantly elevated (225/108) on arrival.  Not hypoxic.  Labs showing no leukocytosis.  Hemoglobin 11.3, no significant change from prior labs.  Creatinine 1.3, no significant change from baseline.  SARS-CoV-2 PCR test negative. Chest x-ray showing no active cardiopulmonary disease. X-ray showing right intertrochanteric fracture with varus angulation. CT head negative for acute intracranial abnormality. CT C-spine negative for acute bony abnormality. 1 L LR bolus ordered. ED provider discussed the case with [name] from orthopedics, will consult in a.m.  Review of Systems:  All systems reviewed and apart from history of presenting illness, are negative.  Past Medical History:  Diagnosis Date  . Anemia   . Arthritis   . Hyperlipidemia   . Hypertension   . Osteopenia    T score : - 2.4 @ R femoral neck  . Renal insufficiency     Past Surgical History:  Procedure Laterality Date  . CATARACT EXTRACTION     bilaterally  . CHOLECYSTECTOMY  10/17/2012   Procedure: LAPAROSCOPIC CHOLECYSTECTOMY WITH INTRAOPERATIVE CHOLANGIOGRAM;  Surgeon: Axel Filler, MD;  Location: [facility] OR;  Service: General;   Laterality: N/A;  . KNEE SURGERY  10/2003   L TKR     reports that she has never smoked. She has never used smokeless tobacco. She reports current alcohol use of about 7.0 standard drinks of alcohol per week. She reports that she does not use drugs.  Allergies  Allergen Reactions  . Ambien Cr [Zolpidem Tartrate Er]     10/2012 mental status changes postoperatively  . Lovastatin     10/02/13 CK 276    Family History  Problem Relation Age of Onset  . Hypertension Father   . Hypertension Mother   . Heart attack Mother 46  . Hypertension Brother   . Diabetes Neg Hx   . Stroke Neg Hx     Prior to Admission medications   Medication Sig Start Date End Date Taking? Authorizing Provider  atenolol (TENORMIN) 25 MG tablet Take 1 tablet (25 mg total) by mouth 2 (two) times daily. Need office visit before next refill 09/09/19  Yes Myrlene Broker, MD  memantine (NAMENDA) 5 MG tablet Take 1 tablet (5 mg total) by mouth daily. 11/28/19  Yes Myrlene Broker, MD  HYDROcodone-acetaminophen (NORCO/VICODIN) 5-325 MG tablet Take 1-2 tablets by mouth every 6 (six) hours as needed for moderate pain. Patient not taking: Reported on 01/27/2020 01/23/18   Myrlene Broker, MD    Physical Exam: Vitals:   01/27/20 0100 01/27/20 0109 01/27/20 0123 01/27/20 0217  BP: (!) 192/112 (!) 192/112 (!) 182/89 132/78  Pulse:  70 62 64  Resp: 17 14 20 18   Temp:      TempSrc:      SpO2:  100% 100% 98%    Physical Exam  Constitutional: No distress.  HENT:  Head: Normocephalic.  Eyes: Right eye exhibits no discharge. Left eye exhibits no discharge.  Cardiovascular: Normal rate, regular rhythm and intact distal pulses.  Pulmonary/Chest: Effort normal and breath sounds normal. No respiratory distress. She has no wheezes. She has no rales.  Abdominal: Soft. Bowel sounds are normal. She exhibits no distension. There is no abdominal tenderness. There is no guarding.  Musculoskeletal:        General:  Edema present.     Cervical back: Neck supple.     Comments: Right lower extremity shortened and externally rotated +2 pedal edema  Neurological:  Awake and alert Oriented to self only  Skin: Skin is warm and dry. She is not diaphoretic.     Labs on Admission: I have personally reviewed following labs and imaging studies  CBC: Recent Labs  Lab 01/27/20 0005  WBC 6.9  NEUTROABS 5.8  HGB 11.3*  HCT 33.0*  MCV 95.4  PLT 856   Basic Metabolic Panel: Recent Labs  Lab 01/27/20 0005  NA 134*  K 4.7  CL 101  CO2 22  GLUCOSE 118*  BUN 47*  CREATININE 1.39*  CALCIUM 9.3   GFR: CrCl cannot be calculated (Unknown ideal weight.). Liver Function Tests: No results for input(s): AST, ALT, ALKPHOS, BILITOT, PROT, ALBUMIN in the last 168 hours. No results for input(s): LIPASE, AMYLASE in the last 168 hours. No results for input(s): AMMONIA in the last 168 hours. Coagulation Profile: Recent Labs  Lab 01/27/20 0005  INR 1.0   Cardiac Enzymes: No results for input(s): CKTOTAL, CKMB, CKMBINDEX, TROPONINI in the last 168 hours. BNP (last 3 results) No results for input(s): PROBNP in the last 8760 hours. HbA1C: No results for input(s): HGBA1C in the last 72 hours. CBG: No results for input(s): GLUCAP in the last 168 hours. Lipid Profile: No results for input(s): CHOL, HDL, LDLCALC, TRIG, CHOLHDL, LDLDIRECT in the last 72 hours. Thyroid Function Tests: No results for input(s): TSH, T4TOTAL, FREET4, T3FREE, THYROIDAB in the last 72 hours. Anemia Panel: No results for input(s): VITAMINB12, FOLATE, FERRITIN, TIBC, IRON, RETICCTPCT in the last 72 hours. Urine analysis:    Component Value Date/Time   COLORURINE YELLOW 10/18/2012 2032   APPEARANCEUR CLEAR 10/18/2012 2032   LABSPEC 1.015 10/18/2012 2032   PHURINE 5.5 10/18/2012 2032   GLUCOSEU NEGATIVE 10/18/2012 2032   HGBUR NEGATIVE 10/18/2012 2032   BILIRUBINUR NEGATIVE 10/18/2012 2032   KETONESUR NEGATIVE 10/18/2012 2032     PROTEINUR NEGATIVE 10/18/2012 2032   UROBILINOGEN 0.2 10/18/2012 2032   NITRITE NEGATIVE 10/18/2012 2032   LEUKOCYTESUR NEGATIVE 10/18/2012 2032    Radiological Exams on Admission: DG Chest 1 View  Result Date: 01/26/2020 CLINICAL DATA:  Fall.  Preop EXAM: CHEST  1 VIEW COMPARISON:  10/14/2012 FINDINGS: Cardiomegaly. Calcified granuloma in the right mid lung. No confluent opacities or effusions. No acute bony abnormality. IMPRESSION: Cardiomegaly.  No active cardiopulmonary disease. Electronically Signed   By: Rolm Baptise M.D.   On: 01/26/2020 23:09   CT HEAD WO CONTRAST  Result Date: 01/26/2020 CLINICAL DATA:  Fall EXAM: CT HEAD WITHOUT CONTRAST TECHNIQUE: Contiguous axial images were obtained from the base of the skull through the vertex without intravenous contrast. COMPARISON:  None. FINDINGS: Brain: There is atrophy and chronic small vessel disease changes. No acute intracranial abnormality. Specifically, no hemorrhage, hydrocephalus, mass lesion, acute infarction, or significant intracranial injury. Vascular: No hyperdense vessel or unexpected calcification.  Skull: No acute calvarial abnormality. Sinuses/Orbits: Visualized paranasal sinuses and mastoids clear. Orbital soft tissues unremarkable. Other: None IMPRESSION: Atrophy, chronic microvascular disease. No acute intracranial abnormality. Electronically Signed   By: Charlett Nose M.D.   On: 01/26/2020 23:36   CT CERVICAL SPINE WO CONTRAST  Result Date: 01/26/2020 CLINICAL DATA:  Fall EXAM: CT CERVICAL SPINE WITHOUT CONTRAST TECHNIQUE: Multidetector CT imaging of the cervical spine was performed without intravenous contrast. Multiplanar CT image reconstructions were also generated. COMPARISON:  None. FINDINGS: Alignment: No subluxation. Skull base and vertebrae: No acute fracture. No primary bone lesion or focal pathologic process. Soft tissues and spinal canal: No prevertebral fluid or swelling. No visible canal hematoma. Disc levels:  Advanced diffuse degenerative facet disease and moderate degenerative disc disease. Upper chest: No acute findings Other: Densely calcified carotid arteries bilaterally. IMPRESSION: Degenerative disc and facet disease. No acute bony abnormality. Electronically Signed   By: Charlett Nose M.D.   On: 01/26/2020 23:38   DG Hip Unilat With Pelvis 2-3 Views Right  Result Date: 01/26/2020 CLINICAL DATA:  Fall, right hip pain EXAM: DG HIP (WITH OR WITHOUT PELVIS) 2-3V RIGHT COMPARISON:  None. FINDINGS: There is a right femoral intertrochanteric fracture with displacement and varus angulation. No subluxation or dislocation. No additional acute bony abnormality. IMPRESSION: Right intertrochanteric fracture with varus angulation. Electronically Signed   By: Charlett Nose M.D.   On: 01/26/2020 23:11    EKG: Independently reviewed.  Irregular rhythm, P waves visible.  Assessment/Plan Principal Problem:   Hip fracture (HCC) Active Problems:   Essential hypertension   Dementia (HCC)   CKD (chronic kidney disease) stage 3, GFR 30-59 ml/min   Right hip fracture secondary to a suspected mechanical fall X-ray showing right intertrochanteric fracture with varus angulation. -Keep n.p.o. Gentle IV fluid hydration. -Pain management: Morphine as needed, Norco as needed -Bowel regimen -Nonweightbearing -Orthopedics will consult in a.m.  Hypertension Blood pressure was significantly elevated upon arrival to the ED, ? related to pain.  Systolic currently in 130s without receiving any antihypertensive medications. -Continue home atenolol -Hydralazine as needed  Dementia -Continue home Aricept  CKD stage III -Creatinine 1.3, no significant change from baseline.  Continue to monitor renal function.  Irregular rhythm Cardiac monitoring showing irregular rhythm with normal rate.  Does not appear to be A. fib as P waves are visible.  No prior documented history of A. Fib. -Cardiac monitoring -Continue to monitor     DVT prophylaxis: SCDs at this time Code Status: Full code Family Communication: No family available at this time. Disposition Plan: Anticipate discharge after surgery. Consults called: Orthopedics Admission status: It is my clinical opinion that admission to INPATIENT is reasonable and necessary in this 86 y.o. female . presenting with right hip fracture secondary to a suspected mechanical fall.  Needs surgery in a.m.  Given the aforementioned, the predictability of an adverse outcome is felt to be significant. I expect that the patient will require at least 2 midnights in the hospital to treat this condition.   The medical decision making on this patient was of high complexity and the patient is at high risk for clinical deterioration, therefore this is a level 3 visit.  John Giovanni MD [facility]  If 7PM-7AM, please contact night-coverage [email or web address] Password [email or web address]  01/27/2020, 2:24 AM

## 2020-01-27 NOTE — ED Notes (Signed)
Arm cleaned with saline 3 skin tears to right arm.  Wrapped after cleaning.

## 2020-01-27 NOTE — ED Notes (Signed)
Spoke to daughter. Daughter requesting a update when pt has room.

## 2020-01-27 NOTE — ED Notes (Signed)
Doppler used to hear pedal pluses. Left leg elevated off of bed. Pt able to move toes bilaterally. Pt has equal sensation bilaterally.  Pitting edema bilaterally. Right leg +4.  Left +2.  Legs warm to touch feet are cool to the.

## 2020-01-27 NOTE — Anesthesia Preprocedure Evaluation (Signed)
Anesthesia Evaluation  Patient identified by MRN, date of birth, ID band Patient awake    Reviewed: Allergy & Precautions, NPO status , Patient's Chart, lab work & pertinent test results  Airway Mallampati: II  TM Distance: >3 FB Neck ROM: Full    Dental no notable dental hx.    Pulmonary neg pulmonary ROS,    Pulmonary exam normal breath sounds clear to auscultation       Cardiovascular hypertension, Pt. on medications and Pt. on home beta blockers Normal cardiovascular exam Rhythm:Regular Rate:Normal     Neuro/Psych negative neurological ROS  negative psych ROS   GI/Hepatic negative GI ROS, Neg liver ROS,   Endo/Other  negative endocrine ROS  Renal/GU Renal InsufficiencyRenal disease  negative genitourinary   Musculoskeletal negative musculoskeletal ROS (+)   Abdominal   Peds negative pediatric ROS (+)  Hematology  (+) anemia ,   Anesthesia Other Findings   Reproductive/Obstetrics negative OB ROS                             Anesthesia Physical Anesthesia Plan  ASA: III  Anesthesia Plan: General   Post-op Pain Management:    Induction: Intravenous  PONV Risk Score and Plan: 3 and Ondansetron, Dexamethasone and Treatment may vary due to age or medical condition  Airway Management Planned: Oral ETT  Additional Equipment:   Intra-op Plan:   Post-operative Plan: Extubation in OR  Informed Consent: I have reviewed the patients History and Physical, chart, labs and discussed the procedure including the risks, benefits and alternatives for the proposed anesthesia with the patient or authorized representative who has indicated his/her understanding and acceptance.     Dental advisory given  Plan Discussed with: CRNA and Surgeon  Anesthesia Plan Comments:         Anesthesia Quick Evaluation

## 2020-01-27 NOTE — ED Notes (Signed)
Daughter at bedside due to forgetfulness and pt disoriented to time and location at this time. Pt does not recall falling and was tearful during communication. Pt denies pain at this time. Daughter able to calm pt and daughter reports some memory issues.

## 2020-01-28 ENCOUNTER — Encounter: Payer: Self-pay | Admitting: *Deleted

## 2020-01-28 LAB — BASIC METABOLIC PANEL
Anion gap: 8 (ref 5–15)
BUN: 32 mg/dL — ABNORMAL HIGH (ref 8–23)
CO2: 20 mmol/L — ABNORMAL LOW (ref 22–32)
Calcium: 8.4 mg/dL — ABNORMAL LOW (ref 8.9–10.3)
Chloride: 104 mmol/L (ref 98–111)
Creatinine, Ser: 1.1 mg/dL — ABNORMAL HIGH (ref 0.44–1.00)
GFR calc Af Amer: 52 mL/min — ABNORMAL LOW (ref >60)
GFR calc non Af Amer: 45 mL/min — ABNORMAL LOW (ref >60)
Glucose, Bld: 141 mg/dL — ABNORMAL HIGH (ref 70–99)
Potassium: 4.6 mmol/L (ref 3.5–5.1)
Sodium: 132 mmol/L — ABNORMAL LOW (ref 135–145)

## 2020-01-28 LAB — CBC
HCT: 23.7 % — ABNORMAL LOW (ref 36.0–46.0)
Hemoglobin: 7.7 g/dL — ABNORMAL LOW (ref 12.0–15.0)
MCH: 31.7 pg (ref 26.0–34.0)
MCHC: 32.5 g/dL (ref 30.0–36.0)
MCV: 97.5 fL (ref 80.0–100.0)
Platelets: 49 10*3/uL — ABNORMAL LOW (ref 150–400)
RBC: 2.43 MIL/uL — ABNORMAL LOW (ref 3.87–5.11)
RDW: 13.2 % (ref 11.5–15.5)
WBC: 5.6 10*3/uL (ref 4.0–10.5)
nRBC: 0 % (ref 0.0–0.2)

## 2020-01-28 MED ORDER — FENTANYL CITRATE (PF) 250 MCG/5ML IJ SOLN
INTRAMUSCULAR | Status: AC
  Filled 2020-01-28: qty 5

## 2020-01-28 MED ORDER — ACETAMINOPHEN 325 MG PO TABS
650.0000 mg | ORAL_TABLET | Freq: Three times a day (TID) | ORAL | Status: DC
  Administered 2020-01-28 – 2020-01-30 (×7): 650 mg via ORAL
  Administered 2020-01-31: 325 mg via ORAL
  Administered 2020-01-31 – 2020-02-01 (×3): 650 mg via ORAL
  Filled 2020-01-28 (×11): qty 2

## 2020-01-28 NOTE — Progress Notes (Signed)
PROGRESS NOTE    Charlene Garza  IHK:[id number] DOB: Mar 10, 1930 DOA: 01/26/2020 PCP: Myrlene Broker, MD    Brief Narrative:86 y.o. female with medical history significant of dementia, anemia, arthritis, hypertension, hyperlipidemia, osteopenia, CKD stage III presenting to the ED after a mechanical fall.  Patient states she was brought to the hospital after having a fall.  She thinks she might have slipped but does not remember what exactly happened.  No other complaints.  Denies striking her head.  Denies headaches, neck pain, back pain, or hip pain.  Denies fevers, chills, chest pain, shortness of breath, cough, nausea, vomiting, abdominal pain, diarrhea, or dysuria.  ED Course: Afebrile.  Not tachycardic.  Blood pressure significantly elevated (225/108) on arrival.  Not hypoxic.  Labs showing no leukocytosis.  Hemoglobin 11.3, no significant change from prior labs.  Creatinine 1.3, no significant change from baseline.  SARS-CoV-2 PCR test negative. Chest x-ray showing no active cardiopulmonary disease. X-ray showing right intertrochanteric fracture with varus angulation. CT head negative for acute intracranial abnormality. CT C-spine negative for acute bony abnormality. 1 L LR bolus ordered. ED provider discussed the case with [name] from orthopedics, will consult in a.m.  Assessment & Plan:   Principal Problem:   Hip fracture (HCC) Active Problems:   Essential hypertension   Dementia (HCC)   CKD (chronic kidney disease) stage 3, GFR 30-59 ml/min    #1 right hip fracture status post mechanical fall status post ORIF 01/28/2020.  Seen by PT today. Recommending SNF versus 24-hour home with home health Partial weightbearing to the right lower extremity Follow-up with Ortho in 2 weeks Pain control with Tylenol   #2 essential hypertension -blood pressure 131/66 continue atenolol 25 mg twice a day.  #3 history of dementia on Namenda  #4 stage III CKD stable   Estimated  body mass index is 17.89 kg/m as calculated from the following:   Height as of 09/05/19: 5\' 3"  (1.6 m).   Weight as of 09/05/19: 45.8 kg.  DVT prophylaxis: Aspirin Code Status: Full code  family Communication: Called patient's daughter with no response Disposition Plan: Patient admitted from home.  Plan is home versus SNF.  PT recommended SNF or 24-hour home with home health.  Barriers to discharge none -we will attempt to call the daughter again today.   Consultants:   Ortho  Procedures: Right hip ORIF Antimicrobials: None  Subjective: Patient is resting in bed she is awake and alert mild confusion but answers my questions appropriately wants to watch news in TV wants me to change her channel  Objective: Vitals:   01/28/20 0000 01/28/20 0419 01/28/20 0956 01/28/20 1349  BP: 117/76 138/76 (!) 115/46 131/66  Pulse: (!) 54 (!) 59 (!) 53 (!) 52  Resp: 16 16 18 18   Temp: 98.2 F (36.8 C) 98.6 F (37 C) 98.4 F (36.9 C) 98.1 F (36.7 C)  TempSrc: Axillary Axillary Oral Oral  SpO2: 92% 92%  (!) 81%    Intake/Output Summary (Last 24 hours) at 01/28/2020 1532 Last data filed at 01/28/2020 1400 Gross per 24 hour  Intake 1600 ml  Output 550 ml  Net 1050 ml   There were no vitals filed for this visit.  Examination:  General exam: Appears calm and comfortable  Respiratory system: Clear to auscultation. Respiratory effort normal. Cardiovascular system: S1 & S2 heard, RRR. No JVD, murmurs, rubs, gallops or clicks. No pedal edema. Gastrointestinal system: Abdomen is nondistended, soft and nontender. No organomegaly or masses felt. Normal bowel  sounds heard. Central nervous system: Alert and oriented. No focal neurological deficits. Extremities: Right hip incision dressing clean dry and intact. skin: No rashes, lesions or ulcers Psychiatry: Judgement and insight appear normal. Mood & affect appropriate.     Data Reviewed: I have personally reviewed following labs and imaging  studies  CBC: Recent Labs  Lab 01/27/20 0005 01/28/20 0323  WBC 6.9 5.6  NEUTROABS 5.8  --   HGB 11.3* 7.7*  HCT 33.0* 23.7*  MCV 95.4 97.5  PLT 179 49*   Basic Metabolic Panel: Recent Labs  Lab 01/27/20 0005 01/28/20 0323  NA 134* 132*  K 4.7 4.6  CL 101 104  CO2 22 20*  GLUCOSE 118* 141*  BUN 47* 32*  CREATININE 1.39* 1.10*  CALCIUM 9.3 8.4*   GFR: CrCl cannot be calculated (Unknown ideal weight.). Liver Function Tests: No results for input(s): AST, ALT, ALKPHOS, BILITOT, PROT, ALBUMIN in the last 168 hours. No results for input(s): LIPASE, AMYLASE in the last 168 hours. No results for input(s): AMMONIA in the last 168 hours. Coagulation Profile: Recent Labs  Lab 01/27/20 0005  INR 1.0   Cardiac Enzymes: No results for input(s): CKTOTAL, CKMB, CKMBINDEX, TROPONINI in the last 168 hours. BNP (last 3 results) No results for input(s): PROBNP in the last 8760 hours. HbA1C: No results for input(s): HGBA1C in the last 72 hours. CBG: Recent Labs  Lab 01/27/20 1028  GLUCAP 96   Lipid Profile: No results for input(s): CHOL, HDL, LDLCALC, TRIG, CHOLHDL, LDLDIRECT in the last 72 hours. Thyroid Function Tests: No results for input(s): TSH, T4TOTAL, FREET4, T3FREE, THYROIDAB in the last 72 hours. Anemia Panel: No results for input(s): VITAMINB12, FOLATE, FERRITIN, TIBC, IRON, RETICCTPCT in the last 72 hours. Sepsis Labs: No results for input(s): PROCALCITON, LATICACIDVEN in the last 168 hours.  Recent Results (from the past 240 hour(s))  Respiratory Panel by RT PCR (Flu A&B, Covid) - Nasopharyngeal Swab     Status: None   Collection Time: 01/26/20 10:52 PM   Specimen: Nasopharyngeal Swab  Result Value Ref Range Status   SARS Coronavirus 2 by RT PCR NEGATIVE NEGATIVE Final    Comment: (NOTE) SARS-CoV-2 target nucleic acids are NOT DETECTED. The SARS-CoV-2 RNA is generally detectable in upper respiratoy specimens during the acute phase of infection. The  lowest concentration of SARS-CoV-2 viral copies this assay can detect is 131 copies/mL. A negative result does not preclude SARS-Cov-2 infection and should not be used as the sole basis for treatment or other patient management decisions. A negative result may occur with  improper specimen collection/handling, submission of specimen other than nasopharyngeal swab, presence of viral mutation(s) within the areas targeted by this assay, and inadequate number of viral copies (<131 copies/mL). A negative result must be combined with clinical observations, patient history, and epidemiological information. The expected result is Negative. Fact Sheet for Patients:  https://www.moore.com/ Fact Sheet for Healthcare Providers:  https://www.young.biz/ This test is not yet ap proved or cleared by the [place] FDA and  has been authorized for detection and/or diagnosis of SARS-CoV-2 by FDA under an Emergency Use Authorization (EUA). This EUA will remain  in effect (meaning this test can be used) for the duration of the COVID-19 declaration under Section 564(b)(1) of the Act, 21 U.S.C. section 360bbb-3(b)(1), unless the authorization is terminated or revoked sooner.    Influenza A by PCR NEGATIVE NEGATIVE Final   Influenza B by PCR NEGATIVE NEGATIVE Final    Comment: (NOTE) The Xpert Xpress  SARS-CoV-2/FLU/RSV assay is intended as an aid in  the diagnosis of influenza from Nasopharyngeal swab specimens and  should not be used as a sole basis for treatment. Nasal washings and  aspirates are unacceptable for Xpert Xpress SARS-CoV-2/FLU/RSV  testing. Fact Sheet for Patients: PinkCheek.be Fact Sheet for Healthcare Providers: GravelBags.it This test is not yet approved or cleared by the [place] FDA and  has been authorized for detection and/or diagnosis of SARS-CoV-2 by  FDA under an Emergency  Use Authorization (EUA). This EUA will remain  in effect (meaning this test can be used) for the duration of the  Covid-19 declaration under Section 564(b)(1) of the Act, 21  U.S.C. section 360bbb-3(b)(1), unless the authorization is  terminated or revoked. Performed at [facility], [place] [address], [place], [place] [address]   Surgical PCR screen     Status: None   Collection Time: 01/27/20 12:44 PM   Specimen: Nasal Mucosa; Nasal Swab  Result Value Ref Range Status   MRSA, PCR NEGATIVE NEGATIVE Final   Staphylococcus aureus NEGATIVE NEGATIVE Final    Comment: (NOTE) The Xpert SA Assay (FDA approved for NASAL specimens in patients 55 years of age and older), is one component of a comprehensive surveillance program. It is not intended to diagnose infection nor to guide or monitor treatment. Performed at [facility], [place] [address], [place], [place] [address]          Radiology Studies: DG Chest 1 View  Result Date: 01/26/2020 CLINICAL DATA:  Fall.  Preop EXAM: CHEST  1 VIEW COMPARISON:  10/14/2012 FINDINGS: Cardiomegaly. Calcified granuloma in the right mid lung. No confluent opacities or effusions. No acute bony abnormality. IMPRESSION: Cardiomegaly.  No active cardiopulmonary disease. Electronically Signed   By: Rolm Baptise M.D.   On: 01/26/2020 23:09   CT HEAD WO CONTRAST  Result Date: 01/26/2020 CLINICAL DATA:  Fall EXAM: CT HEAD WITHOUT CONTRAST TECHNIQUE: Contiguous axial images were obtained from the base of the skull through the vertex without intravenous contrast. COMPARISON:  None. FINDINGS: Brain: There is atrophy and chronic small vessel disease changes. No acute intracranial abnormality. Specifically, no hemorrhage, hydrocephalus, mass lesion, acute infarction, or significant intracranial injury. Vascular: No hyperdense vessel or unexpected calcification. Skull: No acute calvarial abnormality. Sinuses/Orbits: Visualized paranasal  sinuses and mastoids clear. Orbital soft tissues unremarkable. Other: None IMPRESSION: Atrophy, chronic microvascular disease. No acute intracranial abnormality. Electronically Signed   By: Rolm Baptise M.D.   On: 01/26/2020 23:36   CT CERVICAL SPINE WO CONTRAST  Result Date: 01/26/2020 CLINICAL DATA:  Fall EXAM: CT CERVICAL SPINE WITHOUT CONTRAST TECHNIQUE: Multidetector CT imaging of the cervical spine was performed without intravenous contrast. Multiplanar CT image reconstructions were also generated. COMPARISON:  None. FINDINGS: Alignment: No subluxation. Skull base and vertebrae: No acute fracture. No primary bone lesion or focal pathologic process. Soft tissues and spinal canal: No prevertebral fluid or swelling. No visible canal hematoma. Disc levels: Advanced diffuse degenerative facet disease and moderate degenerative disc disease. Upper chest: No acute findings Other: Densely calcified carotid arteries bilaterally. IMPRESSION: Degenerative disc and facet disease. No acute bony abnormality. Electronically Signed   By: Rolm Baptise M.D.   On: 01/26/2020 23:38   DG C-Arm 1-60 Min-No Report  Result Date: 01/27/2020 Fluoroscopy was utilized by the requesting physician.  No radiographic interpretation.   DG HIP OPERATIVE UNILAT WITH PELVIS RIGHT  Result Date: 01/27/2020 CLINICAL DATA:  ORIF right hip EXAM: OPERATIVE right HIP (WITH PELVIS  IF PERFORMED) 3 VIEWS TECHNIQUE: Fluoroscopic spot image(s) were submitted for interpretation post-operatively. COMPARISON:  None. FINDINGS: The patient is status post IM nail fixation of intratrochanteric fracture. Fluoro time 44 seconds IMPRESSION: Intraop views of ORIF right intratrochanteric fracture. Electronically Signed   By: Jonna Clark M.D.   On: 01/27/2020 18:54   DG Hip Unilat With Pelvis 2-3 Views Right  Result Date: 01/26/2020 CLINICAL DATA:  Fall, right hip pain EXAM: DG HIP (WITH OR WITHOUT PELVIS) 2-3V RIGHT COMPARISON:  None. FINDINGS: There is a  right femoral intertrochanteric fracture with displacement and varus angulation. No subluxation or dislocation. No additional acute bony abnormality. IMPRESSION: Right intertrochanteric fracture with varus angulation. Electronically Signed   By: Charlett Nose M.D.   On: 01/26/2020 23:11        Scheduled Meds: . aspirin EC  81 mg Oral BID  . atenolol  25 mg Oral BID  . docusate sodium  100 mg Oral BID  . ferrous sulfate  325 mg Oral TID PC  . memantine  5 mg Oral Daily  . senna  1 tablet Oral BID   Continuous Infusions: . methocarbamol (ROBAXIN) IV       LOS: 1 day     Alwyn Ren, MD 01/28/2020, 3:32 PM

## 2020-01-28 NOTE — Evaluation (Signed)
Physical Therapy Evaluation Patient Details Name: Madelene Kaatz MRN: [id number] DOB: Jan 03, 1930 Today's Date: 01/28/2020   History of Present Illness  85 y.o. female with medical history significant of dementia, anemia, arthritis, hypertension, hyperlipidemia, osteopenia, CKD stage III presenting to the ED after a mechanical fall.  Patient states she was brought to the hospital after having a fall. Dx of R hip fx, s/p ORIF, PWB 50%.  Clinical Impression  Pt admitted with above diagnosis. Max assist for supine to sit. Attempted sit to stand but pt was unable to come to full stand with RW 2* pain in R hip. Max assist for squat pivot transfer to recliner.  Pt currently with functional limitations due to the deficits listed below (see PT Problem List). Pt will benefit from skilled PT to increase their independence and safety with mobility to allow discharge to the venue listed below.       Follow Up Recommendations SNF;Supervision/Assistance - 24 hour;Supervision for mobility/OOB(SNF vs HHPT with 24* assist from family, depending on progress)    Equipment Recommendations  None recommended by PT    Recommendations for Other Services       Precautions / Restrictions Precautions Precautions: Fall Restrictions Weight Bearing Restrictions: Yes RLE Weight Bearing: Partial weight bearing RLE Partial Weight Bearing Percentage or Pounds: 50%      Mobility  Bed Mobility Overal bed mobility: Needs Assistance Bed Mobility: Supine to Sit     Supine to sit: Max assist     General bed mobility comments: assist to raise trunk and pivot hips to edge of bed  Transfers Overall transfer level: Needs assistance Equipment used: Rolling walker (2 wheeled) Transfers: Sit to/from [employer] Sit to Stand: Mod assist   Squat pivot transfers: Max assist     General transfer comment: assist to rise from elevated bed, pt unable to come to full stand 2* buckling RLE so sat back on bed  and performed bear hug SPT to recliner with Max assist (pt 40%)  Ambulation/Gait                Stairs            Wheelchair Mobility    Modified Rankin (Stroke Patients Only)       Balance Overall balance assessment: Needs assistance;History of Falls   Sitting balance-Leahy Scale: Good     Standing balance support: Bilateral upper extremity supported Standing balance-Leahy Scale: Poor                               Pertinent Vitals/Pain Pain Assessment: Faces Faces Pain Scale: Hurts whole lot Pain Location: R hip with movement Pain Descriptors / Indicators: Sore Pain Intervention(s): Limited activity within patient's tolerance;Monitored during session;Premedicated before session;Ice applied    Home Living Family/patient expects to be discharged to:: Private residence Living Arrangements: Alone Available Help at Discharge: Family;Available 24 hours/day   Home Access: Stairs to enter   Entrance Stairs-Number of Steps: 4   Home Equipment: Walker - 4 wheels      Prior Function Level of Independence: Needs assistance   Gait / Transfers Assistance Needed: mod I with rollator  ADL's / Homemaking Assistance Needed: daughter assisted with ADLs and shopping        Hand Dominance        Extremity/Trunk Assessment   Upper Extremity Assessment Upper Extremity Assessment: Overall WFL for tasks assessed    Lower Extremity Assessment Lower Extremity Assessment:  RLE deficits/detail RLE Deficits / Details: knee ext -3/5, hip 2/5 limited by pain RLE Sensation: WNL    Cervical / Trunk Assessment Cervical / Trunk Assessment: Normal  Communication   Communication: No difficulties  Cognition Arousal/Alertness: Awake/alert Behavior During Therapy: WFL for tasks assessed/performed Overall Cognitive Status: History of cognitive impairments - at baseline                                 General Comments: h/o dementia, pt able to  follow commands and oriented to self/location/situation      General Comments      Exercises General Exercises - Lower Extremity Ankle Circles/Pumps: AROM;Both;10 reps;Supine Long Arc Quad: AROM;Right;5 reps;Seated Heel Slides: AAROM;Right;10 reps;Supine Hip ABduction/ADduction: AAROM;Right;10 reps;Supine   Assessment/Plan    PT Assessment Patient needs continued PT services  PT Problem List Decreased strength;Decreased activity tolerance;Decreased balance;Decreased mobility;Decreased knowledge of use of DME;Pain       PT Treatment Interventions DME instruction;Gait training;Functional mobility training;Therapeutic exercise;Therapeutic activities;Patient/family education    PT Goals (Current goals can be found in the Care Plan section)  Acute Rehab PT Goals Patient Stated Goal: DC home, to walk PT Goal Formulation: With patient/family Time For Goal Achievement: 02/11/20 Potential to Achieve Goals: Fair    Frequency Min 4X/week   Barriers to discharge        Co-evaluation               AM-PAC PT "6 Clicks" Mobility  Outcome Measure Help needed turning from your back to your side while in a flat bed without using bedrails?: A Lot Help needed moving from lying on your back to sitting on the side of a flat bed without using bedrails?: A Lot Help needed moving to and from a bed to a chair (including a wheelchair)?: A Lot Help needed standing up from a chair using your arms (e.g., wheelchair or bedside chair)?: A Lot Help needed to walk in hospital room?: Total Help needed climbing 3-5 steps with a railing? : Total 6 Click Score: 10    End of Session Equipment Utilized During Treatment: Gait belt Activity Tolerance: Patient tolerated treatment well Patient left: in chair;with call bell/phone within reach(chair alarm pad under pt, no box in room, RN and NT made aware) Nurse Communication: Mobility status PT Visit Diagnosis: Difficulty in walking, not elsewhere  classified (R26.2);Pain;History of falling (Z91.81);Muscle weakness (generalized) (M62.81) Pain - Right/Left: Right Pain - part of body: Hip    Time: 5102-5852 PT Time Calculation (min) (ACUTE ONLY): 37 min   Charges:   PT Evaluation $PT Eval Moderate Complexity: 1 Mod PT Treatments $Therapeutic Activity: 8-22 mins       Ralene Bathe Kistler PT 01/28/2020  Acute Rehabilitation Services Pager [phone] Office [phone]

## 2020-01-28 NOTE — Progress Notes (Signed)
   Subjective: 1 Day Post-Op Procedure(s) (LRB): INTRAMEDULLARY (IM) NAIL FEMORAL (Right) Patient reports pain as mild.   Patient seen in rounds for [name]. Patient is well, and has had no acute complaints or problems other than discomfort in the right hip. No acute events overnight. Patient states she slept well last night. Voiding without difficulty, positive flatus.  She denies CP, SHOB, N/V, abdominal pain.  We will start therapy today.   Objective: Vital signs in last 24 hours: Temp:  [97.6 F (36.4 C)-98.7 F (37.1 C)] 98.4 F (36.9 C) (02/10 0956) Pulse Rate:  [43-64] 53 (02/10 0956) Resp:  [11-18] 18 (02/10 0956) BP: (99-161)/(46-92) 115/46 (02/10 0956) SpO2:  [92 %-100 %] 92 % (02/10 0419)  Intake/Output from previous day:  Intake/Output Summary (Last 24 hours) at 01/28/2020 1249 Last data filed at 01/28/2020 6659 Gross per 24 hour  Intake 1664.8 ml  Output 450 ml  Net 1214.8 ml     Intake/Output this shift: Total I/O In: -  Out: 100 [Urine:100]  Labs: Recent Labs    01/27/20 0005 01/28/20 0323  HGB 11.3* 7.7*   Recent Labs    01/27/20 0005 01/28/20 0323  WBC 6.9 5.6  RBC 3.46* 2.43*  HCT 33.0* 23.7*  PLT 179 49*   Recent Labs    01/27/20 0005 01/28/20 0323  NA 134* 132*  K 4.7 4.6  CL 101 104  CO2 22 20*  BUN 47* 32*  CREATININE 1.39* 1.10*  GLUCOSE 118* 141*  CALCIUM 9.3 8.4*   Recent Labs    01/27/20 0005  INR 1.0    Exam: General - Patient is Alert and Oriented Extremity - Neurologically intact Sensation intact distally Intact pulses distally Dorsiflexion/Plantar flexion intact Dressing - dressing C/D/I Motor Function - intact, moving foot and toes well on exam.   Past Medical History:  Diagnosis Date  . Anemia   . Arthritis   . Hyperlipidemia   . Hypertension   . Osteopenia    T score : - 2.4 @ R femoral neck  . Renal insufficiency     Assessment/Plan: 1 Day Post-Op Procedure(s) (LRB): INTRAMEDULLARY (IM) NAIL  FEMORAL (Right) Principal Problem:   Hip fracture (HCC) Active Problems:   Essential hypertension   Dementia (HCC)   CKD (chronic kidney disease) stage 3, GFR 30-59 ml/min  Estimated body mass index is 17.89 kg/m as calculated from the following:   Height as of 09/05/19: 5\' 3"  (1.6 m).   Weight as of 09/05/19: 45.8 kg. Advance diet Up with therapy  DVT Prophylaxis - Aspirin PWB to the RLE.    Plan is to go Home after hospital stay. Patient will be ready for discharge from an orthopaedic standpoint when she is meeting goals with PT. She will remain PWB to the RLE until follow up. Leave Aquacel dressings in  Place until follow up with Dr. 09/07/19 in 2 weeks. Discharge per medicine team.   Charlann Boxer, PA-C Orthopedic Surgery [phone] 01/28/2020, 12:49 PM

## 2020-01-28 NOTE — NC FL2 (Signed)
[place] LEVEL OF CARE SCREENING TOOL     IDENTIFICATION  Patient Name: Charlene Garza Birthdate: December 26, 1929 Sex: female Admission Date (Current Location): 01/26/2020  [facility] and [place] Number:  Herbalist and Address:  [facility],  [address], [place]      Provider Number: [id number]  Attending Physician Name and Address:  Georgette Shell, MD  Relative Name and Phone Number:  Demetrius Revel [phone] daughter    Current Level of Care:   Recommended Level of Care: [place] Prior Approval Number:    Date Approved/Denied:   PASRR Number: [id number]  Discharge Plan: SNF    Current Diagnoses: Patient Active Problem List   Diagnosis Date Noted  . Hip fracture ([place]) 01/27/2020  . Dementia ([place]) 01/27/2020  . CKD (chronic kidney disease) stage 3, GFR 30-59 ml/min 01/27/2020  . Memory loss 11/28/2019  . Hearing loss due to cerumen impaction, bilateral 09/05/2019  . Routine general medical examination at a health care facility 01/25/2018  . Osteoarthritis 01/25/2018  . Venous insufficiency 07/17/2017  . Mild renal insufficiency 03/07/2012  . Vitamin D deficiency 04/30/2008  . Hyperlipidemia 04/30/2008  . Essential hypertension 05/09/2007    Orientation RESPIRATION BLADDER Height & Weight     Self, Time, Situation, Place  Normal Continent Weight:   Height:     BEHAVIORAL SYMPTOMS/MOOD NEUROLOGICAL BOWEL NUTRITION STATUS      Continent Diet(Regular)  AMBULATORY STATUS COMMUNICATION OF NEEDS Skin   Extensive Assist Verbally Surgical wounds(Right Hip fracture and surgical wound)                       Personal Care Assistance Level of Assistance  Bathing, Feeding, Dressing Bathing Assistance: Limited assistance Feeding assistance: Independent Dressing Assistance: Limited assistance     Functional Limitations Info  Sight, Hearing, Speech Sight Info: Adequate Hearing Info:  Adequate Speech Info: Adequate    SPECIAL CARE FACTORS FREQUENCY  PT (By licensed PT), OT (By licensed OT)     PT Frequency: Eval and treat OT Frequency: Eval and treat            Contractures Contractures Info: Not present    Additional Factors Info  Code Status, Allergies Code Status Info: FULL Allergies Info: Ambien Cr Zolpidem Tartrate Er, Lovastatin           Current Medications (01/28/2020):  This is the current hospital active medication list Current Facility-Administered Medications  Medication Dose Route Frequency Provider Last Rate Last Admin  . acetaminophen (TYLENOL) tablet 325-650 mg  325-650 mg Oral Q6H PRN Maurice March, PA-C      . acetaminophen (TYLENOL) tablet 650 mg  650 mg Oral TID Georgette Shell, MD      . alum & mag hydroxide-simeth (MAALOX/MYLANTA) 200-200-20 MG/5ML suspension 30 mL  30 mL Oral Q4H PRN Maurice March, PA-C      . aspirin EC tablet 81 mg  81 mg Oral BID Maurice March, PA-C   81 mg at 01/28/20 9528  . atenolol (TENORMIN) tablet 25 mg  25 mg Oral BID Maurice March, [place]   Stopped at 01/28/20 4132  . docusate sodium (COLACE) capsule 100 mg  100 mg Oral BID Maurice March, PA-C   100 mg at 01/28/20 4401  . ferrous sulfate tablet 325 mg  325 mg Oral TID PC Maurice March, PA-C   325 mg at 01/28/20 1217  . hydrALAZINE (APRESOLINE) injection  5 mg  5 mg Intravenous Q4H PRN Tommie Ard, PA-C      . HYDROcodone-acetaminophen (NORCO) 7.5-325 MG per tablet 1-2 tablet  1-2 tablet Oral Q4H PRN Tommie Ard, PA-C      . HYDROcodone-acetaminophen (NORCO/VICODIN) 5-325 MG per tablet 1-2 tablet  1-2 tablet Oral Q4H PRN Tommie Ard, PA-C   1 tablet at 01/27/20 2035  . memantine (NAMENDA) tablet 5 mg  5 mg Oral Daily Tommie Ard, PA-C   5 mg at 01/28/20 7829  . menthol-cetylpyridinium (CEPACOL) lozenge 3 mg  1 lozenge Oral PRN Tommie Ard, PA-C   3 mg at 01/27/20 2034   Or  . phenol (CHLORASEPTIC)  mouth spray 1 spray  1 spray Mouth/Throat PRN Tommie Ard, PA-C      . methocarbamol (ROBAXIN) tablet 500 mg  500 mg Oral Q6H PRN Tommie Ard, PA-C   500 mg at 01/28/20 5621   Or  . methocarbamol (ROBAXIN) 500 mg in dextrose 5 % 50 mL IVPB  500 mg Intravenous Q6H PRN Tommie Ard, PA-C      . metoCLOPramide (REGLAN) tablet 5-10 mg  5-10 mg Oral Q8H PRN Tommie Ard, PA-C       Or  . metoCLOPramide (REGLAN) injection 5-10 mg  5-10 mg Intravenous Q8H PRN Tommie Ard, PA-C      . morphine 2 MG/ML injection 0.5-1 mg  0.5-1 mg Intravenous Q2H PRN Tommie Ard, PA-C      . ondansetron (ZOFRAN) tablet 4 mg  4 mg Oral Q6H PRN Tommie Ard, PA-C   4 mg at 01/27/20 2035   Or  . ondansetron (ZOFRAN) injection 4 mg  4 mg Intravenous Q6H PRN Tommie Ard, PA-C      . polyethylene glycol (MIRALAX / GLYCOLAX) packet 17 g  17 g Oral Daily PRN Tommie Ard, PA-C      . senna (SENOKOT) tablet 8.6 mg  1 tablet Oral BID Tommie Ard, PA-C   8.6 mg at 01/28/20 3086     Discharge Medications: Please see discharge summary for a list of discharge medications.  Relevant Imaging Results:  Relevant Lab Results:   Additional Information SS#[id number]  Geni Bers, RN

## 2020-01-29 ENCOUNTER — Inpatient Hospital Stay (HOSPITAL_COMMUNITY): Payer: Medicare Other

## 2020-01-29 DIAGNOSIS — R9431 Abnormal electrocardiogram [ECG] [EKG]: Secondary | ICD-10-CM

## 2020-01-29 LAB — CBC
HCT: 18.2 % — ABNORMAL LOW (ref 36.0–46.0)
HCT: 27.9 % — ABNORMAL LOW (ref 36.0–46.0)
Hemoglobin: 6.2 g/dL — CL (ref 12.0–15.0)
Hemoglobin: 9 g/dL — ABNORMAL LOW (ref 12.0–15.0)
MCH: 32.8 pg (ref 26.0–34.0)
MCH: 31.1 pg (ref 26.0–34.0)
MCHC: 34.1 g/dL (ref 30.0–36.0)
MCHC: 32.3 g/dL (ref 30.0–36.0)
MCV: 96.3 fL (ref 80.0–100.0)
MCV: 96.5 fL (ref 80.0–100.0)
Platelets: 74 10*3/uL — ABNORMAL LOW (ref 150–400)
Platelets: 67 10*3/uL — ABNORMAL LOW (ref 150–400)
RBC: 1.89 MIL/uL — ABNORMAL LOW (ref 3.87–5.11)
RBC: 2.89 MIL/uL — ABNORMAL LOW (ref 3.87–5.11)
RDW: 13.2 % (ref 11.5–15.5)
RDW: 14.3 % (ref 11.5–15.5)
WBC: 6.2 10*3/uL (ref 4.0–10.5)
WBC: 6.5 10*3/uL (ref 4.0–10.5)
nRBC: 0 % (ref 0.0–0.2)
nRBC: 0.8 % — ABNORMAL HIGH (ref 0.0–0.2)

## 2020-01-29 LAB — IRON AND TIBC
Iron: 48 ug/dL (ref 28–170)
Saturation Ratios: 17 % (ref 10.4–31.8)
TIBC: 291 ug/dL (ref 250–450)
UIBC: 243 ug/dL

## 2020-01-29 LAB — BASIC METABOLIC PANEL
Anion gap: 6 (ref 5–15)
BUN: 40 mg/dL — ABNORMAL HIGH (ref 8–23)
CO2: 23 mmol/L (ref 22–32)
Calcium: 8.2 mg/dL — ABNORMAL LOW (ref 8.9–10.3)
Chloride: 98 mmol/L (ref 98–111)
Creatinine, Ser: 1.25 mg/dL — ABNORMAL HIGH (ref 0.44–1.00)
GFR calc Af Amer: 45 mL/min — ABNORMAL LOW (ref >60)
GFR calc non Af Amer: 39 mL/min — ABNORMAL LOW (ref >60)
Glucose, Bld: 105 mg/dL — ABNORMAL HIGH (ref 70–99)
Potassium: 4.1 mmol/L (ref 3.5–5.1)
Sodium: 127 mmol/L — ABNORMAL LOW (ref 135–145)

## 2020-01-29 LAB — RETICULOCYTES
Immature Retic Fract: 16.3 % — ABNORMAL HIGH (ref 2.3–15.9)
RBC.: 2.93 MIL/uL — ABNORMAL LOW (ref 3.87–5.11)
Retic Count, Absolute: 67.1 10*3/uL (ref 19.0–186.0)
Retic Ct Pct: 2.3 % (ref 0.4–3.1)

## 2020-01-29 LAB — DIC (DISSEMINATED INTRAVASCULAR COAGULATION)PANEL
D-Dimer, Quant: 1.83 ug/mL-FEU — ABNORMAL HIGH (ref 0.00–0.50)
Fibrinogen: 358 mg/dL (ref 210–475)
INR: 1 (ref 0.8–1.2)
Platelets: 74 10*3/uL — ABNORMAL LOW (ref 150–400)
Prothrombin Time: 13.2 seconds (ref 11.4–15.2)
Smear Review: NONE SEEN
aPTT: 36 seconds (ref 24–36)

## 2020-01-29 LAB — FERRITIN: Ferritin: 86 ng/mL (ref 11–307)

## 2020-01-29 LAB — PREPARE RBC (CROSSMATCH)

## 2020-01-29 LAB — VITAMIN B12: Vitamin B-12: 239 pg/mL (ref 180–914)

## 2020-01-29 LAB — OSMOLALITY: Osmolality: 284 mOsm/kg (ref 275–295)

## 2020-01-29 LAB — ECHOCARDIOGRAM COMPLETE

## 2020-01-29 LAB — FOLATE: Folate: 7.7 ng/mL (ref >5.9)

## 2020-01-29 MED ORDER — HYDROCODONE-ACETAMINOPHEN 5-325 MG PO TABS: 1.0000 | ORAL_TABLET | Freq: Four times a day (QID) | ORAL | 0 refills | Status: DC | PRN

## 2020-01-29 MED ORDER — ASPIRIN 81 MG PO TBEC: 81.0000 mg | DELAYED_RELEASE_TABLET | Freq: Two times a day (BID) | ORAL | 0 refills | Status: AC

## 2020-01-29 MED ORDER — BISACODYL 10 MG RE SUPP: 10.0000 mg | Freq: Once | RECTAL | Status: DC

## 2020-01-29 MED ORDER — METHOCARBAMOL 500 MG PO TABS: 500.0000 mg | ORAL_TABLET | Freq: Four times a day (QID) | ORAL | 0 refills | Status: DC | PRN

## 2020-01-29 MED ORDER — FERROUS SULFATE 325 (65 FE) MG PO TABS: 325.0000 mg | ORAL_TABLET | Freq: Three times a day (TID) | ORAL | 0 refills | Status: DC

## 2020-01-29 MED ORDER — SODIUM CHLORIDE 0.9% IV SOLUTION: Freq: Once | INTRAVENOUS | Status: AC

## 2020-01-29 MED ORDER — LORAZEPAM 2 MG/ML IJ SOLN
0.5000 mg | INTRAMUSCULAR | Status: DC | PRN
  Administered 2020-01-29 – 2020-01-30 (×2): 0.5 mg via INTRAVENOUS
  Filled 2020-01-29 (×2): qty 1

## 2020-01-29 NOTE — Progress Notes (Signed)
PROGRESS NOTE    Charlene Garza  UXN:[id number] DOB: 1930-07-19 DOA: 01/26/2020 PCP: Myrlene Broker, MD    Brief Narrative: 86 y.o.femalewith medical history significant ofdementia, anemia, arthritis, hypertension, hyperlipidemia, osteopenia, CKD stage III presenting to the ED after amechanicalfall.Patient states she was brought to the hospital after having a fall. She thinks she might have slipped but does not remember what exactly happened. No other complaints. Denies striking her head. Denies headaches, neck pain, back pain, or hip pain. Denies fevers, chills, chest pain, shortness of breath, cough, nausea, vomiting, abdominal pain, diarrhea, or dysuria.  ED Course:Afebrile. Not tachycardic. Blood pressure significantly elevated (225/108) on arrival. Not hypoxic. Labs showing no leukocytosis. Hemoglobin 11.3, no significant change from prior labs. Creatinine 1.3, no significant change from baseline. SARS-CoV-2 PCR test negative. Chest x-ray showing no active cardiopulmonary disease. X-ray showing right intertrochanteric fracture with varus angulation. CT head negative for acute intracranial abnormality. CT C-spine negative for acute bony abnormality. 1 L LR bolus ordered. ED provider discussed the case with [name] from orthopedics, will consult in a.m.  Assessment & Plan:   Principal Problem:   Hip fracture (HCC) Active Problems:   Essential hypertension   Dementia (HCC)   CKD (chronic kidney disease) stage 3, GFR 30-59 ml/min   #1 right hip fracture status post mechanical fall ORIF 01/28/2020. Seen by PT recommends SNF.  Partial weightbearing to right lower extremity.  Follow-up with Ortho in 2 weeks. Continue Tylenol 3 times a day.  #2 acute anemia her hemoglobin is 6.2 down from 11.3 on admission.  She does not have any active bleeding noted.  FOBT ordered.  2 units of blood transfusion ordered.  #3 essential hypertension blood pressure 138/88  #4  stage III CKD creatinine 1.25 up from 1.10 patient's p.o. intake is very diminished will give her normal saline.  #5 hyponatremia sodium 127 down from 132 yesterday.  Her p.o. intake has been poor.  I will give her normal saline 75 cc an hour for 24 hours and recheck the labs.  #6 hypoxia patient was reported to be hypoxic requiring 8 L of oxygen this morning.  However it was found that the pulse ox machine itself was wrong and she was saturating 2 L 100%.  So  stat chest x-ray that was ordered was canceled.  Estimated body mass index is 17.89 kg/m as calculated from the following:   Height as of 09/05/19: 5\' 3"  (1.6 m).   Weight as of 09/05/19: 45.8 kg.  DVT prophylaxis: Aspirin Code Status: Full code  family Communication: Discussed with daughter disposition Plan: Patient admitted from home.  Plan is DC to SNF seen by physical therapy.  Barrier to discharge finding placement and patient getting blood transfusion for acute anemia currently no evidence of active bleeding other than she is postsurgical.    Consultants:   Ortho  Procedures: Right hip ORIF Antimicrobials: None  Subjective: Patient resting in bed awake Mild confusion which is at baseline She has short-term memory loss  Objective: Vitals:   01/29/20 0604 01/29/20 0605 01/29/20 0845 01/29/20 1035  BP: 132/65 132/65 95/69 121/67  Pulse: 64 64 (!) 55 (!) 55  Resp: 16     Temp: 98 F (36.7 C) 98 F (36.7 C) 98.1 F (36.7 C) 98.3 F (36.8 C)  TempSrc:  Oral Oral Oral  SpO2:  100% 93% (!) 86%    Intake/Output Summary (Last 24 hours) at 01/29/2020 1101 Last data filed at 01/29/2020 0845 Gross per 24 hour  Intake 995 ml  Output 100 ml  Net 895 ml   There were no vitals filed for this visit.  Examination:  General exam: Appears calm and comfortable  Respiratory system: Clear to auscultation. Respiratory effort normal. Cardiovascular system: S1 & S2 heard, RRR. No JVD, murmurs, rubs, gallops or clicks. No pedal  edema. Gastrointestinal system: Abdomen is nondistended, soft and nontender. No organomegaly or masses felt. Normal bowel sounds heard. Central nervous system: Alert and oriented. No focal neurological deficits. Extremities: right hip incision c/d/i Skin: No rashes, lesions or ulcers Psychiatry: Judgement and insight appear normal. Mood & affect appropriate.     Data Reviewed: I have personally reviewed following labs and imaging studies  CBC: Recent Labs  Lab 01/27/20 0005 01/28/20 0323 01/29/20 0309  WBC 6.9 5.6 6.2  NEUTROABS 5.8  --   --   HGB 11.3* 7.7* 6.2*  HCT 33.0* 23.7* 18.2*  MCV 95.4 97.5 96.3  PLT 179 49* 74*   Basic Metabolic Panel: Recent Labs  Lab 01/27/20 0005 01/28/20 0323 01/29/20 0309  NA 134* 132* 127*  K 4.7 4.6 4.1  CL 101 104 98  CO2 22 20* 23  GLUCOSE 118* 141* 105*  BUN 47* 32* 40*  CREATININE 1.39* 1.10* 1.25*  CALCIUM 9.3 8.4* 8.2*   GFR: CrCl cannot be calculated (Unknown ideal weight.). Liver Function Tests: No results for input(s): AST, ALT, ALKPHOS, BILITOT, PROT, ALBUMIN in the last 168 hours. No results for input(s): LIPASE, AMYLASE in the last 168 hours. No results for input(s): AMMONIA in the last 168 hours. Coagulation Profile: Recent Labs  Lab 01/27/20 0005  INR 1.0   Cardiac Enzymes: No results for input(s): CKTOTAL, CKMB, CKMBINDEX, TROPONINI in the last 168 hours. BNP (last 3 results) No results for input(s): PROBNP in the last 8760 hours. HbA1C: No results for input(s): HGBA1C in the last 72 hours. CBG: Recent Labs  Lab 01/27/20 1028  GLUCAP 96   Lipid Profile: No results for input(s): CHOL, HDL, LDLCALC, TRIG, CHOLHDL, LDLDIRECT in the last 72 hours. Thyroid Function Tests: No results for input(s): TSH, T4TOTAL, FREET4, T3FREE, THYROIDAB in the last 72 hours. Anemia Panel: No results for input(s): VITAMINB12, FOLATE, FERRITIN, TIBC, IRON, RETICCTPCT in the last 72 hours. Sepsis Labs: No results for  input(s): PROCALCITON, LATICACIDVEN in the last 168 hours.  Recent Results (from the past 240 hour(s))  Respiratory Panel by RT PCR (Flu A&B, Covid) - Nasopharyngeal Swab     Status: None   Collection Time: 01/26/20 10:52 PM   Specimen: Nasopharyngeal Swab  Result Value Ref Range Status   SARS Coronavirus 2 by RT PCR NEGATIVE NEGATIVE Final    Comment: (NOTE) SARS-CoV-2 target nucleic acids are NOT DETECTED. The SARS-CoV-2 RNA is generally detectable in upper respiratoy specimens during the acute phase of infection. The lowest concentration of SARS-CoV-2 viral copies this assay can detect is 131 copies/mL. A negative result does not preclude SARS-Cov-2 infection and should not be used as the sole basis for treatment or other patient management decisions. A negative result may occur with  improper specimen collection/handling, submission of specimen other than nasopharyngeal swab, presence of viral mutation(s) within the areas targeted by this assay, and inadequate number of viral copies (<131 copies/mL). A negative result must be combined with clinical observations, patient history, and epidemiological information. The expected result is Negative. Fact Sheet for Patients:  PinkCheek.be Fact Sheet for Healthcare Providers:  GravelBags.it This test is not yet ap proved or cleared by the  [place] [employer] and  has been authorized for detection and/or diagnosis of SARS-CoV-2 by FDA under an TEFL teacher (EUA). This EUA will remain  in effect (meaning this test can be used) for the duration of the COVID-19 declaration under Section 564(b)(1) of the Act, 21 U.S.C. section 360bbb-3(b)(1), unless the authorization is terminated or revoked sooner.    Influenza A by PCR NEGATIVE NEGATIVE Final   Influenza B by PCR NEGATIVE NEGATIVE Final    Comment: (NOTE) The Xpert Xpress SARS-CoV-2/FLU/RSV assay is intended as an aid  in  the diagnosis of influenza from Nasopharyngeal swab specimens and  should not be used as a sole basis for treatment. Nasal washings and  aspirates are unacceptable for Xpert Xpress SARS-CoV-2/FLU/RSV  testing. Fact Sheet for Patients: https://www.moore.com/ Fact Sheet for Healthcare Providers: https://www.young.biz/ This test is not yet approved or cleared by the [place] FDA and  has been authorized for detection and/or diagnosis of SARS-CoV-2 by  FDA under an Emergency Use Authorization (EUA). This EUA will remain  in effect (meaning this test can be used) for the duration of the  Covid-19 declaration under Section 564(b)(1) of the Act, 21  U.S.C. section 360bbb-3(b)(1), unless the authorization is  terminated or revoked. Performed at [facility], [address] [place], [place] [address]   Surgical PCR screen     Status: None   Collection Time: 01/27/20 12:44 PM   Specimen: Nasal Mucosa; Nasal Swab  Result Value Ref Range Status   MRSA, PCR NEGATIVE NEGATIVE Final   Staphylococcus aureus NEGATIVE NEGATIVE Final    Comment: (NOTE) The Xpert SA Assay (FDA approved for NASAL specimens in patients 61 years of age and older), is one component of a comprehensive surveillance program. It is not intended to diagnose infection nor to guide or monitor treatment. Performed at [facility], [address] [place], [place] [address]          Radiology Studies: DG C-Arm 1-60 Min-No Report  Result Date: 01/27/2020 Fluoroscopy was utilized by the requesting physician.  No radiographic interpretation.   DG HIP OPERATIVE UNILAT WITH PELVIS RIGHT  Result Date: 01/27/2020 CLINICAL DATA:  ORIF right hip EXAM: OPERATIVE right HIP (WITH PELVIS IF PERFORMED) 3 VIEWS TECHNIQUE: Fluoroscopic spot image(s) were submitted for interpretation post-operatively. COMPARISON:  None. FINDINGS: The patient is status  post IM nail fixation of intratrochanteric fracture. Fluoro time 44 seconds IMPRESSION: Intraop views of ORIF right intratrochanteric fracture. Electronically Signed   By: Jonna Clark M.D.   On: 01/27/2020 18:54        Scheduled Meds: . acetaminophen  650 mg Oral TID  . bisacodyl  10 mg Rectal Once  . docusate sodium  100 mg Oral BID  . ferrous sulfate  325 mg Oral TID PC  . memantine  5 mg Oral Daily  . senna  1 tablet Oral BID   Continuous Infusions: . methocarbamol (ROBAXIN) IV       LOS: 2 days     Alwyn Ren, MD 01/29/2020, 11:01 AM

## 2020-01-29 NOTE — Progress Notes (Signed)
CRITICAL VALUE ALERT  Critical Value:  HGB 6.2  Date & Time Notied:  01/29/2020 @ 0353  Provider Notified: Katherina Right APP Orders Received/Actions taken: none yet

## 2020-01-29 NOTE — Progress Notes (Signed)
PHYSICAL THERAPY  Pt's HgB of 6.2 is below therapeutic parameters for mobility.  Will hold off this morning and see this afternoon ideally after 2 units.  Felecia Shelling  PTA Acute  Rehabilitation Services Pager      [phone] Office      [phone]

## 2020-01-29 NOTE — Progress Notes (Signed)
Physical Therapy Treatment Patient Details Name: Charlene Garza MRN: [id number] DOB: 16-Sep-1930 Today's Date: 01/29/2020    History of Present Illness 86 y.o. female with medical history significant of dementia, anemia, arthritis, hypertension, hyperlipidemia, osteopenia, CKD stage III presenting to the ED after a mechanical fall.  Patient states she was brought to the hospital after having a fall. Dx of R hip fx, s/p ORIF, PWB 50%.    PT Comments    POD # 2 am session withheld for blood transfusion HgB was 6.2 Seen in pm after 2 unites.  Daughter present during session.  Assisted OOB to [facility] then up to amb. General bed mobility comments: assist to raise trunk and pivot hips to edge of bed using bed pad.   General transfer comment: 50% VC's on proper hand placement and complete 1/4 pivot from elevated bed to [facility] + 2 assist for safety.  + 2 side by side assist from [facility] to partial upright to amb. General Gait Details: limited amb distance of 3 feet + 2 side by side assist attempting 50% WB very short steps and unsteady.   Pt will need ST Rehab at SNF prior to safely D/C back home.    Follow Up Recommendations  SNF     Equipment Recommendations  None recommended by PT    Recommendations for Other Services       Precautions / Restrictions Precautions Precautions: Fall Restrictions Weight Bearing Restrictions: Yes RLE Weight Bearing: Partial weight bearing RLE Partial Weight Bearing Percentage or Pounds: 50% Other Position/Activity Restrictions: educated pt and family on 50% WB    Mobility  Bed Mobility Overal bed mobility: Needs Assistance Bed Mobility: Supine to Sit     Supine to sit: Max assist     General bed mobility comments: assist to raise trunk and pivot hips to edge of bed using bed pad  Transfers Overall transfer level: Needs assistance Equipment used: Rolling walker (2 wheeled) Transfers: Sit to/from [employer] Sit to Stand: Max assist   Squat  pivot transfers: Max assist     General transfer comment: 50% VC's on proper hand placement and complete 1/4 pivot from elevated bed to [facility] + 2 assist for safety.  + 2 side by side assist from [facility] to partial upright to amb.  Ambulation/Gait Ambulation/Gait assistance: Max assist;+2 physical assistance;+2 safety/equipment Gait Distance (Feet): 3 Feet Assistive device: Rolling walker (2 wheeled) Gait Pattern/deviations: Step-to pattern;Decreased stance time - right;Decreased step length - right;Decreased step length - left Gait velocity: decreased   General Gait Details: limited amb distance of 3 feet + 2 side by side assist attempting 50% WB very short steps and unsteady   Stairs             Wheelchair Mobility    Modified Rankin (Stroke Patients Only)       Balance                                            Cognition Arousal/Alertness: Awake/alert Behavior During Therapy: WFL for tasks assessed/performed Overall Cognitive Status: History of cognitive impairments - at baseline                                 General Comments: h/o dementia, pt able to follow commands and oriented to self/location/situation.  Pleasant  Exercises      General Comments        Pertinent Vitals/Pain Pain Assessment: Faces Faces Pain Scale: Hurts little more Pain Location: R hip Pain Descriptors / Indicators: Discomfort;Operative site guarding Pain Intervention(s): Monitored during session;Repositioned;Premedicated before session    Home Living                      Prior Function            PT Goals (current goals can now be found in the care plan section) Progress towards PT goals: Progressing toward goals    Frequency    Min 4X/week      PT Plan Current plan remains appropriate    Co-evaluation              AM-PAC PT "6 Clicks" Mobility   Outcome Measure  Help needed turning from your back to your side while  in a flat bed without using bedrails?: A Lot Help needed moving from lying on your back to sitting on the side of a flat bed without using bedrails?: A Lot Help needed moving to and from a bed to a chair (including a wheelchair)?: A Lot Help needed standing up from a chair using your arms (e.g., wheelchair or bedside chair)?: Total Help needed to walk in hospital room?: Total Help needed climbing 3-5 steps with a railing? : Total 6 Click Score: 9    End of Session Equipment Utilized During Treatment: Gait belt Activity Tolerance: Patient tolerated treatment well Patient left: in chair;with call bell/phone within reach Nurse Communication: Mobility status PT Visit Diagnosis: Difficulty in walking, not elsewhere classified (R26.2);Pain;History of falling (Z91.81);Muscle weakness (generalized) (M62.81) Pain - Right/Left: Right Pain - part of body: Hip     Time: 1420-1500 PT Time Calculation (min) (ACUTE ONLY): 40 min  Charges:  $Gait Training: 8-22 mins $Therapeutic Exercise: 8-22 mins $Therapeutic Activity: 8-22 mins                     Rica Koyanagi  PTA Acute  Rehabilitation Services Pager      [phone] Office      [phone]

## 2020-01-29 NOTE — Progress Notes (Signed)
   Subjective: 2 Days Post-Op Procedure(s) (LRB): INTRAMEDULLARY (IM) NAIL FEMORAL (Right) Patient reports pain as mild.   Patient seen in rounds for [name]. Patient is well, and has had no acute complaints or problems other than mild discomfort in the right hip. Hemoglobin of 6.2 this AM, down from 11.3 pre-op. Awaiting SNF placement vs home with 24/7 care. Patient states she slept well last night. She denies CP, SHOB, N/V, dizziness, or light headedness.  We will continue therapy today.   Objective: Vital signs in last 24 hours: Temp:  [98 F (36.7 C)-99.6 F (37.6 C)] 98 F (36.7 C) (02/11 0605) Pulse Rate:  [52-64] 64 (02/11 0605) Resp:  [16-20] 16 (02/11 0604) BP: (115-154)/(46-88) 132/65 (02/11 0605) SpO2:  [81 %-100 %] 100 % (02/11 0605)  Intake/Output from previous day:  Intake/Output Summary (Last 24 hours) at 01/29/2020 0753 Last data filed at 01/29/2020 0600 Gross per 24 hour  Intake 920 ml  Output 200 ml  Net 720 ml     Intake/Output this shift: No intake/output data recorded.  Labs: Recent Labs    01/27/20 0005 01/28/20 0323 01/29/20 0309  HGB 11.3* 7.7* 6.2*   Recent Labs    01/28/20 0323 01/29/20 0309  WBC 5.6 6.2  RBC 2.43* 1.89*  HCT 23.7* 18.2*  PLT 49* 74*   Recent Labs    01/28/20 0323 01/29/20 0309  NA 132* 127*  K 4.6 4.1  CL 104 98  CO2 20* 23  BUN 32* 40*  CREATININE 1.10* 1.25*  GLUCOSE 141* 105*  CALCIUM 8.4* 8.2*   Recent Labs    01/27/20 0005  INR 1.0    Exam: General - Patient is Alert and Oriented Extremity - Neurologically intact Sensation intact distally Intact pulses distally Dorsiflexion/Plantar flexion intact Dressing - dressing C/D/I Motor Function - intact, moving foot and toes well on exam.   Past Medical History:  Diagnosis Date  . Anemia   . Arthritis   . Hyperlipidemia   . Hypertension   . Osteopenia    T score : - 2.4 @ R femoral neck  . Renal insufficiency     Assessment/Plan: 2 Days  Post-Op Procedure(s) (LRB): INTRAMEDULLARY (IM) NAIL FEMORAL (Right) Principal Problem:   Hip fracture (HCC) Active Problems:   Essential hypertension   Dementia (HCC)   CKD (chronic kidney disease) stage 3, GFR 30-59 ml/min  Estimated body mass index is 17.89 kg/m as calculated from the following:   Height as of 09/05/19: 5\' 3"  (1.6 m).   Weight as of 09/05/19: 45.8 kg. Advance diet Up with therapy  DVT Prophylaxis - Aspirin currently PWB to the RLE  Plan is to go vs SNF after hospital stay. Hemoglobin of 6.2 this morning, patient was receiving PRBC on exam this morning. She denies symptoms of lightheadedness or dizziness at this point.   Due to thrombocytopenia and anemia, we will defer DVT prophylaxis to the medical team with aspirin vs lovenox. We typically use Aspirin 81 mg BID x4 weeks. If we utilize other chemical DVT prophylaxis, we would like this for 2 weeks. Leave aquacel dressing in place until follow up. Remain PWB to the RLE.   09/07/19, PA-C Orthopedic Surgery [phone] 01/29/2020, 7:53 AM

## 2020-01-30 LAB — BASIC METABOLIC PANEL
Anion gap: 7 (ref 5–15)
BUN: 37 mg/dL — ABNORMAL HIGH (ref 8–23)
CO2: 21 mmol/L — ABNORMAL LOW (ref 22–32)
Calcium: 8.3 mg/dL — ABNORMAL LOW (ref 8.9–10.3)
Chloride: 103 mmol/L (ref 98–111)
Creatinine, Ser: 1.13 mg/dL — ABNORMAL HIGH (ref 0.44–1.00)
GFR calc Af Amer: 51 mL/min — ABNORMAL LOW (ref >60)
GFR calc non Af Amer: 44 mL/min — ABNORMAL LOW (ref >60)
Glucose, Bld: 94 mg/dL (ref 70–99)
Potassium: 4.1 mmol/L (ref 3.5–5.1)
Sodium: 131 mmol/L — ABNORMAL LOW (ref 135–145)

## 2020-01-30 LAB — CBC
HCT: 24.5 % — ABNORMAL LOW (ref 36.0–46.0)
Hemoglobin: 8.2 g/dL — ABNORMAL LOW (ref 12.0–15.0)
MCH: 30.9 pg (ref 26.0–34.0)
MCHC: 33.5 g/dL (ref 30.0–36.0)
MCV: 92.5 fL (ref 80.0–100.0)
Platelets: 78 10*3/uL — ABNORMAL LOW (ref 150–400)
RBC: 2.65 MIL/uL — ABNORMAL LOW (ref 3.87–5.11)
RDW: 14.3 % (ref 11.5–15.5)
WBC: 5.8 10*3/uL (ref 4.0–10.5)
nRBC: 0 % (ref 0.0–0.2)

## 2020-01-30 LAB — SARS CORONAVIRUS 2 (TAT 6-24 HRS): SARS Coronavirus 2: NEGATIVE

## 2020-01-30 LAB — OSMOLALITY, URINE: Osmolality, Ur: 287 mOsm/kg — ABNORMAL LOW (ref 300–900)

## 2020-01-30 LAB — SODIUM, URINE, RANDOM: Sodium, Ur: 43 mmol/L

## 2020-01-30 LAB — OCCULT BLOOD X 1 CARD TO LAB, STOOL: Fecal Occult Bld: NEGATIVE

## 2020-01-30 MED ORDER — HYDROXYZINE HCL 10 MG PO TABS
10.0000 mg | ORAL_TABLET | Freq: Three times a day (TID) | ORAL | Status: DC | PRN
  Administered 2020-01-30: 10 mg via ORAL
  Filled 2020-01-30: qty 1

## 2020-01-30 MED ORDER — ASPIRIN EC 81 MG PO TBEC
81.0000 mg | DELAYED_RELEASE_TABLET | Freq: Two times a day (BID) | ORAL | Status: DC
  Administered 2020-01-30 – 2020-02-01 (×5): 81 mg via ORAL
  Filled 2020-01-30 (×5): qty 1

## 2020-01-30 NOTE — Progress Notes (Signed)
Physical Therapy Treatment Patient Details Name: Charlene Garza MRN: [id number] DOB: 1930-04-15 Today's Date: 01/30/2020    History of Present Illness 86 y.o. female with medical history significant of dementia, anemia, arthritis, hypertension, hyperlipidemia, osteopenia, CKD stage III presenting to the ED after a mechanical fall.  Patient states she was brought to the hospital after having a fall. Dx of R hip fx, s/p ORIF, PWB 50%.    PT Comments    POD # 3 am session Pt in bed with untouched breakfast tray sleeping.  Easily aroused but was unable to recall her current location or that she "broke her hip".  General Comments: increasingly confused even more so that yesterday.  Following all directions but not able to recall past couple of days. Assisted OOB.  General bed mobility comments: assist to raise trunk and pivot hips to edge of bed using bed pad.  General transfer comment: 50% VC's on proper hand placement and complete 1/4 pivot from elevated bed to [facility] + 2 assist for safety.  + 2 side by side assist from [facility] to partial upright to amb. General Gait Details: limited distance of 4 feet due to effort, fatigue and mild c/o dizziness.  Recliner brought to pt from behind. Positioned in recliner and set up her lunch tray.  Chair alarm active.    Follow Up Recommendations  SNF     Equipment Recommendations  None recommended by PT    Recommendations for Other Services       Precautions / Restrictions Precautions Precautions: Fall Restrictions Weight Bearing Restrictions: Yes RLE Weight Bearing: Partial weight bearing RLE Partial Weight Bearing Percentage or Pounds: 50% Other Position/Activity Restrictions: pt re educated as she was unable to recall    Mobility  Bed Mobility Overal bed mobility: Needs Assistance Bed Mobility: Supine to Sit     Supine to sit: Max assist     General bed mobility comments: assist to raise trunk and pivot hips to edge of bed using bed  pad  Transfers Overall transfer level: Needs assistance Equipment used: Rolling walker (2 wheeled) Transfers: Sit to/from [employer] Sit to Stand: Max assist   Squat pivot transfers: Max assist     General transfer comment: 50% VC's on proper hand placement and complete 1/4 pivot from elevated bed to [facility] + 2 assist for safety.  + 2 side by side assist from [facility] to partial upright to amb.  Ambulation/Gait Ambulation/Gait assistance: Max assist;+2 physical assistance;+2 safety/equipment Gait Distance (Feet): 4 Feet Assistive device: Rolling walker (2 wheeled) Gait Pattern/deviations: Step-to pattern;Decreased stance time - right;Decreased step length - right;Decreased step length - left Gait velocity: decreased   General Gait Details: limited distance of 4 feet due to effort, fatigue and mild c/o dizziness.  Recliner brought to pt from behind.   Stairs             Wheelchair Mobility    Modified Rankin (Stroke Patients Only)       Balance                                            Cognition Arousal/Alertness: Awake/alert                                     General Comments: increasingly confused even more so  that yesterday.  Following all directions but not able to recall past couple of days.      Exercises      General Comments        Pertinent Vitals/Pain Pain Assessment: Faces Faces Pain Scale: Hurts a little bit Pain Location: R hip Pain Descriptors / Indicators: Discomfort;Operative site guarding Pain Intervention(s): Monitored during session;Repositioned    Home Living                      Prior Function            PT Goals (current goals can now be found in the care plan section) Progress towards PT goals: Progressing toward goals    Frequency    Min 4X/week      PT Plan Current plan remains appropriate    Co-evaluation              AM-PAC PT "6 Clicks" Mobility    Outcome Measure  Help needed turning from your back to your side while in a flat bed without using bedrails?: A Lot Help needed moving from lying on your back to sitting on the side of a flat bed without using bedrails?: A Lot Help needed moving to and from a bed to a chair (including a wheelchair)?: A Lot Help needed standing up from a chair using your arms (e.g., wheelchair or bedside chair)?: Total Help needed to walk in hospital room?: Total Help needed climbing 3-5 steps with a railing? : Total 6 Click Score: 9    End of Session Equipment Utilized During Treatment: Gait belt Activity Tolerance: Patient tolerated treatment well;Other (comment)(pleasantly confused) Patient left: in chair;with call bell/phone within reach Nurse Communication: Mobility status PT Visit Diagnosis: Difficulty in walking, not elsewhere classified (R26.2);Pain;History of falling (Z91.81);Muscle weakness (generalized) (M62.81) Pain - Right/Left: Right Pain - part of body: Hip     Time: 8546-2703 PT Time Calculation (min) (ACUTE ONLY): 28 min  Charges:  $Gait Training: 8-22 mins $Therapeutic Activity: 8-22 mins                     Felecia Shelling  PTA Acute  Rehabilitation Services Pager      [phone] Office      [phone]

## 2020-01-30 NOTE — Care Management Important Message (Signed)
Important Message  Patient Details IM Letter given to Ezekiel Ina RN Case Manager to present to the Patient Name: Charlene Garza MRN: [id number] Date of Birth: 1930-11-26   Medicare Important Message Given:  Yes     Caren Macadam 01/30/2020, 9:42 AM

## 2020-01-30 NOTE — Progress Notes (Signed)
PHYSICAL THERAPY  Per Morrie Sheldon RN, family "might" take pt home.  If so , pt will need 24/7 care.  Will need [facility] transport.  Will need a RW and 3:1 and a "long tub bench" if not already have.  Pt is confused and HIGH FALL RISK.  Felecia Shelling  PTA Acute  Rehabilitation Services Pager      [phone] Office      [phone]

## 2020-01-30 NOTE — Progress Notes (Signed)
   Subjective: 3 Days Post-Op Procedure(s) (LRB): INTRAMEDULLARY (IM) NAIL FEMORAL (Right) Patient reports pain as mild.   Patient seen in rounds for [name]. Patient is well, and has had no acute complaints or problems other than mild discomfort of the right hip. No acute events overnight. Denies CP, SHOB, N/V. Awaiting placement.  We will continue therapy today.   Objective: Vital signs in last 24 hours: Temp:  [97.9 F (36.6 C)-98.3 F (36.8 C)] 97.9 F (36.6 C) (02/12 1324) Pulse Rate:  [50-60] 57 (02/12 1324) Resp:  [17] 17 (02/12 1324) BP: (114-169)/(60-85) 133/63 (02/12 1324) SpO2:  [97 %-100 %] 100 % (02/12 1324) Weight:  [52.6 kg] 52.6 kg (02/11 2224)  Intake/Output from previous day:  Intake/Output Summary (Last 24 hours) at 01/30/2020 1454 Last data filed at 01/30/2020 1300 Gross per 24 hour  Intake 360 ml  Output 1350 ml  Net -990 ml     Intake/Output this shift: Total I/O In: 360 [P.O.:360] Out: -   Labs: Recent Labs    01/28/20 0323 01/29/20 0309 01/29/20 1621 01/30/20 0316  HGB 7.7* 6.2* 9.0* 8.2*   Recent Labs    01/29/20 1621 01/30/20 0316  WBC 6.5 5.8  RBC 2.89*  2.93* 2.65*  HCT 27.9* 24.5*  PLT 74*  67* 78*   Recent Labs    01/29/20 0309 01/30/20 0316  NA 127* 131*  K 4.1 4.1  CL 98 103  CO2 23 21*  BUN 40* 37*  CREATININE 1.25* 1.13*  GLUCOSE 105* 94  CALCIUM 8.2* 8.3*   Recent Labs    01/29/20 1621  INR 1.0    Exam: General - Patient is Alert and Appropriate Extremity - Neurologically intact Sensation intact distally Intact pulses distally Dorsiflexion/Plantar flexion intact Dressing - dressing C/D/I Motor Function - intact, moving foot and toes well on exam.   Past Medical History:  Diagnosis Date  . Anemia   . Arthritis   . Hyperlipidemia   . Hypertension   . Osteopenia    T score : - 2.4 @ R femoral neck  . Renal insufficiency     Assessment/Plan: 3 Days Post-Op Procedure(s) (LRB): INTRAMEDULLARY  (IM) NAIL FEMORAL (Right) Principal Problem:   Hip fracture (HCC) Active Problems:   Essential hypertension   Dementia (HCC)   CKD (chronic kidney disease) stage 3, GFR 30-59 ml/min  Estimated body mass index is 22.65 kg/m as calculated from the following:   Height as of this encounter: 5' (1.524 m).   Weight as of this encounter: 52.6 kg. Advance diet Up with therapy  DVT Prophylaxis - Per medicine team  Plan is to go Skilled nursing facility after hospital stay. She is ready for discharge from an orthopaedic standpoint. Leave Aquacel in place, remain PWB RLE. Follow up with [name] 2 weeks after surgery.  Charlene Bible, Charlene Garza Orthopedic Surgery [phone] 01/30/2020, 2:54 PM

## 2020-01-30 NOTE — Progress Notes (Signed)
PROGRESS NOTE    Charlene Garza  BXU:[id number] DOB: 06/25/1930 DOA: 01/26/2020 PCP: Myrlene Broker, MD  Brief Narrative:86 y.o.femalewith medical history significant ofdementia, anemia, arthritis, hypertension, hyperlipidemia, osteopenia, CKD stage III presenting to the ED after amechanicalfall.Patient states she was brought to the hospital after having a fall. She thinks she might have slipped but does not remember what exactly happened. No other complaints. Denies striking her head. Denies headaches, neck pain, back pain, or hip pain. Denies fevers, chills, chest pain, shortness of breath, cough, nausea, vomiting, abdominal pain, diarrhea, or dysuria.  ED Course:Afebrile. Not tachycardic. Blood pressure significantly elevated (225/108) on arrival. Not hypoxic. Labs showing no leukocytosis. Hemoglobin 11.3, no significant change from prior labs. Creatinine 1.3, no significant change from baseline. SARS-CoV-2 PCR test negative. Chest x-ray showing no active cardiopulmonary disease. X-ray showing right intertrochanteric fracture with varus angulation. CT head negative for acute intracranial abnormality. CT C-spine negative for acute bony abnormality. 1 L LR bolus ordered. ED provider discussed the case with [name] from orthopedics, will consult in a.m. Assessment & Plan:   Principal Problem:   Hip fracture (HCC) Active Problems:   Essential hypertension   Dementia (HCC)   CKD (chronic kidney disease) stage 3, GFR 30-59 ml/min   #1 right hip fracture status post mechanical fall ORIF 01/28/2020. Seen by PT recommends SNF.  Partial weightbearing to right lower extremity.  Follow-up with Ortho in 2 weeks. Continue Tylenol 3 times a day.  #2 acute anemia her hemoglobin is 6.2 down from 11.3 on admission.  Hemoglobin 8.2 today with improvement in platelets to 78. She does not have any active bleeding noted.  FOBT ordered not done.  She received 2 units of blood  transfusion  #3 essential hypertension blood pressure 133/63 stable.   #4 stage III CKD creatinine 1.13 down from 1.25.    #5 hyponatremia sodium 131 up from 127 with normal saline.  Continue slow hydration.  #6 hypoxia patient was reported to be hypoxic requiring 8 L of oxygen this morning.  However it was found that the pulse ox machine itself was wrong and she was saturating 2 L 100%.  So  stat chest x-ray that was ordered was canceled.  She is 100% on 1 L.   Estimated body mass index is 22.65 kg/m as calculated from the following:   Height as of this encounter: 5' (1.524 m).   Weight as of this encounter: 52.6 kg.  DVT prophylaxis:Aspirin Code Status:Full code  family Communication: Discussed with daughter disposition Plan:Patient admitted from home. Plan is DC to SNF seen by physical therapy.  Barrier to discharge finding placement and patient getting blood transfusion for acute anemia currently no evidence of active bleeding other than she is postsurgical.    Consultants:  Ortho  Procedures:Right hip ORIF Antimicrobials:None  Subjective: Resting in bed in nad  Objective: Vitals:   01/29/20 2223 01/29/20 2224 01/30/20 0520 01/30/20 1324  BP: 114/60  (!) 164/85 133/63  Pulse: (!) 50  (!) 58 (!) 57  Resp:    17  Temp:   98.1 F (36.7 C) 97.9 F (36.6 C)  TempSrc:   Axillary Oral  SpO2:   97% 100%  Weight:  52.6 kg    Height:        Intake/Output Summary (Last 24 hours) at 01/30/2020 1329 Last data filed at 01/30/2020 1300 Gross per 24 hour  Intake 580 ml  Output 1350 ml  Net -770 ml   [employer]   01/29/20  2224  Weight: 52.6 kg    Examination:  General exam: Appears calm and comfortable  Respiratory system: Clear to auscultation. Respiratory effort normal. Cardiovascular system: S1 & S2 heard, RRR. No JVD, murmurs, rubs, gallops or clicks. No pedal edema. Gastrointestinal system: Abdomen is nondistended, soft and nontender. No  organomegaly or masses felt. Normal bowel sounds heard. Central nervous system: Alert and oriented. No focal neurological deficits. Extremities: Symmetric 5 x 5 power. Skin: No rashes, lesions or ulcers Psychiatry: Judgement and insight appear normal. Mood & affect appropriate.     Data Reviewed: I have personally reviewed following labs and imaging studies  CBC: Recent Labs  Lab 01/27/20 0005 01/28/20 0323 01/29/20 0309 01/29/20 1621 01/30/20 0316  WBC 6.9 5.6 6.2 6.5 5.8  NEUTROABS 5.8  --   --   --   --   HGB 11.3* 7.7* 6.2* 9.0* 8.2*  HCT 33.0* 23.7* 18.2* 27.9* 24.5*  MCV 95.4 97.5 96.3 96.5 92.5  PLT 179 49* 74* 74*  67* 78*   Basic Metabolic Panel: Recent Labs  Lab 01/27/20 0005 01/28/20 0323 01/29/20 0309 01/30/20 0316  NA 134* 132* 127* 131*  K 4.7 4.6 4.1 4.1  CL 101 104 98 103  CO2 22 20* 23 21*  GLUCOSE 118* 141* 105* 94  BUN 47* 32* 40* 37*  CREATININE 1.39* 1.10* 1.25* 1.13*  CALCIUM 9.3 8.4* 8.2* 8.3*   GFR: Estimated Creatinine Clearance: 25.2 mL/min (A) (by C-G formula based on SCr of 1.13 mg/dL (H)). Liver Function Tests: No results for input(s): AST, ALT, ALKPHOS, BILITOT, PROT, ALBUMIN in the last 168 hours. No results for input(s): LIPASE, AMYLASE in the last 168 hours. No results for input(s): AMMONIA in the last 168 hours. Coagulation Profile: Recent Labs  Lab 01/27/20 0005 01/29/20 1621  INR 1.0 1.0   Cardiac Enzymes: No results for input(s): CKTOTAL, CKMB, CKMBINDEX, TROPONINI in the last 168 hours. BNP (last 3 results) No results for input(s): PROBNP in the last 8760 hours. HbA1C: No results for input(s): HGBA1C in the last 72 hours. CBG: Recent Labs  Lab 01/27/20 1028  GLUCAP 96   Lipid Profile: No results for input(s): CHOL, HDL, LDLCALC, TRIG, CHOLHDL, LDLDIRECT in the last 72 hours. Thyroid Function Tests: No results for input(s): TSH, T4TOTAL, FREET4, T3FREE, THYROIDAB in the last 72 hours. Anemia Panel: Recent  Labs    01/29/20 1621  VITAMINB12 239  FOLATE 7.7  FERRITIN 86  TIBC 291  IRON 48  RETICCTPCT 2.3   Sepsis Labs: No results for input(s): PROCALCITON, LATICACIDVEN in the last 168 hours.  Recent Results (from the past 240 hour(s))  Respiratory Panel by RT PCR (Flu A&B, Covid) - Nasopharyngeal Swab     Status: None   Collection Time: 01/26/20 10:52 PM   Specimen: Nasopharyngeal Swab  Result Value Ref Range Status   SARS Coronavirus 2 by RT PCR NEGATIVE NEGATIVE Final    Comment: (NOTE) SARS-CoV-2 target nucleic acids are NOT DETECTED. The SARS-CoV-2 RNA is generally detectable in upper respiratoy specimens during the acute phase of infection. The lowest concentration of SARS-CoV-2 viral copies this assay can detect is 131 copies/mL. A negative result does not preclude SARS-Cov-2 infection and should not be used as the sole basis for treatment or other patient management decisions. A negative result may occur with  improper specimen collection/handling, submission of specimen other than nasopharyngeal swab, presence of viral mutation(s) within the areas targeted by this assay, and inadequate number of viral copies (<131 copies/mL).  A negative result must be combined with clinical observations, patient history, and epidemiological information. The expected result is Negative. Fact Sheet for Patients:  https://www.moore.com/ Fact Sheet for Healthcare Providers:  https://www.young.biz/ This test is not yet ap proved or cleared by the [place] FDA and  has been authorized for detection and/or diagnosis of SARS-CoV-2 by FDA under an Emergency Use Authorization (EUA). This EUA will remain  in effect (meaning this test can be used) for the duration of the COVID-19 declaration under Section 564(b)(1) of the Act, 21 U.S.C. section 360bbb-3(b)(1), unless the authorization is terminated or revoked sooner.    Influenza A by PCR NEGATIVE  NEGATIVE Final   Influenza B by PCR NEGATIVE NEGATIVE Final    Comment: (NOTE) The Xpert Xpress SARS-CoV-2/FLU/RSV assay is intended as an aid in  the diagnosis of influenza from Nasopharyngeal swab specimens and  should not be used as a sole basis for treatment. Nasal washings and  aspirates are unacceptable for Xpert Xpress SARS-CoV-2/FLU/RSV  testing. Fact Sheet for Patients: https://www.moore.com/ Fact Sheet for Healthcare Providers: https://www.young.biz/ This test is not yet approved or cleared by the [place] FDA and  has been authorized for detection and/or diagnosis of SARS-CoV-2 by  FDA under an Emergency Use Authorization (EUA). This EUA will remain  in effect (meaning this test can be used) for the duration of the  Covid-19 declaration under Section 564(b)(1) of the Act, 21  U.S.C. section 360bbb-3(b)(1), unless the authorization is  terminated or revoked. Performed at [facility], [address] [place], [place] [address]   Surgical PCR screen     Status: None   Collection Time: 01/27/20 12:44 PM   Specimen: Nasal Mucosa; Nasal Swab  Result Value Ref Range Status   MRSA, PCR NEGATIVE NEGATIVE Final   Staphylococcus aureus NEGATIVE NEGATIVE Final    Comment: (NOTE) The Xpert SA Assay (FDA approved for NASAL specimens in patients 37 years of age and older), is one component of a comprehensive surveillance program. It is not intended to diagnose infection nor to guide or monitor treatment. Performed at [facility], [address] [place], [place] [address]   SARS CORONAVIRUS 2 (TAT 6-24 HRS) Nasopharyngeal Nasopharyngeal Swab     Status: None   Collection Time: 01/29/20  4:54 PM   Specimen: Nasopharyngeal Swab  Result Value Ref Range Status   SARS Coronavirus 2 NEGATIVE NEGATIVE Final    Comment: (NOTE) SARS-CoV-2 target nucleic acids are NOT DETECTED. The SARS-CoV-2 RNA is  generally detectable in upper and lower respiratory specimens during the acute phase of infection. Negative results do not preclude SARS-CoV-2 infection, do not rule out co-infections with other pathogens, and should not be used as the sole basis for treatment or other patient management decisions. Negative results must be combined with clinical observations, patient history, and epidemiological information. The expected result is Negative. Fact Sheet for Patients: HairSlick.no Fact Sheet for Healthcare Providers: quierodirigir.com This test is not yet approved or cleared by the [place] FDA and  has been authorized for detection and/or diagnosis of SARS-CoV-2 by FDA under an Emergency Use Authorization (EUA). This EUA will remain  in effect (meaning this test can be used) for the duration of the COVID-19 declaration under Section 56 4(b)(1) of the Act, 21 U.S.C. section 360bbb-3(b)(1), unless the authorization is terminated or revoked sooner. Performed at [facility], [address] [place], [place] [address]          Radiology  Studies: ECHOCARDIOGRAM COMPLETE  Result Date: 01/29/2020    ECHOCARDIOGRAM REPORT   Patient Name:   Charlene Garza Date of Exam: 01/29/2020 Medical Rec #:  [id number]      Height:       63.0 in Accession #:    [id number]     Weight:       101.0 lb Date of Birth:  05-Aug-1930      BSA:          1.45 m Patient Age:    72 years       BP:           138/88 mmHg Patient Gender: F              HR:           65 bpm. Exam Location:  Inpatient Procedure: 2D Echo Indications:    abnormal ECG 794.31  History:        Patient has prior history of Echocardiogram examinations, most                 recent 10/15/2012. Risk Factors:Hypertension and Dyslipidemia.  Sonographer:    Jannett Celestine RDCS (AE) Referring Phys: [id number] Noland Fordyce Dalana Pfahler  Sonographer Comments: Suboptimal apical window. off axis apical windows  IMPRESSIONS  1. Left ventricular ejection fraction, by estimation, is 60 to 65%. The left ventricle has normal function. The left ventrical has no regional wall motion abnormalities. Left ventricular diastolic parameters are indeterminate.  2. Right ventricular systolic function is normal. The right ventricular size is normal.  3. Left atrial size was mildly dilated.  4. Right atrial size was mildly dilated.  5. The mitral valve is normal in structure and function. Mild mitral valve regurgitation.  6. The aortic valve is tricuspid. Aortic valve regurgitation is trivial. FINDINGS  Left Ventricle: Left ventricular ejection fraction, by estimation, is 60 to 65%. The left ventricle has normal function. The left ventricle has no regional wall motion abnormalities. There is no left ventricular hypertrophy. Left ventricular diastolic parameters are indeterminate. Right Ventricle: The right ventricular size is normal. No increase in right ventricular wall thickness. Right ventricular systolic function is normal. Left Atrium: Left atrial size was mildly dilated. Right Atrium: Right atrial size was mildly dilated. Pericardium: There is no evidence of pericardial effusion. Mitral Valve: The mitral valve is normal in structure and function. Mild mitral valve regurgitation. Tricuspid Valve: The tricuspid valve is normal in structure. Tricuspid valve regurgitation is mild. Aortic Valve: The aortic valve is tricuspid. . There is moderate thickening and moderate calcification of the aortic valve. Aortic valve regurgitation is trivial. There is moderate thickening of the aortic valve. There is moderate calcification of the aortic valve. Pulmonic Valve: The pulmonic valve was normal in structure. Pulmonic valve regurgitation is not visualized. Aorta: The aortic root, ascending aorta and aortic arch are all structurally normal, with no evidence of dilitation or obstruction. IAS/Shunts: The atrial septum is grossly normal.  LEFT  VENTRICLE PLAX 2D LVIDd:         5.00 cm LVIDs:         3.10 cm LV PW:         1.10 cm LV IVS:        0.90 cm LVOT diam:     1.90 cm LV SV:         44.23 ml LV SV Index:   56.61 LVOT Area:     2.84 cm  LEFT ATRIUM  Index       RIGHT ATRIUM           Index LA diam:      5.10 cm 3.53 cm/m  RA Area:     19.60 cm LA Vol (A4C): 57.4 ml 39.69 ml/m RA Volume:   51.00 ml  35.26 ml/m  AORTIC VALVE LVOT Vmax:   79.60 cm/s LVOT Vmean:  61.100 cm/s LVOT VTI:    0.156 m  AORTA Ao Root diam: 2.80 cm MITRAL VALVE MV Area (PHT): 3.48 cm            SHUNTS MV Decel Time: 218 msec            Systemic VTI:  0.16 m MV E velocity: 68.60 cm/s 103 cm/s Systemic Diam: 1.90 cm Kristeen Miss MD Electronically signed by Kristeen Miss MD Signature Date/Time: 01/29/2020/2:51:54 PM    Final         Scheduled Meds: . acetaminophen  650 mg Oral TID  . bisacodyl  10 mg Rectal Once  . docusate sodium  100 mg Oral BID  . ferrous sulfate  325 mg Oral TID PC  . memantine  5 mg Oral Daily  . senna  1 tablet Oral BID   Continuous Infusions: . methocarbamol (ROBAXIN) IV       LOS: 3 days     Alwyn Ren, MD  01/30/2020, 1:29 PM

## 2020-01-30 NOTE — TOC Progression Note (Signed)
Transition of Care [facility]) - Progression Note    Patient Details  Name: Charlene Garza MRN: [id number] Date of Birth: 26-Jan-1930  Transition of [facility]) CM/SW Contact  Geni Bers, RN Phone Number: 01/30/2020, 1:24 PM  Clinical Narrative:    Daughter was called concerning bed offers. She selected [facility]. A call to [facility] admission coordinator states bed would be ready until tomorrow. [employer] was started.    Expected Discharge Plan: Skilled Nursing Facility Barriers to Discharge: No Barriers Identified  Expected Discharge Plan and Services Expected Discharge Plan: Skilled Nursing Facility   Discharge Planning Services: CM Consult   Living arrangements for the past 2 months: Single Family Home Expected Discharge Date: 01/29/20                                     Social Determinants of Health (SDOH) Interventions    Readmission Risk Interventions No flowsheet data found.

## 2020-01-31 ENCOUNTER — Inpatient Hospital Stay (HOSPITAL_COMMUNITY): Payer: Medicare Other

## 2020-01-31 LAB — TYPE AND SCREEN
ABO/RH(D): A NEG
Antibody Screen: POSITIVE
Donor AG Type: NEGATIVE
Donor AG Type: NEGATIVE
Donor AG Type: NEGATIVE
PT AG Type: NEGATIVE
Unit division: 0
Unit division: 0
Unit division: 0

## 2020-01-31 LAB — BPAM RBC
Blood Product Expiration Date: 202103092359
Blood Product Expiration Date: 202103162359
Blood Product Expiration Date: 202103162359
ISSUE DATE / TIME: 202102110541
ISSUE DATE / TIME: 202102111048
Unit Type and Rh: 600
Unit Type and Rh: 600
Unit Type and Rh: 600

## 2020-01-31 LAB — BASIC METABOLIC PANEL
Anion gap: 6 (ref 5–15)
BUN: 40 mg/dL — ABNORMAL HIGH (ref 8–23)
CO2: 23 mmol/L (ref 22–32)
Calcium: 8.2 mg/dL — ABNORMAL LOW (ref 8.9–10.3)
Chloride: 102 mmol/L (ref 98–111)
Creatinine, Ser: 1.07 mg/dL — ABNORMAL HIGH (ref 0.44–1.00)
GFR calc Af Amer: 54 mL/min — ABNORMAL LOW (ref >60)
GFR calc non Af Amer: 47 mL/min — ABNORMAL LOW (ref >60)
Glucose, Bld: 97 mg/dL (ref 70–99)
Potassium: 4.3 mmol/L (ref 3.5–5.1)
Sodium: 131 mmol/L — ABNORMAL LOW (ref 135–145)

## 2020-01-31 LAB — CBC
HCT: 23.9 % — ABNORMAL LOW (ref 36.0–46.0)
Hemoglobin: 7.9 g/dL — ABNORMAL LOW (ref 12.0–15.0)
MCH: 30.6 pg (ref 26.0–34.0)
MCHC: 33.1 g/dL (ref 30.0–36.0)
MCV: 92.6 fL (ref 80.0–100.0)
Platelets: 100 10*3/uL — ABNORMAL LOW (ref 150–400)
RBC: 2.58 MIL/uL — ABNORMAL LOW (ref 3.87–5.11)
RDW: 14.2 % (ref 11.5–15.5)
WBC: 5.8 10*3/uL (ref 4.0–10.5)
nRBC: 0 % (ref 0.0–0.2)

## 2020-01-31 MED ORDER — SALINE SPRAY 0.65 % NA SOLN
1.0000 | NASAL | Status: DC | PRN
  Administered 2020-01-31: 1 via NASAL
  Filled 2020-01-31: qty 44

## 2020-01-31 MED ORDER — VITAMIN C 250 MG PO TABS: 250.0000 mg | ORAL_TABLET | Freq: Three times a day (TID) | ORAL | Status: DC

## 2020-01-31 MED ORDER — ONDANSETRON HCL 4 MG PO TABS: 4.0000 mg | ORAL_TABLET | Freq: Four times a day (QID) | ORAL | 0 refills | Status: DC | PRN

## 2020-01-31 MED ORDER — HYDROXYZINE HCL 10 MG PO TABS: 10.0000 mg | ORAL_TABLET | Freq: Three times a day (TID) | ORAL | 0 refills | Status: DC | PRN

## 2020-01-31 MED ORDER — SALINE SPRAY 0.65 % NA SOLN: 1.0000 | NASAL | 0 refills | Status: DC | PRN

## 2020-01-31 MED ORDER — SENNA 8.6 MG PO TABS: 1.0000 | ORAL_TABLET | Freq: Two times a day (BID) | ORAL | 0 refills | Status: DC

## 2020-01-31 MED ORDER — DOCUSATE SODIUM 100 MG PO CAPS: 100.0000 mg | ORAL_CAPSULE | Freq: Two times a day (BID) | ORAL | 0 refills | Status: DC

## 2020-01-31 MED ORDER — MENTHOL 3 MG MT LOZG: 1.0000 | LOZENGE | OROMUCOSAL | 12 refills | Status: DC | PRN

## 2020-01-31 MED ORDER — POLYETHYLENE GLYCOL 3350 17 G PO PACK: 17.0000 g | PACK | Freq: Every day | ORAL | 0 refills | Status: DC | PRN

## 2020-01-31 NOTE — Discharge Summary (Addendum)
Physician Discharge Summary  Rayvon Brandvold BJS:[id number] DOB: 05-07-1930 DOA: 01/26/2020  PCP: Hoyt Koch, MD  Admit date: 01/26/2020 Discharge date 02/01/2020 Admitted From: Home  disposition: Nursing home  recommendations for Outpatient Follow-up:  1. Follow up with PCP in 1-2 weeks 2. Please obtain BMP/CBC in one week 3. Please follow up with Dr. Alvan Dame in 2 weeks  Home Health: None  equipment/Devices: None  Discharge Condition stable and improved CODE STATUS DO NOT RESUSCITATE Diet recommendation: Cardiac diet Brief/Interim Summary:86 y.o.femalewith medical history significant ofdementia, anemia, arthritis, hypertension, hyperlipidemia, osteopenia, CKD stage III presenting to the ED after amechanicalfall.Patient states she was brought to the hospital after having a fall. She thinks she might have slipped but does not remember what exactly happened. No other complaints. Denies striking her head. Denies headaches, neck pain, back pain, or hip pain. Denies fevers, chills, chest pain, shortness of breath, cough, nausea, vomiting, abdominal pain, diarrhea, or dysuria. ED Course:Afebrile. Not tachycardic. Blood pressure significantly elevated (225/108) on arrival. Not hypoxic. Labs showing no leukocytosis. Hemoglobin 11.3, no significant change from prior labs. Creatinine 1.3, no significant change from baseline. SARS-CoV-2 PCR test negative. Chest x-ray showing no active cardiopulmonary disease. X-ray showing right intertrochanteric fracture with varus angulation. CT head negative for acute intracranial abnormality. CT C-spine negative for acute bony abnormality.  Discharge Diagnoses:  Principal Problem:   Hip fracture [facility]) Active Problems:   Essential hypertension   Dementia (HCC)   CKD (chronic kidney disease) stage 3, GFR 30-59 ml/min   #1 right hip fracture status post mechanical fall ORIF 01/28/2020. Seen by PT recommends SNF. Partial weightbearing  to right lower extremity. Follow-up with Ortho in 2 weeks. Continue Tylenol 3 times a day. Aspirin 81 mg twice a day for DVT prophylaxis for 28 days.  #2 acute anemia her hemoglobin is 6.2 down from 11.3on admission.   Hemoglobin 7.9 on the day of discharge.  She got 2 units of blood transfusion.  Please follow her CBC on Monday to make sure her hemoglobin is remaining stable.  There is no evidence of active bleeding noted. FOBT has been negative.  #3 essential hypertension-continue atenolol.    #4 stage III CKD creatinine 1.13 down from 1.25.    #5 hyponatremia sodium 131 up from 127 with normal saline.   Estimated body mass index is 22.65 kg/m as calculated from the following:   Height as of this encounter: 5' (1.524 m).   Weight as of this encounter: 52.6 kg.  Discharge Instructions  Discharge Instructions    Call MD / Call 911   Complete by: As directed    If you experience chest pain or shortness of breath, CALL 911 and be transported to the hospital emergency room.  If you develope a fever above 101 F, pus (white drainage) or increased drainage or redness at the wound, or calf pain, call your surgeon's office.   Change dressing   Complete by: As directed    Maintain surgical dressing until follow up in the clinic. If the edges start to pull up, may reinforce with tape. If the dressing is no longer working, may remove and cover with gauze and tape, but must keep the area dry and clean.  Call with any questions or concerns.   Constipation Prevention   Complete by: As directed    Drink plenty of fluids.  Prune juice may be helpful.  You may use a stool softener, such as Colace (over the counter) 100 mg twice a day.  Use MiraLax (over the counter) for constipation as needed.   Diet - low sodium heart healthy   Complete by: As directed    Discharge instructions   Complete by: As directed    Maintain surgical dressing until follow up in the clinic. If the edges start to pull  up, may reinforce with tape. If the dressing is no longer working, may remove and cover with gauze and tape, but must keep the area dry and clean.  [facility] Call with any questions or concerns.   Increase activity slowly as tolerated   Complete by: As directed    Partial weight bearing with assist device as directed.   TED hose   Complete by: As directed    Use stockings (TED hose) for 2 weeks on both leg(s).  You may remove them at night for sleeping.     Allergies as of 01/31/2020      Reactions   Ambien Cr [zolpidem Tartrate Er]    10/2012 mental status changes postoperatively   Lovastatin    10/02/13 CK 276      Medication List    TAKE these medications   aspirin 81 MG EC tablet Take 1 tablet (81 mg total) by mouth 2 (two) times daily for 28 days.   atenolol 25 MG tablet Commonly known as: TENORMIN Take 1 tablet (25 mg total) by mouth 2 (two) times daily. Need office visit before next refill   docusate sodium 100 MG capsule Commonly known as: COLACE Take 1 capsule (100 mg total) by mouth 2 (two) times daily.   ferrous sulfate 325 (65 FE) MG tablet Take 1 tablet (325 mg total) by mouth 3 (three) times daily after meals for 28 days.   HYDROcodone-acetaminophen 5-325 MG tablet Commonly known as: NORCO/VICODIN Take 1-2 tablets by mouth every 6 (six) hours as needed for moderate pain or severe pain (pain score 4-6). What changed: reasons to take this   hydrOXYzine 10 MG tablet Commonly known as: ATARAX/VISTARIL Take 1 tablet (10 mg total) by mouth 3 (three) times daily as needed for anxiety.   memantine 5 MG tablet Commonly known as: NAMENDA Take 1 tablet (5 mg total) by mouth daily.   menthol-cetylpyridinium 3 MG lozenge Commonly known as: CEPACOL Take 1 lozenge (3 mg total) by mouth as needed for sore throat (sore throat).   methocarbamol 500 MG tablet Commonly known as: ROBAXIN Take 1 tablet (500 mg total) by mouth every 6 (six) hours  as needed for muscle spasms.   ondansetron 4 MG tablet Commonly known as: ZOFRAN Take 1 tablet (4 mg total) by mouth every 6 (six) hours as needed for nausea.   polyethylene glycol 17 g packet Commonly known as: MIRALAX / GLYCOLAX Take 17 g by mouth daily as needed for mild constipation.   senna 8.6 MG Tabs tablet Commonly known as: SENOKOT Take 1 tablet (8.6 mg total) by mouth 2 (two) times daily.   sodium chloride 0.65 % Soln nasal spray Commonly known as: OCEAN Place 1 spray into both nostrils as needed for congestion.            Discharge Care Instructions  (From admission, onward)         Start     Ordered   01/29/20 0000  Change dressing    Comments: Maintain surgical dressing until follow up in the clinic. If the edges start to pull up, may reinforce with tape. If the dressing is no longer  working, may remove and cover with gauze and tape, but must keep the area dry and clean.  Call with any questions or concerns.   01/29/20 1008         Follow-up Information    Durene Romans, MD. Schedule an appointment as soon as possible for a visit in 2 weeks.   Specialty: Orthopedic Surgery Contact information: [address] [place] [place] [address] [phone]          Allergies  Allergen Reactions  . Ambien Cr [Zolpidem Tartrate Er]     10/2012 mental status changes postoperatively  . Lovastatin     10/02/13 CK 276    Consultations: [name]  Procedures/Studies: DG Chest 1 View  Result Date: 01/26/2020 CLINICAL DATA:  Fall.  Preop EXAM: CHEST  1 VIEW COMPARISON:  10/14/2012 FINDINGS: Cardiomegaly. Calcified granuloma in the right mid lung. No confluent opacities or effusions. No acute bony abnormality. IMPRESSION: Cardiomegaly.  No active cardiopulmonary disease. Electronically Signed   By: Charlett Nose M.D.   On: 01/26/2020 23:09   CT HEAD WO CONTRAST  Result Date: 01/26/2020 CLINICAL DATA:  Fall EXAM: CT HEAD WITHOUT CONTRAST TECHNIQUE:  Contiguous axial images were obtained from the base of the skull through the vertex without intravenous contrast. COMPARISON:  None. FINDINGS: Brain: There is atrophy and chronic small vessel disease changes. No acute intracranial abnormality. Specifically, no hemorrhage, hydrocephalus, mass lesion, acute infarction, or significant intracranial injury. Vascular: No hyperdense vessel or unexpected calcification. Skull: No acute calvarial abnormality. Sinuses/Orbits: Visualized paranasal sinuses and mastoids clear. Orbital soft tissues unremarkable. Other: None IMPRESSION: Atrophy, chronic microvascular disease. No acute intracranial abnormality. Electronically Signed   By: Charlett Nose M.D.   On: 01/26/2020 23:36   CT CERVICAL SPINE WO CONTRAST  Result Date: 01/26/2020 CLINICAL DATA:  Fall EXAM: CT CERVICAL SPINE WITHOUT CONTRAST TECHNIQUE: Multidetector CT imaging of the cervical spine was performed without intravenous contrast. Multiplanar CT image reconstructions were also generated. COMPARISON:  None. FINDINGS: Alignment: No subluxation. Skull base and vertebrae: No acute fracture. No primary bone lesion or focal pathologic process. Soft tissues and spinal canal: No prevertebral fluid or swelling. No visible canal hematoma. Disc levels: Advanced diffuse degenerative facet disease and moderate degenerative disc disease. Upper chest: No acute findings Other: Densely calcified carotid arteries bilaterally. IMPRESSION: Degenerative disc and facet disease. No acute bony abnormality. Electronically Signed   By: Charlett Nose M.D.   On: 01/26/2020 23:38   DG C-Arm 1-60 Min-No Report  Result Date: 01/27/2020 Fluoroscopy was utilized by the requesting physician.  No radiographic interpretation.   ECHOCARDIOGRAM COMPLETE  Result Date: 01/29/2020    ECHOCARDIOGRAM REPORT   Patient Name:   Keilee Schuessler Date of Exam: 01/29/2020 Medical Rec #:  [id number]      Height:       63.0 in Accession #:    [id number]      Weight:       101.0 lb Date of Birth:  1930-07-21      BSA:          1.45 m Patient Age:    86 years       BP:           138/88 mmHg Patient Gender: F              HR:           65 bpm. Exam Location:  Inpatient Procedure: 2D Echo Indications:    abnormal ECG 794.31  History:  Patient has prior history of Echocardiogram examinations, most                 recent 10/15/2012. Risk Factors:Hypertension and Dyslipidemia.  Sonographer:    Celene SkeenVijay Shankar RDCS (AE) Referring Phys: [id number] Gardiner RamusELIZABETH G Zachery Niswander  Sonographer Comments: Suboptimal apical window. off axis apical windows IMPRESSIONS  1. Left ventricular ejection fraction, by estimation, is 60 to 65%. The left ventricle has normal function. The left ventrical has no regional wall motion abnormalities. Left ventricular diastolic parameters are indeterminate.  2. Right ventricular systolic function is normal. The right ventricular size is normal.  3. Left atrial size was mildly dilated.  4. Right atrial size was mildly dilated.  5. The mitral valve is normal in structure and function. Mild mitral valve regurgitation.  6. The aortic valve is tricuspid. Aortic valve regurgitation is trivial. FINDINGS  Left Ventricle: Left ventricular ejection fraction, by estimation, is 60 to 65%. The left ventricle has normal function. The left ventricle has no regional wall motion abnormalities. There is no left ventricular hypertrophy. Left ventricular diastolic parameters are indeterminate. Right Ventricle: The right ventricular size is normal. No increase in right ventricular wall thickness. Right ventricular systolic function is normal. Left Atrium: Left atrial size was mildly dilated. Right Atrium: Right atrial size was mildly dilated. Pericardium: There is no evidence of pericardial effusion. Mitral Valve: The mitral valve is normal in structure and function. Mild mitral valve regurgitation. Tricuspid Valve: The tricuspid valve is normal in structure. Tricuspid valve  regurgitation is mild. Aortic Valve: The aortic valve is tricuspid. . There is moderate thickening and moderate calcification of the aortic valve. Aortic valve regurgitation is trivial. There is moderate thickening of the aortic valve. There is moderate calcification of the aortic valve. Pulmonic Valve: The pulmonic valve was normal in structure. Pulmonic valve regurgitation is not visualized. Aorta: The aortic root, ascending aorta and aortic arch are all structurally normal, with no evidence of dilitation or obstruction. IAS/Shunts: The atrial septum is grossly normal.  LEFT VENTRICLE PLAX 2D LVIDd:         5.00 cm LVIDs:         3.10 cm LV PW:         1.10 cm LV IVS:        0.90 cm LVOT diam:     1.90 cm LV SV:         44.23 ml LV SV Index:   56.61 LVOT Area:     2.84 cm  LEFT ATRIUM           Index       RIGHT ATRIUM           Index LA diam:      5.10 cm 3.53 cm/m  RA Area:     19.60 cm LA Vol (A4C): 57.4 ml 39.69 ml/m RA Volume:   51.00 ml  35.26 ml/m  AORTIC VALVE LVOT Vmax:   79.60 cm/s LVOT Vmean:  61.100 cm/s LVOT VTI:    0.156 m  AORTA Ao Root diam: 2.80 cm MITRAL VALVE MV Area (PHT): 3.48 cm            SHUNTS MV Decel Time: 218 msec            Systemic VTI:  0.16 m MV E velocity: 68.60 cm/s 103 cm/s Systemic Diam: 1.90 cm Kristeen MissPhilip Nahser MD Electronically signed by Kristeen MissPhilip Nahser MD Signature Date/Time: 01/29/2020/2:51:54 PM    Final    DG HIP OPERATIVE UNILAT  WITH PELVIS RIGHT  Result Date: 01/27/2020 CLINICAL DATA:  ORIF right hip EXAM: OPERATIVE right HIP (WITH PELVIS IF PERFORMED) 3 VIEWS TECHNIQUE: Fluoroscopic spot image(s) were submitted for interpretation post-operatively. COMPARISON:  None. FINDINGS: The patient is status post IM nail fixation of intratrochanteric fracture. Fluoro time 44 seconds IMPRESSION: Intraop views of ORIF right intratrochanteric fracture. Electronically Signed   By: Jonna Clark M.D.   On: 01/27/2020 18:54   DG Hip Unilat With Pelvis 2-3 Views Right  Result Date:  01/26/2020 CLINICAL DATA:  Fall, right hip pain EXAM: DG HIP (WITH OR WITHOUT PELVIS) 2-3V RIGHT COMPARISON:  None. FINDINGS: There is a right femoral intertrochanteric fracture with displacement and varus angulation. No subluxation or dislocation. No additional acute bony abnormality. IMPRESSION: Right intertrochanteric fracture with varus angulation. Electronically Signed   By: Charlett Nose M.D.   On: 01/26/2020 23:11   (Echo, Carotid, EGD, Colonoscopy, ERCP)    Subjective:  Eating breakfast  Discharge Exam: Vitals:   01/31/20 0635 01/31/20 0641  BP: (!) 172/104   Pulse: (!) 114 62  Resp: 18   Temp: (!) 97.5 F (36.4 C)   SpO2: 97%    Vitals:   01/30/20 1324 01/30/20 2148 01/31/20 0635 01/31/20 0641  BP: 133/63 (!) 156/77 (!) 172/104   Pulse: (!) 57 98 (!) 114 62  Resp: 17 20 18    Temp: 97.9 F (36.6 C) 98.2 F (36.8 C) (!) 97.5 F (36.4 C)   TempSrc: Oral Oral Oral   SpO2: 100% 99% 97%   Weight:      Height:        General: Pt is alert, awake, not in acute distress Cardiovascular: RRR, S1/S2 +, no rubs, no gallops Respiratory: CTA bilaterally, no wheezing, no rhonchi Abdominal: Soft, NT, ND, bowel sounds + Extremities:  Right hip incision CDI   The results of significant diagnostics from this hospitalization (including imaging, microbiology, ancillary and laboratory) are listed below for reference.     Microbiology: Recent Results (from the past 240 hour(s))  Respiratory Panel by RT PCR (Flu A&B, Covid) - Nasopharyngeal Swab     Status: None   Collection Time: 01/26/20 10:52 PM   Specimen: Nasopharyngeal Swab  Result Value Ref Range Status   SARS Coronavirus 2 by RT PCR NEGATIVE NEGATIVE Final    Comment: (NOTE) SARS-CoV-2 target nucleic acids are NOT DETECTED. The SARS-CoV-2 RNA is generally detectable in upper respiratoy specimens during the acute phase of infection. The lowest concentration of SARS-CoV-2 viral copies this assay can detect is 131 copies/mL.  A negative result does not preclude SARS-Cov-2 infection and should not be used as the sole basis for treatment or other patient management decisions. A negative result may occur with  improper specimen collection/handling, submission of specimen other than nasopharyngeal swab, presence of viral mutation(s) within the areas targeted by this assay, and inadequate number of viral copies (<131 copies/mL). A negative result must be combined with clinical observations, patient history, and epidemiological information. The expected result is Negative. Fact Sheet for Patients:  https://www.moore.com/ Fact Sheet for Healthcare Providers:  https://www.young.biz/ This test is not yet ap proved or cleared by the [place] FDA and  has been authorized for detection and/or diagnosis of SARS-CoV-2 by FDA under an Emergency Use Authorization (EUA). This EUA will remain  in effect (meaning this test can be used) for the duration of the COVID-19 declaration under Section 564(b)(1) of the Act, 21 U.S.C. section 360bbb-3(b)(1), unless the authorization is terminated or revoked  sooner.    Influenza A by PCR NEGATIVE NEGATIVE Final   Influenza B by PCR NEGATIVE NEGATIVE Final    Comment: (NOTE) The Xpert Xpress SARS-CoV-2/FLU/RSV assay is intended as an aid in  the diagnosis of influenza from Nasopharyngeal swab specimens and  should not be used as a sole basis for treatment. Nasal washings and  aspirates are unacceptable for Xpert Xpress SARS-CoV-2/FLU/RSV  testing. Fact Sheet for Patients: https://www.moore.com/ Fact Sheet for Healthcare Providers: https://www.young.biz/ This test is not yet approved or cleared by the [place] FDA and  has been authorized for detection and/or diagnosis of SARS-CoV-2 by  FDA under an Emergency Use Authorization (EUA). This EUA will remain  in effect (meaning this test can be used)  for the duration of the  Covid-19 declaration under Section 564(b)(1) of the Act, 21  U.S.C. section 360bbb-3(b)(1), unless the authorization is  terminated or revoked. Performed at [facility], [address], [place], [place] [address]   Surgical PCR screen     Status: None   Collection Time: 01/27/20 12:44 PM   Specimen: Nasal Mucosa; Nasal Swab  Result Value Ref Range Status   MRSA, PCR NEGATIVE NEGATIVE Final   Staphylococcus aureus NEGATIVE NEGATIVE Final    Comment: (NOTE) The Xpert SA Assay (FDA approved for NASAL specimens in patients 29 years of age and older), is one component of a comprehensive surveillance program. It is not intended to diagnose infection nor to guide or monitor treatment. Performed at [facility], [address] [place], [place] [address]   SARS CORONAVIRUS 2 (TAT 6-24 HRS) Nasopharyngeal Nasopharyngeal Swab     Status: None   Collection Time: 01/29/20  4:54 PM   Specimen: Nasopharyngeal Swab  Result Value Ref Range Status   SARS Coronavirus 2 NEGATIVE NEGATIVE Final    Comment: (NOTE) SARS-CoV-2 target nucleic acids are NOT DETECTED. The SARS-CoV-2 RNA is generally detectable in upper and lower respiratory specimens during the acute phase of infection. Negative results do not preclude SARS-CoV-2 infection, do not rule out co-infections with other pathogens, and should not be used as the sole basis for treatment or other patient management decisions. Negative results must be combined with clinical observations, patient history, and epidemiological information. The expected result is Negative. Fact Sheet for Patients: HairSlick.no Fact Sheet for Healthcare Providers: quierodirigir.com This test is not yet approved or cleared by the [place] FDA and  has been authorized for detection and/or diagnosis of SARS-CoV-2 by FDA under an Emergency Use  Authorization (EUA). This EUA will remain  in effect (meaning this test can be used) for the duration of the COVID-19 declaration under Section 56 4(b)(1) of the Act, 21 U.S.C. section 360bbb-3(b)(1), unless the authorization is terminated or revoked sooner. Performed at [facility], [address] [place], [place] [address]      Labs: BNP (last 3 results) No results for input(s): BNP in the last 8760 hours. Basic Metabolic Panel: Recent Labs  Lab 01/27/20 0005 01/28/20 0323 01/29/20 0309 01/30/20 0316 01/31/20 0257  NA 134* 132* 127* 131* 131*  K 4.7 4.6 4.1 4.1 4.3  CL 101 104 98 103 102  CO2 22 20* 23 21* 23  GLUCOSE 118* 141* 105* 94 97  BUN 47* 32* 40* 37* 40*  CREATININE 1.39* 1.10* 1.25* 1.13* 1.07*  CALCIUM 9.3 8.4* 8.2* 8.3* 8.2*   Liver Function Tests: No results for input(s): AST, ALT, ALKPHOS, BILITOT, PROT, ALBUMIN in the last 168  hours. No results for input(s): LIPASE, AMYLASE in the last 168 hours. No results for input(s): AMMONIA in the last 168 hours. CBC: Recent Labs  Lab 01/27/20 0005 01/27/20 0005 01/28/20 0323 01/29/20 0309 01/29/20 1621 01/30/20 0316 01/31/20 0257  WBC 6.9   < > 5.6 6.2 6.5 5.8 5.8  NEUTROABS 5.8  --   --   --   --   --   --   HGB 11.3*   < > 7.7* 6.2* 9.0* 8.2* 7.9*  HCT 33.0*   < > 23.7* 18.2* 27.9* 24.5* 23.9*  MCV 95.4   < > 97.5 96.3 96.5 92.5 92.6  PLT 179   < > 49* 74* 74*  67* 78* 100*   < > = values in this interval not displayed.   Cardiac Enzymes: No results for input(s): CKTOTAL, CKMB, CKMBINDEX, TROPONINI in the last 168 hours. BNP: Invalid input(s): POCBNP CBG: Recent Labs  Lab 01/27/20 1028  GLUCAP 96   D-Dimer Recent Labs    01/29/20 1621  DDIMER 1.83*   Hgb A1c No results for input(s): HGBA1C in the last 72 hours. Lipid Profile No results for input(s): CHOL, HDL, LDLCALC, TRIG, CHOLHDL, LDLDIRECT in the last 72 hours. Thyroid function studies No results for input(s): TSH, T4TOTAL,  T3FREE, THYROIDAB in the last 72 hours.  Invalid input(s): FREET3 Anemia work up Recent Labs    01/29/20 1621  VITAMINB12 239  FOLATE 7.7  FERRITIN 86  TIBC 291  IRON 48  RETICCTPCT 2.3   Urinalysis    Component Value Date/Time   COLORURINE YELLOW 10/18/2012 2032   APPEARANCEUR CLEAR 10/18/2012 2032   LABSPEC 1.015 10/18/2012 2032   PHURINE 5.5 10/18/2012 2032   GLUCOSEU NEGATIVE 10/18/2012 2032   HGBUR NEGATIVE 10/18/2012 2032   BILIRUBINUR NEGATIVE 10/18/2012 2032   KETONESUR NEGATIVE 10/18/2012 2032   PROTEINUR NEGATIVE 10/18/2012 2032   UROBILINOGEN 0.2 10/18/2012 2032   NITRITE NEGATIVE 10/18/2012 2032   LEUKOCYTESUR NEGATIVE 10/18/2012 2032   Sepsis Labs Invalid input(s): PROCALCITONIN,  WBC,  LACTICIDVEN Microbiology Recent Results (from the past 240 hour(s))  Respiratory Panel by RT PCR (Flu A&B, Covid) - Nasopharyngeal Swab     Status: None   Collection Time: 01/26/20 10:52 PM   Specimen: Nasopharyngeal Swab  Result Value Ref Range Status   SARS Coronavirus 2 by RT PCR NEGATIVE NEGATIVE Final    Comment: (NOTE) SARS-CoV-2 target nucleic acids are NOT DETECTED. The SARS-CoV-2 RNA is generally detectable in upper respiratoy specimens during the acute phase of infection. The lowest concentration of SARS-CoV-2 viral copies this assay can detect is 131 copies/mL. A negative result does not preclude SARS-Cov-2 infection and should not be used as the sole basis for treatment or other patient management decisions. A negative result may occur with  improper specimen collection/handling, submission of specimen other than nasopharyngeal swab, presence of viral mutation(s) within the areas targeted by this assay, and inadequate number of viral copies (<131 copies/mL). A negative result must be combined with clinical observations, patient history, and epidemiological information. The expected result is Negative. Fact Sheet for Patients:   https://www.moore.com/ Fact Sheet for Healthcare Providers:  https://www.young.biz/ This test is not yet ap proved or cleared by the [place] FDA and  has been authorized for detection and/or diagnosis of SARS-CoV-2 by FDA under an Emergency Use Authorization (EUA). This EUA will remain  in effect (meaning this test can be used) for the duration of the COVID-19 declaration under Section 564(b)(1) of the Act,  21 U.S.C. section 360bbb-3(b)(1), unless the authorization is terminated or revoked sooner.    Influenza A by PCR NEGATIVE NEGATIVE Final   Influenza B by PCR NEGATIVE NEGATIVE Final    Comment: (NOTE) The Xpert Xpress SARS-CoV-2/FLU/RSV assay is intended as an aid in  the diagnosis of influenza from Nasopharyngeal swab specimens and  should not be used as a sole basis for treatment. Nasal washings and  aspirates are unacceptable for Xpert Xpress SARS-CoV-2/FLU/RSV  testing. Fact Sheet for Patients: https://www.moore.com/ Fact Sheet for Healthcare Providers: https://www.young.biz/ This test is not yet approved or cleared by the [place] FDA and  has been authorized for detection and/or diagnosis of SARS-CoV-2 by  FDA under an Emergency Use Authorization (EUA). This EUA will remain  in effect (meaning this test can be used) for the duration of the  Covid-19 declaration under Section 564(b)(1) of the Act, 21  U.S.C. section 360bbb-3(b)(1), unless the authorization is  terminated or revoked. Performed at [facility], [address] [place], [place] [address]   Surgical PCR screen     Status: None   Collection Time: 01/27/20 12:44 PM   Specimen: Nasal Mucosa; Nasal Swab  Result Value Ref Range Status   MRSA, PCR NEGATIVE NEGATIVE Final   Staphylococcus aureus NEGATIVE NEGATIVE Final    Comment: (NOTE) The Xpert SA Assay (FDA approved for NASAL specimens in patients  67 years of age and older), is one component of a comprehensive surveillance program. It is not intended to diagnose infection nor to guide or monitor treatment. Performed at [facility], [address] [place], [place] [address]   SARS CORONAVIRUS 2 (TAT 6-24 HRS) Nasopharyngeal Nasopharyngeal Swab     Status: None   Collection Time: 01/29/20  4:54 PM   Specimen: Nasopharyngeal Swab  Result Value Ref Range Status   SARS Coronavirus 2 NEGATIVE NEGATIVE Final    Comment: (NOTE) SARS-CoV-2 target nucleic acids are NOT DETECTED. The SARS-CoV-2 RNA is generally detectable in upper and lower respiratory specimens during the acute phase of infection. Negative results do not preclude SARS-CoV-2 infection, do not rule out co-infections with other pathogens, and should not be used as the sole basis for treatment or other patient management decisions. Negative results must be combined with clinical observations, patient history, and epidemiological information. The expected result is Negative. Fact Sheet for Patients: HairSlick.no Fact Sheet for Healthcare Providers: quierodirigir.com This test is not yet approved or cleared by the [place] FDA and  has been authorized for detection and/or diagnosis of SARS-CoV-2 by FDA under an Emergency Use Authorization (EUA). This EUA will remain  in effect (meaning this test can be used) for the duration of the COVID-19 declaration under Section 56 4(b)(1) of the Act, 21 U.S.C. section 360bbb-3(b)(1), unless the authorization is terminated or revoked sooner. Performed at [facility], [address], [place], [place] [address]      Time coordinating discharge: 39 minutes SIGNED:   Alwyn Ren, MD  [facility] 01/31/2020, 9:27 AM Pager   If 7PM-7AM, please contact night-coverage [email or web address] Password [email or web address]

## 2020-01-31 NOTE — TOC Progression Note (Addendum)
Transition of Care [facility]) - Progression Note    Patient Details  Name: Charlene Garza MRN: [id number] Date of Birth: 16-Dec-1930  Transition [facility] Care [facility]) CM/SW Contact  Coralyn Helling, LCSW Phone Number: 01/31/2020, 10:38 AM  Clinical Narrative:   Patient has dc summary up. Facility is currently without power and cannot admit patient at this time. Facility will contact LCSW when power is restored and patient can transfer.   2:55pm; LCSW updated by facility. Power remains out. Facility can accept patient tomorrow.   Expected Discharge Plan: Skilled Nursing Facility Barriers to Discharge: No Barriers Identified  Expected Discharge Plan and Services Expected Discharge Plan: Skilled Nursing Facility   Discharge Planning Services: CM Consult   Living arrangements for the past 2 months: Single Family Home Expected Discharge Date: 01/29/20                                     Social Determinants of Health (SDOH) Interventions    Readmission Risk Interventions No flowsheet data found.

## 2020-02-01 ENCOUNTER — Ambulatory Visit: Payer: Medicare Other

## 2020-02-01 DIAGNOSIS — M8580 Other specified disorders of bone density and structure, unspecified site: Secondary | ICD-10-CM | POA: Diagnosis not present

## 2020-02-01 DIAGNOSIS — E785 Hyperlipidemia, unspecified: Secondary | ICD-10-CM | POA: Diagnosis not present

## 2020-02-01 DIAGNOSIS — F0390 Unspecified dementia without behavioral disturbance: Secondary | ICD-10-CM | POA: Diagnosis not present

## 2020-02-01 DIAGNOSIS — I129 Hypertensive chronic kidney disease with stage 1 through stage 4 chronic kidney disease, or unspecified chronic kidney disease: Secondary | ICD-10-CM | POA: Diagnosis not present

## 2020-02-01 DIAGNOSIS — Z9181 History of falling: Secondary | ICD-10-CM | POA: Diagnosis not present

## 2020-02-01 DIAGNOSIS — R410 Disorientation, unspecified: Secondary | ICD-10-CM | POA: Diagnosis not present

## 2020-02-01 DIAGNOSIS — R41841 Cognitive communication deficit: Secondary | ICD-10-CM | POA: Diagnosis not present

## 2020-02-01 DIAGNOSIS — R2689 Other abnormalities of gait and mobility: Secondary | ICD-10-CM | POA: Diagnosis not present

## 2020-02-01 DIAGNOSIS — R200 Anesthesia of skin: Secondary | ICD-10-CM | POA: Diagnosis not present

## 2020-02-01 DIAGNOSIS — Z96641 Presence of right artificial hip joint: Secondary | ICD-10-CM | POA: Diagnosis not present

## 2020-02-01 DIAGNOSIS — R2681 Unsteadiness on feet: Secondary | ICD-10-CM | POA: Diagnosis not present

## 2020-02-01 DIAGNOSIS — D500 Iron deficiency anemia secondary to blood loss (chronic): Secondary | ICD-10-CM | POA: Diagnosis not present

## 2020-02-01 DIAGNOSIS — Z96652 Presence of left artificial knee joint: Secondary | ICD-10-CM | POA: Diagnosis not present

## 2020-02-01 DIAGNOSIS — Z5189 Encounter for other specified aftercare: Secondary | ICD-10-CM | POA: Diagnosis not present

## 2020-02-01 DIAGNOSIS — I10 Essential (primary) hypertension: Secondary | ICD-10-CM | POA: Diagnosis not present

## 2020-02-01 DIAGNOSIS — N183 Chronic kidney disease, stage 3 unspecified: Secondary | ICD-10-CM | POA: Diagnosis not present

## 2020-02-01 DIAGNOSIS — M6281 Muscle weakness (generalized): Secondary | ICD-10-CM | POA: Diagnosis not present

## 2020-02-01 DIAGNOSIS — M1990 Unspecified osteoarthritis, unspecified site: Secondary | ICD-10-CM | POA: Diagnosis not present

## 2020-02-01 DIAGNOSIS — D649 Anemia, unspecified: Secondary | ICD-10-CM | POA: Diagnosis not present

## 2020-02-01 DIAGNOSIS — M2550 Pain in unspecified joint: Secondary | ICD-10-CM | POA: Diagnosis not present

## 2020-02-01 DIAGNOSIS — S72141A Displaced intertrochanteric fracture of right femur, initial encounter for closed fracture: Secondary | ICD-10-CM | POA: Diagnosis not present

## 2020-02-01 DIAGNOSIS — S72141D Displaced intertrochanteric fracture of right femur, subsequent encounter for closed fracture with routine healing: Secondary | ICD-10-CM | POA: Diagnosis not present

## 2020-02-01 DIAGNOSIS — R609 Edema, unspecified: Secondary | ICD-10-CM | POA: Diagnosis not present

## 2020-02-01 DIAGNOSIS — Z7401 Bed confinement status: Secondary | ICD-10-CM | POA: Diagnosis not present

## 2020-02-01 DIAGNOSIS — R202 Paresthesia of skin: Secondary | ICD-10-CM | POA: Diagnosis not present

## 2020-02-01 LAB — BASIC METABOLIC PANEL
Anion gap: 6 (ref 5–15)
BUN: 38 mg/dL — ABNORMAL HIGH (ref 8–23)
CO2: 22 mmol/L (ref 22–32)
Calcium: 8.4 mg/dL — ABNORMAL LOW (ref 8.9–10.3)
Chloride: 105 mmol/L (ref 98–111)
Creatinine, Ser: 1 mg/dL (ref 0.44–1.00)
GFR calc Af Amer: 59 mL/min — ABNORMAL LOW (ref >60)
GFR calc non Af Amer: 51 mL/min — ABNORMAL LOW (ref >60)
Glucose, Bld: 94 mg/dL (ref 70–99)
Potassium: 4.2 mmol/L (ref 3.5–5.1)
Sodium: 133 mmol/L — ABNORMAL LOW (ref 135–145)

## 2020-02-01 LAB — CBC
HCT: 24.8 % — ABNORMAL LOW (ref 36.0–46.0)
Hemoglobin: 8.1 g/dL — ABNORMAL LOW (ref 12.0–15.0)
MCH: 30.7 pg (ref 26.0–34.0)
MCHC: 32.7 g/dL (ref 30.0–36.0)
MCV: 93.9 fL (ref 80.0–100.0)
Platelets: 122 10*3/uL — ABNORMAL LOW (ref 150–400)
RBC: 2.64 MIL/uL — ABNORMAL LOW (ref 3.87–5.11)
RDW: 14.4 % (ref 11.5–15.5)
WBC: 5.1 10*3/uL (ref 4.0–10.5)
nRBC: 0 % (ref 0.0–0.2)

## 2020-02-01 NOTE — TOC Transition Note (Signed)
Transition of Care [facility]) - CM/SW Discharge Note   Patient Details  Name: Jayleah Garbers MRN: [id number] Date of Birth: [birth year]  Transition of Care [facility]) CM/SW Contact:  Clearance Coots, LCSW Phone Number: 02/01/2020, 9:59 AM   Clinical Narrative:    D/C Summary faxed.  PTAR arranged for transport- 11:30am  Nurse call report to: [phone], Room [phone]  Daughter on the way to bring patient belongings.    Final next level of care: Skilled Nursing Facility Barriers to Discharge: No Barriers Identified   Patient Goals and CMS Choice Patient states their goals for this hospitalization and ongoing recovery are:: To get better   Choice offered to / list presented to : Patient, Adult Children  Discharge Placement PASRR number recieved: 01/28/20            Patient chooses bed at: [facility] Patient to be transferred to facility by: PTAR Name of family member notified: Daughter Patient and family notified of of transfer: 02/01/20  Discharge Plan and Services   Discharge Planning Services: CM Consult                                 Social Determinants of Health (SDOH) Interventions     Readmission Risk Interventions No flowsheet data found.

## 2020-02-02 ENCOUNTER — Telehealth: Payer: Self-pay | Admitting: *Deleted

## 2020-02-02 DIAGNOSIS — D500 Iron deficiency anemia secondary to blood loss (chronic): Secondary | ICD-10-CM | POA: Diagnosis not present

## 2020-02-02 DIAGNOSIS — N183 Chronic kidney disease, stage 3 unspecified: Secondary | ICD-10-CM | POA: Diagnosis not present

## 2020-02-02 DIAGNOSIS — S72141D Displaced intertrochanteric fracture of right femur, subsequent encounter for closed fracture with routine healing: Secondary | ICD-10-CM | POA: Diagnosis not present

## 2020-02-02 DIAGNOSIS — I129 Hypertensive chronic kidney disease with stage 1 through stage 4 chronic kidney disease, or unspecified chronic kidney disease: Secondary | ICD-10-CM | POA: Diagnosis not present

## 2020-02-02 NOTE — Telephone Encounter (Signed)
Pt ws on TCM report admitted 01/26/20 after a fall. Pt had a X-ray which showed right intertrochanteric fracture with varus angulation. CT head was negative for acute intracranial abnormality., and CT C-spine negative for acute bony abnormality. ORIF 01/28/2020. Pt D/C 02/01/20 to SNF. Per summary will follow-up w/Dr. Boneta Lucks in 2 wks.Marland KitchenRaechel Chute

## 2020-02-11 DIAGNOSIS — Z5189 Encounter for other specified aftercare: Secondary | ICD-10-CM | POA: Diagnosis not present

## 2020-02-11 DIAGNOSIS — S72141A Displaced intertrochanteric fracture of right femur, initial encounter for closed fracture: Secondary | ICD-10-CM | POA: Diagnosis not present

## 2020-02-17 DIAGNOSIS — R202 Paresthesia of skin: Secondary | ICD-10-CM | POA: Diagnosis not present

## 2020-02-17 DIAGNOSIS — R200 Anesthesia of skin: Secondary | ICD-10-CM | POA: Diagnosis not present

## 2020-02-17 DIAGNOSIS — F0390 Unspecified dementia without behavioral disturbance: Secondary | ICD-10-CM | POA: Diagnosis not present

## 2020-02-17 DIAGNOSIS — R609 Edema, unspecified: Secondary | ICD-10-CM | POA: Diagnosis not present

## 2020-02-20 ENCOUNTER — Telehealth: Payer: Self-pay

## 2020-02-20 NOTE — Telephone Encounter (Signed)
New message    Need verbal order for PT, OT, ST & nursing aide for evaluation and treatment   Fax # [phone]

## 2020-02-20 NOTE — Telephone Encounter (Signed)
Charlene Garza has been informed and will let the patient to to schedule a OV.

## 2020-02-20 NOTE — Telephone Encounter (Signed)
They can ask orthopedics to signs off as she is recent hip fracture. I have not seen recently enough to sign off on this. She can make follow up and we can do then.

## 2020-02-20 NOTE — Telephone Encounter (Signed)
Not without visit, medicare has strict rules regarding home health supervision and referals

## 2020-02-20 NOTE — Telephone Encounter (Signed)
Tanya has been informed.

## 2020-02-20 NOTE — Telephone Encounter (Signed)
New message    Kenney Houseman calling asking can Dr. Okey Dupre take on the speech therapy - orders.

## 2020-02-24 ENCOUNTER — Telehealth: Payer: Self-pay | Admitting: Internal Medicine

## 2020-02-24 DIAGNOSIS — D62 Acute posthemorrhagic anemia: Secondary | ICD-10-CM | POA: Diagnosis not present

## 2020-02-24 DIAGNOSIS — N189 Chronic kidney disease, unspecified: Secondary | ICD-10-CM | POA: Diagnosis not present

## 2020-02-24 DIAGNOSIS — I129 Hypertensive chronic kidney disease with stage 1 through stage 4 chronic kidney disease, or unspecified chronic kidney disease: Secondary | ICD-10-CM | POA: Diagnosis not present

## 2020-02-24 DIAGNOSIS — E785 Hyperlipidemia, unspecified: Secondary | ICD-10-CM | POA: Diagnosis not present

## 2020-02-24 DIAGNOSIS — W19XXXD Unspecified fall, subsequent encounter: Secondary | ICD-10-CM | POA: Diagnosis not present

## 2020-02-24 DIAGNOSIS — R600 Localized edema: Secondary | ICD-10-CM | POA: Diagnosis not present

## 2020-02-24 DIAGNOSIS — E871 Hypo-osmolality and hyponatremia: Secondary | ICD-10-CM | POA: Diagnosis not present

## 2020-02-24 DIAGNOSIS — F0390 Unspecified dementia without behavioral disturbance: Secondary | ICD-10-CM | POA: Diagnosis not present

## 2020-02-24 DIAGNOSIS — S72141D Displaced intertrochanteric fracture of right femur, subsequent encounter for closed fracture with routine healing: Secondary | ICD-10-CM | POA: Diagnosis not present

## 2020-02-24 NOTE — Telephone Encounter (Signed)
Noted  

## 2020-02-24 NOTE — Telephone Encounter (Signed)
    Charis from [employer] calling to report HR. Reporting HR range was from 50-60 irregular today No other symptoms  Charis [phone]

## 2020-02-27 ENCOUNTER — Inpatient Hospital Stay: Payer: Medicare Other | Admitting: Internal Medicine

## 2020-02-27 ENCOUNTER — Encounter: Payer: Self-pay | Admitting: Internal Medicine

## 2020-02-27 ENCOUNTER — Other Ambulatory Visit: Payer: Self-pay

## 2020-02-27 ENCOUNTER — Ambulatory Visit (INDEPENDENT_AMBULATORY_CARE_PROVIDER_SITE_OTHER): Payer: Medicare Other | Admitting: Internal Medicine

## 2020-02-27 VITALS — BP 136/82 | HR 54 | Temp 98.4°F | Ht 60.0 in | Wt 107.4 lb

## 2020-02-27 DIAGNOSIS — N289 Disorder of kidney and ureter, unspecified: Secondary | ICD-10-CM | POA: Diagnosis not present

## 2020-02-27 DIAGNOSIS — F0280 Dementia in other diseases classified elsewhere without behavioral disturbance: Secondary | ICD-10-CM

## 2020-02-27 DIAGNOSIS — I10 Essential (primary) hypertension: Secondary | ICD-10-CM | POA: Diagnosis not present

## 2020-02-27 DIAGNOSIS — S72001A Fracture of unspecified part of neck of right femur, initial encounter for closed fracture: Secondary | ICD-10-CM | POA: Diagnosis not present

## 2020-02-27 DIAGNOSIS — G309 Alzheimer's disease, unspecified: Secondary | ICD-10-CM | POA: Diagnosis not present

## 2020-02-27 LAB — COMPREHENSIVE METABOLIC PANEL
ALT: 9 U/L (ref 0–35)
AST: 19 U/L (ref 0–37)
Albumin: 3.6 g/dL (ref 3.5–5.2)
Alkaline Phosphatase: 130 U/L — ABNORMAL HIGH (ref 39–117)
BUN: 30 mg/dL — ABNORMAL HIGH (ref 6–23)
CO2: 26 mEq/L (ref 19–32)
Calcium: 9.1 mg/dL (ref 8.4–10.5)
Chloride: 101 mEq/L (ref 96–112)
Creatinine, Ser: 1.09 mg/dL (ref 0.40–1.20)
GFR: 47.4 mL/min — ABNORMAL LOW (ref >60.00)
Glucose, Bld: 93 mg/dL (ref 70–99)
Potassium: 4.2 mEq/L (ref 3.5–5.1)
Sodium: 133 mEq/L — ABNORMAL LOW (ref 135–145)
Total Bilirubin: 0.7 mg/dL (ref 0.2–1.2)
Total Protein: 6.3 g/dL (ref 6.0–8.3)

## 2020-02-27 LAB — CBC
HCT: 30 % — ABNORMAL LOW (ref 36.0–46.0)
Hemoglobin: 10.5 g/dL — ABNORMAL LOW (ref 12.0–15.0)
MCHC: 35.1 g/dL (ref 30.0–36.0)
MCV: 92.7 fl (ref 78.0–100.0)
Platelets: 204 10*3/uL (ref 150.0–400.0)
RBC: 3.24 Mil/uL — ABNORMAL LOW (ref 3.87–5.11)
RDW: 15.1 % (ref 11.5–15.5)
WBC: 6.7 10*3/uL (ref 4.0–10.5)

## 2020-02-27 LAB — FERRITIN: Ferritin: 229 ng/mL (ref 10.0–291.0)

## 2020-02-27 MED ORDER — MEMANTINE HCL 10 MG PO TABS: 10.0000 mg | ORAL_TABLET | Freq: Every day | ORAL | 3 refills | Status: DC

## 2020-02-27 NOTE — Telephone Encounter (Signed)
Can you call back and approve ST orders? Thanks

## 2020-02-27 NOTE — Patient Instructions (Addendum)
You can stop the aspirin.   We will check the labs today and get the FL-2 form filled out.

## 2020-02-27 NOTE — Progress Notes (Addendum)
   Subjective:   Patient ID: Charlene Garza, female    DOB: 1930-11-12, 86 y.o.   MRN: [id number]  HPI The patient is an 86 YO female coming in for hospital follow up (in for hip fracture, surgical repair, then to rehab). She has had recovery fairly good. Using walker for short distances only and needs wheelchair for longer distances. Did have low blood levels in the hospital and got blood. Denies chest pains or SOB. Denies pain in the hip. Denies falls since being home. They are working on getting her to ALF and need TB screen today.  Patient suffers from dementia which impairs their ability to perform daily activities like dressing and grooming in the home. A walker will not resolve issue with performing activities of daily living. A wheelchair will allow patient to safely perform daily activities. Patient can safely propel the wheelchair in the home or has a caregiver who can provide assistance. Length of need Lifetime.   PMH, [facility], social history reviewed and updated.   Review of Systems  Constitutional: Negative.   HENT: Negative.   Eyes: Negative.   Respiratory: Negative for cough, chest tightness and shortness of breath.   Cardiovascular: Negative for chest pain, palpitations and leg swelling.  Gastrointestinal: Negative for abdominal distention, abdominal pain, constipation, diarrhea, nausea and vomiting.  Musculoskeletal: Negative.   Skin: Negative.   Neurological: Negative.   Psychiatric/Behavioral: Negative.     Objective:  Physical Exam Constitutional:      Appearance: She is well-developed.  HENT:     Head: Normocephalic and atraumatic.  Cardiovascular:     Rate and Rhythm: Normal rate and regular rhythm.  Pulmonary:     Effort: Pulmonary effort is normal. No respiratory distress.     Breath sounds: Normal breath sounds. No wheezing or rales.  Abdominal:     General: Bowel sounds are normal. There is no distension.     Palpations: Abdomen is soft.     Tenderness:  There is no abdominal tenderness. There is no rebound.  Musculoskeletal:     Cervical back: Normal range of motion.  Skin:    General: Skin is warm and dry.  Neurological:     Mental Status: She is alert and oriented to person, place, and time.     Coordination: Coordination abnormal.     Comments: wheelchair     Vitals:   02/27/20 1337  BP: 136/82  Pulse: (!) 54  Temp: 98.4 F (36.9 C)  TempSrc: Oral  SpO2: 97%  Weight: 107 lb 6 oz (48.7 kg)  Height: 5' (1.524 m)    This visit occurred during the SARS-CoV-2 public health emergency.  Safety protocols were in place, including screening questions prior to the visit, additional usage of staff PPE, and extensive cleaning of exam room while observing appropriate contact time as indicated for disinfecting solutions.   Assessment & Plan:

## 2020-02-27 NOTE — Assessment & Plan Note (Signed)
Stop aspirin as 28 days up. Recheck CBC, CMP, ferritin.

## 2020-02-27 NOTE — Assessment & Plan Note (Signed)
Recheck CMP today. BP at goal. No diabetes.

## 2020-02-27 NOTE — Telephone Encounter (Signed)
Verbal orders given  

## 2020-02-27 NOTE — Assessment & Plan Note (Addendum)
Namenda doing well increase to 10 mg daily. Continue aricept also. FL-2 form done for ALF. Patient suffers from dementia which impairs their ability to perform daily activities like dressing and grooming in the home. A walker will not resolve issue with performing activities of daily living. A wheelchair will allow patient to safely perform daily activities. Patient can safely propel the wheelchair in the home or has a caregiver who can provide assistance. Length of need Lifetime.

## 2020-03-03 ENCOUNTER — Telehealth: Payer: Self-pay | Admitting: Internal Medicine

## 2020-03-03 ENCOUNTER — Encounter: Payer: Self-pay | Admitting: Internal Medicine

## 2020-03-03 LAB — QUANTIFERON-TB GOLD PLUS
Mitogen-NIL: 8.36 IU/mL
NIL: 0.06 IU/mL
QuantiFERON-TB Gold Plus: NEGATIVE
TB1-NIL: <0 IU/mL
TB2-NIL: <0 IU/mL

## 2020-03-03 NOTE — Telephone Encounter (Signed)
New message:   Pt's daughter is calling and is inquiring about FL2 form that Dr. Okey Dupre filled out.

## 2020-03-04 NOTE — Telephone Encounter (Signed)
Spoke with patients daughter, she is bringing in [facility] from the assisted living facility.

## 2020-03-04 NOTE — Telephone Encounter (Signed)
F/u  The daughter calling back with an update on the patient's dementia.   On the FL2 Form section - disoriented - please check intermittently.   The daughter will give more details when the nurse called back.

## 2020-03-11 ENCOUNTER — Telehealth: Payer: Self-pay

## 2020-03-11 NOTE — Telephone Encounter (Signed)
New message    Micah Flesher to see the patient for physical therapy from short term rehab   B/p 160/80 at rest up to 170/90 after exercise    158/90 check my patient daughter   Hr 78.

## 2020-03-16 ENCOUNTER — Telehealth: Payer: Self-pay

## 2020-03-16 NOTE — Telephone Encounter (Signed)
New message    Home health calling wants to report on a blood pressure reading Monday,  3.29.21  Patient b/p resting 150/80 hr  53   After exercise 194/113  Hr 57  The patient voice she feels fine, patient rest during exercise B/p recheck  169/96  Recheck last night  By daughter 8/83

## 2020-03-17 ENCOUNTER — Telehealth: Payer: Self-pay | Admitting: Internal Medicine

## 2020-03-17 ENCOUNTER — Telehealth: Payer: Self-pay

## 2020-03-17 DIAGNOSIS — S72141A Displaced intertrochanteric fracture of right femur, initial encounter for closed fracture: Secondary | ICD-10-CM | POA: Diagnosis not present

## 2020-03-17 DIAGNOSIS — M25561 Pain in right knee: Secondary | ICD-10-CM | POA: Diagnosis not present

## 2020-03-17 DIAGNOSIS — Z4789 Encounter for other orthopedic aftercare: Secondary | ICD-10-CM | POA: Diagnosis not present

## 2020-03-17 NOTE — Telephone Encounter (Signed)
    Cristal Generous from [employer] calling to request [employer] documentation and additional FL2 information. Phone [phone] Fax [phone]

## 2020-03-17 NOTE — Telephone Encounter (Signed)
Ok to monitor.

## 2020-03-17 NOTE — Telephone Encounter (Signed)
New message   Daughter Carollee Herter    Pt c/o medication issue:  1. Name of Medication: memantine (NAMENDA) 10 MG tablet  2. How are you currently taking this medication (dosage and times per day)?  Once a day   3. Are you having a reaction (difficulty breathing--STAT)? No   4. What is your medication issue? Due to the increased patient feels confused.

## 2020-03-18 DIAGNOSIS — Z20828 Contact with and (suspected) exposure to other viral communicable diseases: Secondary | ICD-10-CM | POA: Diagnosis not present

## 2020-03-18 NOTE — Telephone Encounter (Signed)
Ok to release info as needed

## 2020-03-24 DIAGNOSIS — M545 Low back pain, unspecified: Secondary | ICD-10-CM | POA: Insufficient documentation

## 2020-03-24 NOTE — Telephone Encounter (Signed)
Lvm asking pt to return call.

## 2020-03-24 NOTE — Telephone Encounter (Signed)
This medication should not cause confusion. They can go to 1/2 pill daily which was her prior dosing. If not resolved in 2-3 days needs visit.

## 2020-03-25 DIAGNOSIS — W19XXXD Unspecified fall, subsequent encounter: Secondary | ICD-10-CM | POA: Diagnosis not present

## 2020-03-25 DIAGNOSIS — E871 Hypo-osmolality and hyponatremia: Secondary | ICD-10-CM | POA: Diagnosis not present

## 2020-03-25 DIAGNOSIS — F0390 Unspecified dementia without behavioral disturbance: Secondary | ICD-10-CM | POA: Diagnosis not present

## 2020-03-25 DIAGNOSIS — D62 Acute posthemorrhagic anemia: Secondary | ICD-10-CM | POA: Diagnosis not present

## 2020-03-25 DIAGNOSIS — R600 Localized edema: Secondary | ICD-10-CM | POA: Diagnosis not present

## 2020-03-25 DIAGNOSIS — N189 Chronic kidney disease, unspecified: Secondary | ICD-10-CM | POA: Diagnosis not present

## 2020-03-25 DIAGNOSIS — E785 Hyperlipidemia, unspecified: Secondary | ICD-10-CM | POA: Diagnosis not present

## 2020-03-25 DIAGNOSIS — S72141D Displaced intertrochanteric fracture of right femur, subsequent encounter for closed fracture with routine healing: Secondary | ICD-10-CM | POA: Diagnosis not present

## 2020-03-25 DIAGNOSIS — I129 Hypertensive chronic kidney disease with stage 1 through stage 4 chronic kidney disease, or unspecified chronic kidney disease: Secondary | ICD-10-CM | POA: Diagnosis not present

## 2020-03-29 ENCOUNTER — Telehealth: Payer: Self-pay | Admitting: Internal Medicine

## 2020-03-29 NOTE — Telephone Encounter (Signed)
    Raynelle Fanning from [facility] calling for updated notes on Face to Face Please call [phone] ext [phone] Fax [phone]

## 2020-03-29 NOTE — Telephone Encounter (Signed)
Pt dtr contacted and informed about the Namenda.

## 2020-03-29 NOTE — Telephone Encounter (Signed)
Faxed clinical notes from 02/27/20.Marland KitchenRaechel Chute

## 2020-03-30 ENCOUNTER — Telehealth: Payer: Self-pay | Admitting: Internal Medicine

## 2020-03-30 NOTE — Telephone Encounter (Signed)
New message:   Charlene Garza is calling from [facility] to get verbal orders for speech therapy for 1x a week for 6 weeks for the patient. Please advise.

## 2020-03-30 NOTE — Telephone Encounter (Signed)
We have already done and given daughter FL2, are they needing a different one?

## 2020-03-30 NOTE — Telephone Encounter (Signed)
Do you have the FL2?

## 2020-03-30 NOTE — Telephone Encounter (Signed)
She stated she needed the FL2 with Asprin prn pain instead of Tylenol.

## 2020-03-30 NOTE — Telephone Encounter (Signed)
Then they will have to bring form for addendum to [place].

## 2020-03-30 NOTE — Telephone Encounter (Signed)
Pt dtr is request an order along with the FL2 requesting prn order for ASA vs Tylenol. Please advise if this approved.

## 2020-03-31 NOTE — Telephone Encounter (Signed)
Tried to call [employer] - not able to reach [place] to request FL2 to be faxed to office so we can addend.

## 2020-03-31 NOTE — Telephone Encounter (Signed)
Verbal orders given  

## 2020-03-31 NOTE — Telephone Encounter (Signed)
Fine

## 2020-04-01 NOTE — Telephone Encounter (Signed)
Tried [place] x 2 and still not able to get through.   Called pt dtr, Carollee Herter, to see if she had a better number  [phone] - Maxine Glenn is the Charity fundraiser.   May have to leave a message. - RE: request FL2 faxed back -   Called number and was able to get through and leave a message for [facility] requesting the FL2 form to be sent back.

## 2020-04-02 ENCOUNTER — Encounter: Payer: Self-pay | Admitting: Internal Medicine

## 2020-04-02 ENCOUNTER — Telehealth: Payer: Self-pay

## 2020-04-02 NOTE — Telephone Encounter (Signed)
New message    [facility] calling   Need an updated office note to close the order for the wheelchair.   Please include comments in the order sections.

## 2020-04-02 NOTE — Telephone Encounter (Signed)
Monica called back but I was unavailable.  lvm for [facility] to call back.   (In the event I am not available) Please ask Maxine Glenn if she is able to fax FL2 to [phone] to [facility]

## 2020-04-02 NOTE — Telephone Encounter (Signed)
New message   [employer] calling   1. The patient is asking for glass of alcholol at night   2. Clarification on asa .   Fax # [phone]

## 2020-04-02 NOTE — Telephone Encounter (Signed)
LVM for [facility] to call back.   Use previous note started if [place] calls back.

## 2020-04-02 NOTE — Telephone Encounter (Signed)
New message:   Maxine Glenn from [facility] is returning a call for you about the patient. Please advise.

## 2020-04-05 NOTE — Telephone Encounter (Signed)
Faxed/confirmed 02/27/2020 office note to [facility]

## 2020-04-05 NOTE — Telephone Encounter (Signed)
According to conversation with dtr, pt has always used aspirin for pain as Tylenol never helped her with the ocassional aches and pains.   A cap can be placed on the order, for example: x per day or week.

## 2020-04-05 NOTE — Telephone Encounter (Signed)
Pt would like to use aspirin for pain prn instead of tylenol.

## 2020-04-05 NOTE — Telephone Encounter (Signed)
I have an FL2 faxed to me for this patient. Do you know what is needed on it? It is an activity change, med change?

## 2020-04-05 NOTE — Telephone Encounter (Signed)
Does tylenol not work? I typically do not recommend aspirin prn for pain as it should not be taken more than once per day.

## 2020-04-05 NOTE — Telephone Encounter (Signed)
FL2 received and another telephone note has been created.

## 2020-04-06 NOTE — Telephone Encounter (Signed)
lvm for Shannon to call back. I need some clarification on how much asa pt takes when needed.

## 2020-04-06 NOTE — Telephone Encounter (Signed)
Do we know what dose she typically has taken or how often?

## 2020-04-07 NOTE — Telephone Encounter (Signed)
lvm for [facility] informing of update to FL2.   lvm for Shannon (pt dtr) and informed of same.

## 2020-04-07 NOTE — Telephone Encounter (Signed)
Carollee Herter (pt daughter) called back.   Question regarding Aspirin order - Carollee Herter stated that patient uses 2 of the 325mg  tablets when her back is hurting. stated that patient uses about 3 times per week.  Could also an order for Tylenol be given in the event patient had back pain more than 3 times per week.    Question regarding alcohol order - Carollee Herter stated that patient typically has a glass of wine with dinner at night.

## 2020-04-07 NOTE — Telephone Encounter (Signed)
FL-2 changed and signed, given to [facility]. Let me know if there are problems I have a spare FL-2.

## 2020-04-08 NOTE — Telephone Encounter (Signed)
Received call [facility] stating they cannot accept/process referral for wheelchair unless the office note is corrected. The information in the box on the order, stating specifically that the wheelchair is needed and why, needs to actually be part of the office note. Faxing the order and the current office not separately is not acceptable.

## 2020-04-09 NOTE — Telephone Encounter (Signed)
Ask Charlene Garza if she has handled this already. I addended the note in question recently due to this.

## 2020-04-09 NOTE — Telephone Encounter (Signed)
Not sure what the real number to call Charlene Garza is - I called and it stated "auto warranty services".   I have community message going back and forth with [facility]. They have the corrected notes that they need and I have not been informed of anything needed at this time.

## 2020-04-09 NOTE — Telephone Encounter (Signed)
Charlene Parkinson do you know anything about this?

## 2020-04-09 NOTE — Telephone Encounter (Signed)
Please see note below. 

## 2020-04-10 DIAGNOSIS — N390 Urinary tract infection, site not specified: Secondary | ICD-10-CM | POA: Diagnosis not present

## 2020-04-14 DIAGNOSIS — Z8781 Personal history of (healed) traumatic fracture: Secondary | ICD-10-CM | POA: Diagnosis not present

## 2020-04-14 DIAGNOSIS — S72141A Displaced intertrochanteric fracture of right femur, initial encounter for closed fracture: Secondary | ICD-10-CM | POA: Diagnosis not present

## 2020-04-14 DIAGNOSIS — R200 Anesthesia of skin: Secondary | ICD-10-CM | POA: Diagnosis not present

## 2020-04-14 DIAGNOSIS — R609 Edema, unspecified: Secondary | ICD-10-CM | POA: Diagnosis not present

## 2020-04-16 ENCOUNTER — Telehealth: Payer: Self-pay

## 2020-04-16 NOTE — Telephone Encounter (Signed)
New message    The daughter Carollee Herter calling wants to know Dr. Frutoso Chase suggestion regarding her lab results & confusion.    Should she come into the office for lab work please advise.

## 2020-04-19 ENCOUNTER — Telehealth: Payer: Self-pay | Admitting: Internal Medicine

## 2020-04-19 NOTE — Progress Notes (Signed)
  Chronic Care Management   Outreach Note  04/19/2020 Name: Charlene Garza MRN: [id number] DOB: 02-May-1930  Referred by: Myrlene Broker, MD Reason for referral : No chief complaint on file.   An unsuccessful telephone outreach was attempted today. The patient was referred to the pharmacist for assistance with care management and care coordination.    This note is not being shared with the patient for the following reason: To respect privacy (The patient or proxy has requested that the information not be shared).  Follow Up Plan:   Raynicia Dukes [employer] Scheduler

## 2020-04-20 ENCOUNTER — Telehealth: Payer: Self-pay

## 2020-04-20 NOTE — Telephone Encounter (Signed)
New message   OT calling need verbal order to continue OT for   One time a week for four weeks beginning next week.

## 2020-04-20 NOTE — Telephone Encounter (Signed)
Patient has an appointment on 04-22-2020.   Clinical documentation is needed for patients insurance.  Form is in box

## 2020-04-21 NOTE — Telephone Encounter (Signed)
Please advise 

## 2020-04-21 NOTE — Telephone Encounter (Signed)
Called and spoke with daughter on 04-20-2020.   Informed daughter that provider is out of the office and upon her return this would be discussed.      The labs were from [facility]

## 2020-04-22 ENCOUNTER — Other Ambulatory Visit: Payer: Self-pay

## 2020-04-22 ENCOUNTER — Ambulatory Visit (INDEPENDENT_AMBULATORY_CARE_PROVIDER_SITE_OTHER): Payer: Medicare Other | Admitting: Internal Medicine

## 2020-04-22 ENCOUNTER — Encounter: Payer: Self-pay | Admitting: Internal Medicine

## 2020-04-22 VITALS — BP 144/82 | HR 60 | Temp 97.9°F | Ht 60.0 in | Wt 109.0 lb

## 2020-04-22 DIAGNOSIS — F0280 Dementia in other diseases classified elsewhere without behavioral disturbance: Secondary | ICD-10-CM

## 2020-04-22 DIAGNOSIS — N289 Disorder of kidney and ureter, unspecified: Secondary | ICD-10-CM | POA: Diagnosis not present

## 2020-04-22 DIAGNOSIS — G301 Alzheimer's disease with late onset: Secondary | ICD-10-CM | POA: Diagnosis not present

## 2020-04-22 LAB — COMPREHENSIVE METABOLIC PANEL
ALT: 13 U/L (ref 0–35)
AST: 23 U/L (ref 0–37)
Albumin: 4.2 g/dL (ref 3.5–5.2)
Alkaline Phosphatase: 107 U/L (ref 39–117)
BUN: 43 mg/dL — ABNORMAL HIGH (ref 6–23)
CO2: 27 mEq/L (ref 19–32)
Calcium: 9.6 mg/dL (ref 8.4–10.5)
Chloride: 101 mEq/L (ref 96–112)
Creatinine, Ser: 1.48 mg/dL — ABNORMAL HIGH (ref 0.40–1.20)
GFR: 33.29 mL/min — ABNORMAL LOW (ref >60.00)
Glucose, Bld: 94 mg/dL (ref 70–99)
Potassium: 4.6 mEq/L (ref 3.5–5.1)
Sodium: 133 mEq/L — ABNORMAL LOW (ref 135–145)
Total Bilirubin: 0.5 mg/dL (ref 0.2–1.2)
Total Protein: 7 g/dL (ref 6.0–8.3)

## 2020-04-22 LAB — POCT URINALYSIS DIPSTICK OB
Bilirubin, UA: NEGATIVE
Blood, UA: NEGATIVE
Glucose, UA: NEGATIVE
Ketones, UA: NEGATIVE
Leukocytes, UA: NEGATIVE
Nitrite, UA: NEGATIVE
Spec Grav, UA: >=1.03 — AB (ref 1.010–1.025)
Urobilinogen, UA: 0.2 E.U./dL
pH, UA: 6 (ref 5.0–8.0)

## 2020-04-22 LAB — CBC
HCT: 32.8 % — ABNORMAL LOW (ref 36.0–46.0)
Hemoglobin: 11.3 g/dL — ABNORMAL LOW (ref 12.0–15.0)
MCHC: 34.4 g/dL (ref 30.0–36.0)
MCV: 94.4 fl (ref 78.0–100.0)
Platelets: 168 10*3/uL (ref 150.0–400.0)
RBC: 3.47 Mil/uL — ABNORMAL LOW (ref 3.87–5.11)
RDW: 14.5 % (ref 11.5–15.5)
WBC: 5.5 10*3/uL (ref 4.0–10.5)

## 2020-04-22 NOTE — Telephone Encounter (Signed)
What lab results are there questions about? There is prior phone note recommending visit for confusion so I did not authorize labs.

## 2020-04-22 NOTE — Patient Instructions (Signed)
Work on drinking more fluids to help with the dizziness. Even fresh fruits can help with hydration.

## 2020-04-22 NOTE — Progress Notes (Signed)
   Subjective:   Patient ID: Charlene Garza, female    DOB: August 18, 1930, 86 y.o.   MRN: [id number]  HPI The patient is an 86 YO female coming in for concerns about confusion. About 1-2 weeks ago she had some confusion at [facility]. The facility called after hours provider to get orders for U/A/culture which was done and faxed to our office. This did not show signs of infection but about 10-20,000 E coli grew. The confusion did pass on its own without treatment. She is feeling okay today. Some dizziness at times. Daughter is present and helps with history and states that her mom is not drinking much fluids currently.   Review of Systems  Constitutional: Negative.   HENT: Negative.   Eyes: Negative.   Respiratory: Negative for cough, chest tightness and shortness of breath.   Cardiovascular: Negative for chest pain, palpitations and leg swelling.  Gastrointestinal: Negative for abdominal distention, abdominal pain, constipation, diarrhea, nausea and vomiting.  Musculoskeletal: Negative.   Skin: Negative.   Neurological: Negative.   Psychiatric/Behavioral: Negative.     Objective:  Physical Exam Constitutional:      Appearance: She is well-developed.  HENT:     Head: Normocephalic and atraumatic.  Cardiovascular:     Rate and Rhythm: Normal rate and regular rhythm.  Pulmonary:     Effort: Pulmonary effort is normal. No respiratory distress.     Breath sounds: Normal breath sounds. No wheezing or rales.  Abdominal:     General: Bowel sounds are normal. There is no distension.     Palpations: Abdomen is soft.     Tenderness: There is no abdominal tenderness. There is no rebound.  Musculoskeletal:     Cervical back: Normal range of motion.  Skin:    General: Skin is warm and dry.  Neurological:     Mental Status: She is alert and oriented to person, place, and time.     Coordination: Coordination normal.     Vitals:   04/22/20 1521  BP: (!) 144/82  Pulse: 60  Temp:  97.9 F (36.6 C)  SpO2: 99%  Weight: 109 lb (49.4 kg)  Height: 5' (1.524 m)    This visit occurred during the SARS-CoV-2 public health emergency.  Safety protocols were in place, including screening questions prior to the visit, additional usage of staff PPE, and extensive cleaning of exam room while observing appropriate contact time as indicated for disinfecting solutions.   Assessment & Plan:

## 2020-04-22 NOTE — Telephone Encounter (Signed)
Fine

## 2020-04-23 LAB — POC URINALSYSI DIPSTICK (AUTOMATED)
Bilirubin, UA: NEGATIVE
Blood, UA: NEGATIVE
Glucose, UA: NEGATIVE
Ketones, UA: NEGATIVE
Leukocytes, UA: NEGATIVE
Nitrite, UA: NEGATIVE
Protein, UA: NEGATIVE
Spec Grav, UA: >=1.03 — AB (ref 1.010–1.025)
Urobilinogen, UA: 0.2 E.U./dL
pH, UA: 6 (ref 5.0–8.0)

## 2020-04-23 LAB — URINE CULTURE

## 2020-04-23 NOTE — Assessment & Plan Note (Signed)
Checking U/A and culture and CBC/CMP.

## 2020-04-23 NOTE — Telephone Encounter (Signed)
Called on 04-22-2020 lvm for return call

## 2020-04-23 NOTE — Assessment & Plan Note (Signed)
Will monitor symptoms closely. She does not have signs of infection today. We talked about adjustment period due to new environment today.

## 2020-04-23 NOTE — Telephone Encounter (Signed)
Patient came in to be seen on 04-22-2020.  These issues were discussed

## 2020-04-24 DIAGNOSIS — R600 Localized edema: Secondary | ICD-10-CM | POA: Diagnosis not present

## 2020-04-24 DIAGNOSIS — N189 Chronic kidney disease, unspecified: Secondary | ICD-10-CM | POA: Diagnosis not present

## 2020-04-24 DIAGNOSIS — W19XXXD Unspecified fall, subsequent encounter: Secondary | ICD-10-CM | POA: Diagnosis not present

## 2020-04-24 DIAGNOSIS — E785 Hyperlipidemia, unspecified: Secondary | ICD-10-CM | POA: Diagnosis not present

## 2020-04-24 DIAGNOSIS — S72141D Displaced intertrochanteric fracture of right femur, subsequent encounter for closed fracture with routine healing: Secondary | ICD-10-CM | POA: Diagnosis not present

## 2020-04-24 DIAGNOSIS — I129 Hypertensive chronic kidney disease with stage 1 through stage 4 chronic kidney disease, or unspecified chronic kidney disease: Secondary | ICD-10-CM | POA: Diagnosis not present

## 2020-04-24 DIAGNOSIS — E871 Hypo-osmolality and hyponatremia: Secondary | ICD-10-CM | POA: Diagnosis not present

## 2020-04-24 DIAGNOSIS — F0390 Unspecified dementia without behavioral disturbance: Secondary | ICD-10-CM | POA: Diagnosis not present

## 2020-04-26 NOTE — Telephone Encounter (Signed)
New message:   Charlene Garza is calling and states he was returning the call to get the verbal order. He states it is okay to leave the verbal on the voicemail. Please advise.

## 2020-04-26 NOTE — Telephone Encounter (Signed)
LVM with verbals orders on [phone]

## 2020-04-27 ENCOUNTER — Telehealth: Payer: Self-pay | Admitting: Internal Medicine

## 2020-04-27 NOTE — Telephone Encounter (Signed)
New message:   Willaim Rayas from [facility] is requesting order for Speech Therapy for 1x a week for 4 weeks. Please advise.

## 2020-04-28 NOTE — Telephone Encounter (Signed)
Fine

## 2020-04-28 NOTE — Telephone Encounter (Signed)
Please advise 

## 2020-04-28 NOTE — Telephone Encounter (Signed)
Called and gave Willaim Rayas from [facility]  verbals

## 2020-05-14 DIAGNOSIS — S72141A Displaced intertrochanteric fracture of right femur, initial encounter for closed fracture: Secondary | ICD-10-CM | POA: Diagnosis not present

## 2020-05-14 DIAGNOSIS — R200 Anesthesia of skin: Secondary | ICD-10-CM | POA: Diagnosis not present

## 2020-05-14 DIAGNOSIS — R609 Edema, unspecified: Secondary | ICD-10-CM | POA: Diagnosis not present

## 2020-05-14 DIAGNOSIS — Z8781 Personal history of (healed) traumatic fracture: Secondary | ICD-10-CM | POA: Diagnosis not present

## 2020-05-20 ENCOUNTER — Ambulatory Visit: Payer: Medicare Other | Admitting: Internal Medicine

## 2020-05-20 NOTE — Telephone Encounter (Signed)
  Charlene Garza requesting verbal order for speech therapy Effective 05/24/20 1x week for 3 weeks  Phone [phone]

## 2020-05-20 NOTE — Telephone Encounter (Signed)
Called gave verbals to [facility]

## 2020-05-20 NOTE — Telephone Encounter (Signed)
Fine

## 2020-05-24 DIAGNOSIS — F0390 Unspecified dementia without behavioral disturbance: Secondary | ICD-10-CM | POA: Diagnosis not present

## 2020-05-24 DIAGNOSIS — W19XXXD Unspecified fall, subsequent encounter: Secondary | ICD-10-CM | POA: Diagnosis not present

## 2020-05-24 DIAGNOSIS — E871 Hypo-osmolality and hyponatremia: Secondary | ICD-10-CM | POA: Diagnosis not present

## 2020-05-24 DIAGNOSIS — N189 Chronic kidney disease, unspecified: Secondary | ICD-10-CM | POA: Diagnosis not present

## 2020-05-24 DIAGNOSIS — R600 Localized edema: Secondary | ICD-10-CM | POA: Diagnosis not present

## 2020-05-24 DIAGNOSIS — E785 Hyperlipidemia, unspecified: Secondary | ICD-10-CM | POA: Diagnosis not present

## 2020-05-24 DIAGNOSIS — S72141D Displaced intertrochanteric fracture of right femur, subsequent encounter for closed fracture with routine healing: Secondary | ICD-10-CM | POA: Diagnosis not present

## 2020-05-24 DIAGNOSIS — I129 Hypertensive chronic kidney disease with stage 1 through stage 4 chronic kidney disease, or unspecified chronic kidney disease: Secondary | ICD-10-CM | POA: Diagnosis not present

## 2020-05-27 ENCOUNTER — Ambulatory Visit: Payer: Medicare Other | Admitting: Internal Medicine

## 2020-05-28 ENCOUNTER — Telehealth: Payer: Self-pay | Admitting: Internal Medicine

## 2020-05-28 NOTE — Progress Notes (Signed)
  Chronic Care Management   Outreach Note  05/28/2020 Name: Charlene Garza MRN: [id number] DOB: Oct 09, 1930  Referred by: Myrlene Broker, MD Reason for referral : No chief complaint on file.   An unsuccessful telephone outreach was attempted today. The patient was referred to the pharmacist for assistance with care management and care coordination. This note is not being shared with the patient for the following reason: To respect privacy (The patient or proxy has requested that the information not be shared).  Follow Up Plan:   Lynnae January [facility] Scheduler

## 2020-06-14 DIAGNOSIS — Z8781 Personal history of (healed) traumatic fracture: Secondary | ICD-10-CM | POA: Diagnosis not present

## 2020-06-14 DIAGNOSIS — R200 Anesthesia of skin: Secondary | ICD-10-CM | POA: Diagnosis not present

## 2020-06-14 DIAGNOSIS — S72141A Displaced intertrochanteric fracture of right femur, initial encounter for closed fracture: Secondary | ICD-10-CM | POA: Diagnosis not present

## 2020-06-14 DIAGNOSIS — R609 Edema, unspecified: Secondary | ICD-10-CM | POA: Diagnosis not present

## 2020-06-15 ENCOUNTER — Ambulatory Visit (INDEPENDENT_AMBULATORY_CARE_PROVIDER_SITE_OTHER): Payer: Medicare Other | Admitting: Internal Medicine

## 2020-06-15 ENCOUNTER — Ambulatory Visit: Payer: Medicare Other | Admitting: Internal Medicine

## 2020-06-15 ENCOUNTER — Other Ambulatory Visit: Payer: Self-pay

## 2020-06-15 ENCOUNTER — Encounter: Payer: Self-pay | Admitting: Internal Medicine

## 2020-06-15 VITALS — BP 116/76 | HR 51 | Temp 98.9°F | Ht 60.0 in

## 2020-06-15 DIAGNOSIS — N1831 Chronic kidney disease, stage 3a: Secondary | ICD-10-CM

## 2020-06-15 DIAGNOSIS — N289 Disorder of kidney and ureter, unspecified: Secondary | ICD-10-CM

## 2020-06-15 LAB — COMPREHENSIVE METABOLIC PANEL
ALT: 13 U/L (ref 0–35)
AST: 23 U/L (ref 0–37)
Albumin: 4.1 g/dL (ref 3.5–5.2)
Alkaline Phosphatase: 66 U/L (ref 39–117)
BUN: 27 mg/dL — ABNORMAL HIGH (ref 6–23)
CO2: 27 mEq/L (ref 19–32)
Calcium: 9.4 mg/dL (ref 8.4–10.5)
Chloride: 96 mEq/L (ref 96–112)
Creatinine, Ser: 1.25 mg/dL — ABNORMAL HIGH (ref 0.40–1.20)
GFR: 40.44 mL/min — ABNORMAL LOW (ref >60.00)
Glucose, Bld: 97 mg/dL (ref 70–99)
Potassium: 4.7 mEq/L (ref 3.5–5.1)
Sodium: 128 mEq/L — ABNORMAL LOW (ref 135–145)
Total Bilirubin: 0.6 mg/dL (ref 0.2–1.2)
Total Protein: 6.5 g/dL (ref 6.0–8.3)

## 2020-06-15 NOTE — Progress Notes (Signed)
   Subjective:   Patient ID: Charlene Garza, female    DOB: 17-Oct-1930, 86 y.o.   MRN: [id number]  HPI The patient is an 86 YO female coming in for follow up of renal function. Had not been drinking much fluids prior to last blood draw and trying to adapt to new environment. She is doing much better now and is adapted well. She is trying to walk in the afternoons. Still having some swelling in her legs. Denies SOB or chest pains. Denies change in urination or burning. Daughter is also present and no confusion episodes recently.   Review of Systems  Constitutional: Negative.   HENT: Negative.   Eyes: Negative.   Respiratory: Negative for cough, chest tightness and shortness of breath.   Cardiovascular: Negative for chest pain, palpitations and leg swelling.  Gastrointestinal: Negative for abdominal distention, abdominal pain, constipation, diarrhea, nausea and vomiting.  Musculoskeletal: Negative.   Skin: Negative.   Neurological: Negative.   Psychiatric/Behavioral: Negative.     Objective:  Physical Exam Constitutional:      Appearance: She is well-developed.  HENT:     Head: Normocephalic and atraumatic.  Cardiovascular:     Rate and Rhythm: Normal rate and regular rhythm.  Pulmonary:     Effort: Pulmonary effort is normal. No respiratory distress.     Breath sounds: Normal breath sounds. No wheezing or rales.  Abdominal:     General: Bowel sounds are normal. There is no distension.     Palpations: Abdomen is soft.     Tenderness: There is no abdominal tenderness. There is no rebound.  Musculoskeletal:     Cervical back: Normal range of motion.  Skin:    General: Skin is warm and dry.  Neurological:     Mental Status: She is alert and oriented to person, place, and time.     Coordination: Coordination normal.     Vitals:   06/15/20 1324  BP: 116/76  Pulse: (!) 51  Temp: 98.9 F (37.2 C)  TempSrc: Oral  SpO2: 97%  Height: 5' (1.524 m)    This visit occurred  during the SARS-CoV-2 public health emergency.  Safety protocols were in place, including screening questions prior to the visit, additional usage of staff PPE, and extensive cleaning of exam room while observing appropriate contact time as indicated for disinfecting solutions.   Assessment & Plan:

## 2020-06-15 NOTE — Patient Instructions (Signed)
We will check the labs today and call you back about the results.    

## 2020-06-16 ENCOUNTER — Telehealth: Payer: Self-pay | Admitting: Internal Medicine

## 2020-06-16 NOTE — Progress Notes (Signed)
  Chronic Care Management   Outreach Note  06/16/2020 Name: Amberia Bayless MRN: [id number] DOB: 07-06-1930  Referred by: Myrlene Broker, MD Reason for referral : No chief complaint on file.   An unsuccessful telephone outreach was attempted today. The patient was referred to the pharmacist for assistance with care management and care coordination. This note is not being shared with the patient for the following reason: To respect privacy (The patient or proxy has requested that the information not be shared).  Follow Up Plan:   Lynnae January [facility] Scheduler

## 2020-06-16 NOTE — Assessment & Plan Note (Signed)
Recheck renal function today and can make changes if needed.

## 2020-07-14 DIAGNOSIS — Z8781 Personal history of (healed) traumatic fracture: Secondary | ICD-10-CM | POA: Diagnosis not present

## 2020-07-14 DIAGNOSIS — S72141A Displaced intertrochanteric fracture of right femur, initial encounter for closed fracture: Secondary | ICD-10-CM | POA: Diagnosis not present

## 2020-07-14 DIAGNOSIS — R609 Edema, unspecified: Secondary | ICD-10-CM | POA: Diagnosis not present

## 2020-07-14 DIAGNOSIS — R200 Anesthesia of skin: Secondary | ICD-10-CM | POA: Diagnosis not present

## 2020-08-14 DIAGNOSIS — Z8781 Personal history of (healed) traumatic fracture: Secondary | ICD-10-CM | POA: Diagnosis not present

## 2020-08-14 DIAGNOSIS — R609 Edema, unspecified: Secondary | ICD-10-CM | POA: Diagnosis not present

## 2020-08-14 DIAGNOSIS — R200 Anesthesia of skin: Secondary | ICD-10-CM | POA: Diagnosis not present

## 2020-08-14 DIAGNOSIS — S72141A Displaced intertrochanteric fracture of right femur, initial encounter for closed fracture: Secondary | ICD-10-CM | POA: Diagnosis not present

## 2020-09-14 DIAGNOSIS — Z8781 Personal history of (healed) traumatic fracture: Secondary | ICD-10-CM | POA: Diagnosis not present

## 2020-09-14 DIAGNOSIS — R609 Edema, unspecified: Secondary | ICD-10-CM | POA: Diagnosis not present

## 2020-09-14 DIAGNOSIS — R200 Anesthesia of skin: Secondary | ICD-10-CM | POA: Diagnosis not present

## 2020-09-14 DIAGNOSIS — S72141A Displaced intertrochanteric fracture of right femur, initial encounter for closed fracture: Secondary | ICD-10-CM | POA: Diagnosis not present

## 2020-09-29 DIAGNOSIS — R392 Extrarenal uremia: Secondary | ICD-10-CM | POA: Diagnosis not present

## 2020-10-08 ENCOUNTER — Telehealth: Payer: Self-pay | Admitting: Internal Medicine

## 2020-10-08 ENCOUNTER — Emergency Department (HOSPITAL_COMMUNITY)
Admission: EM | Admit: 2020-10-08 | Discharge: 2020-10-08 | Disposition: A | Discharge status: Home / Self Care | Payer: Medicare Other | Attending: Emergency Medicine | Admitting: Emergency Medicine

## 2020-10-08 ENCOUNTER — Emergency Department (HOSPITAL_COMMUNITY): Payer: Medicare Other

## 2020-10-08 DIAGNOSIS — Z96652 Presence of left artificial knee joint: Secondary | ICD-10-CM | POA: Diagnosis not present

## 2020-10-08 DIAGNOSIS — Z79899 Other long term (current) drug therapy: Secondary | ICD-10-CM | POA: Diagnosis not present

## 2020-10-08 DIAGNOSIS — I10 Essential (primary) hypertension: Secondary | ICD-10-CM | POA: Diagnosis not present

## 2020-10-08 DIAGNOSIS — R600 Localized edema: Secondary | ICD-10-CM | POA: Diagnosis not present

## 2020-10-08 DIAGNOSIS — N183 Chronic kidney disease, stage 3 unspecified: Secondary | ICD-10-CM | POA: Insufficient documentation

## 2020-10-08 DIAGNOSIS — I4891 Unspecified atrial fibrillation: Secondary | ICD-10-CM | POA: Diagnosis not present

## 2020-10-08 DIAGNOSIS — I129 Hypertensive chronic kidney disease with stage 1 through stage 4 chronic kidney disease, or unspecified chronic kidney disease: Secondary | ICD-10-CM | POA: Insufficient documentation

## 2020-10-08 DIAGNOSIS — F0390 Unspecified dementia without behavioral disturbance: Secondary | ICD-10-CM | POA: Insufficient documentation

## 2020-10-08 DIAGNOSIS — R609 Edema, unspecified: Secondary | ICD-10-CM | POA: Diagnosis not present

## 2020-10-08 DIAGNOSIS — R030 Elevated blood-pressure reading, without diagnosis of hypertension: Secondary | ICD-10-CM | POA: Diagnosis present

## 2020-10-08 DIAGNOSIS — I480 Paroxysmal atrial fibrillation: Secondary | ICD-10-CM | POA: Insufficient documentation

## 2020-10-08 LAB — URINALYSIS, ROUTINE W REFLEX MICROSCOPIC
Bacteria, UA: NONE SEEN
Bilirubin Urine: NEGATIVE
Glucose, UA: NEGATIVE mg/dL
Hgb urine dipstick: NEGATIVE
Ketones, ur: NEGATIVE mg/dL
Nitrite: NEGATIVE
Protein, ur: 100 mg/dL — AB
Specific Gravity, Urine: 1.011 (ref 1.005–1.030)
pH: 6 (ref 5.0–8.0)

## 2020-10-08 LAB — CBC WITH DIFFERENTIAL/PLATELET
Abs Immature Granulocytes: 0.04 10*3/uL (ref 0.00–0.07)
Basophils Absolute: 0.1 10*3/uL (ref 0.0–0.1)
Basophils Relative: 1 %
Eosinophils Absolute: 0.1 10*3/uL (ref 0.0–0.5)
Eosinophils Relative: 1 %
HCT: 32.1 % — ABNORMAL LOW (ref 36.0–46.0)
Hemoglobin: 10.8 g/dL — ABNORMAL LOW (ref 12.0–15.0)
Immature Granulocytes: 1 %
Lymphocytes Relative: 13 %
Lymphs Abs: 0.9 10*3/uL (ref 0.7–4.0)
MCH: 32.8 pg (ref 26.0–34.0)
MCHC: 33.6 g/dL (ref 30.0–36.0)
MCV: 97.6 fL (ref 80.0–100.0)
Monocytes Absolute: 0.7 10*3/uL (ref 0.1–1.0)
Monocytes Relative: 10 %
Neutro Abs: 5.2 10*3/uL (ref 1.7–7.7)
Neutrophils Relative %: 74 %
Platelets: 157 10*3/uL (ref 150–400)
RBC: 3.29 MIL/uL — ABNORMAL LOW (ref 3.87–5.11)
RDW: 12.8 % (ref 11.5–15.5)
WBC: 7 10*3/uL (ref 4.0–10.5)
nRBC: 0 % (ref 0.0–0.2)

## 2020-10-08 LAB — BASIC METABOLIC PANEL
Anion gap: 10 (ref 5–15)
BUN: 28 mg/dL — ABNORMAL HIGH (ref 8–23)
CO2: 22 mmol/L (ref 22–32)
Calcium: 9.3 mg/dL (ref 8.9–10.3)
Chloride: 100 mmol/L (ref 98–111)
Creatinine, Ser: 1.26 mg/dL — ABNORMAL HIGH (ref 0.44–1.00)
GFR, Estimated: 41 mL/min — ABNORMAL LOW (ref >60)
Glucose, Bld: 91 mg/dL (ref 70–99)
Potassium: 4.2 mmol/L (ref 3.5–5.1)
Sodium: 132 mmol/L — ABNORMAL LOW (ref 135–145)

## 2020-10-08 LAB — TROPONIN I (HIGH SENSITIVITY): Troponin I (High Sensitivity): 20 ng/L — ABNORMAL HIGH (ref <18)

## 2020-10-08 LAB — LACTIC ACID, PLASMA: Lactic Acid, Venous: 0.7 mmol/L (ref 0.5–1.9)

## 2020-10-08 MED ORDER — SODIUM CHLORIDE 0.9 % IV BOLUS
1000.0000 mL | Freq: Once | INTRAVENOUS | Status: AC
  Administered 2020-10-08: 1000 mL via INTRAVENOUS

## 2020-10-08 NOTE — ED Notes (Addendum)
EDP aware of pt VS, will continue to monitor.

## 2020-10-08 NOTE — Telephone Encounter (Signed)
    Patients daughter calling to report patient has BP of 193/115  Call transferred to [facility]

## 2020-10-08 NOTE — ED Notes (Signed)
Patient transported to CT 

## 2020-10-08 NOTE — Discharge Instructions (Addendum)
The 2 most common problems with afib are if it goes too fast they can make you feel unwell, or you can make you have a stroke.  Typically you get treated with medicines to keep your heart slow, you are actually on a medicine that does that already.  I talked with you about starting a blood thinning medicine and you would like to start to get over with your family doctor prior to starting.  Please call them on Monday and let them know that you were seen in the ED and see when they want to see you to talk about this.  Please return for any symptoms, chest pain shortness of breath headache neck pain one-sided numbness or weakness difficulty with speech or swallowing.

## 2020-10-08 NOTE — ED Triage Notes (Addendum)
Pt to ED via EMS from [facility] c/o new onset a fib and hypertension. Apparently pt felt "off" this morning , facility checked bp noted it was elevated, which prompted them to call EMS, Pt has hx of HTN complaint with medication -atenolol . EKG obtained by EMS which showed AFIB rate 60-80bmp. (no history). #20lac, no medications given by EMS. Last VS: 212/90. 98%RA, p 60. Pt denies SHOB, Dizziness, HA. Orientation at baseline- hx dementia.

## 2020-10-08 NOTE — ED Provider Notes (Addendum)
[facility] EMERGENCY DEPARTMENT Provider Note   CSN: [id number] Arrival date & time: 10/08/20  1646     History Chief Complaint  Patient presents with  . Hypertension  . Atrial Fibrillation    Charlene Garza is a 86 y.o. female.  85 yo F with a chief complaints of feeling off earlier today.  The patient does not remember that.  Currently denies any complaints.  Denies headache neck pain chest pain shortness of breath abdominal pain nausea vomiting or diarrhea.  She denies one-sided numbness or weakness difficulty with speech or swallowing.  I discussed with her that she was sent by her nursing facility for high blood pressure and atrial fibrillation.  The patient tells me that her blood pressure is high all the time and that is nothing new for her.  Says she tends to get herself worked up.  She has some lower extremity edema that is better than it typically is.  She does not think she is ever had atrial fibrillation before.  The history is provided by the patient.  Hypertension This is a new problem. The current episode started yesterday. The problem occurs constantly. The problem has not changed since onset.Pertinent negatives include no chest pain, no abdominal pain, no headaches and no shortness of breath. Nothing aggravates the symptoms. Nothing relieves the symptoms. She has tried nothing for the symptoms. The treatment provided no relief.  Atrial Fibrillation Pertinent negatives include no chest pain, no abdominal pain, no headaches and no shortness of breath.       Past Medical History:  Diagnosis Date  . Anemia   . Arthritis   . Hyperlipidemia   . Hypertension   . Osteopenia    T score : - 2.4 @ R femoral neck  . Renal insufficiency     Patient Active Problem List   Diagnosis Date Noted  . Hip fracture (HCC) 01/27/2020  . Dementia (HCC) 01/27/2020  . CKD (chronic kidney disease) stage 3, GFR 30-59 ml/min (HCC) 01/27/2020  . Hearing loss due to  cerumen impaction, bilateral 09/05/2019  . Routine general medical examination at a health care facility 01/25/2018  . Osteoarthritis 01/25/2018  . Venous insufficiency 07/17/2017  . Mild renal insufficiency 03/07/2012  . Vitamin D deficiency 04/30/2008  . Hyperlipidemia 04/30/2008  . Essential hypertension 05/09/2007    Past Surgical History:  Procedure Laterality Date  . CATARACT EXTRACTION     bilaterally  . CHOLECYSTECTOMY  10/17/2012   Procedure: LAPAROSCOPIC CHOLECYSTECTOMY WITH INTRAOPERATIVE CHOLANGIOGRAM;  Surgeon: Axel Filler, MD;  Location: [facility] OR;  Service: General;  Laterality: N/A;  . FEMUR IM NAIL Right 01/27/2020   Procedure: INTRAMEDULLARY (IM) NAIL FEMORAL;  Surgeon: Durene Romans, MD;  Location: [facility] ORS;  Service: Orthopedics;  Laterality: Right;  . KNEE SURGERY  10/2003   L TKR     OB History    Gravida  3   Para  1   Term      Preterm      AB      Living        SAB      TAB      Ectopic      Multiple      Live Births              Family History  Problem Relation Age of Onset  . Hypertension Father   . Hypertension Mother   . Heart attack Mother 22  . Hypertension Brother   . Diabetes  Neg Hx   . Stroke Neg Hx     Social History   Tobacco Use  . Smoking status: Never Smoker  . Smokeless tobacco: Never Used  Substance Use Topics  . Alcohol use: Yes    Alcohol/week: 7.0 standard drinks    Types: 7 Shots of liquor per week  . Drug use: No    Home Medications Prior to Admission medications   Medication Sig Start Date End Date Taking? Authorizing Provider  atenolol (TENORMIN) 25 MG tablet Take 1 tablet (25 mg total) by mouth 2 (two) times daily. Need office visit before next refill 09/09/19   Myrlene Broker, MD  ferrous sulfate 325 (65 FE) MG tablet Take 1 tablet (325 mg total) by mouth 3 (three) times daily after meals for 28 days. 01/29/20 02/26/20  Tommie Ard, PA-C  memantine (NAMENDA) 10 MG tablet Take 1 tablet  (10 mg total) by mouth daily. 02/27/20   Myrlene Broker, MD    Allergies    Ambien cr [zolpidem tartrate er] and Lovastatin  Review of Systems   Review of Systems  Constitutional: Negative for chills and fever.  HENT: Negative for congestion and rhinorrhea.   Eyes: Negative for redness and visual disturbance.  Respiratory: Negative for shortness of breath and wheezing.   Cardiovascular: Negative for chest pain and palpitations.  Gastrointestinal: Negative for abdominal pain, nausea and vomiting.  Genitourinary: Negative for dysuria and urgency.  Musculoskeletal: Negative for arthralgias and myalgias.  Skin: Negative for pallor and wound.  Neurological: Negative for dizziness and headaches.    Physical Exam Updated Vital Signs BP (!) 192/103 (BP Location: Right Arm)   Pulse 66   Temp 98.8 F (37.1 C) (Oral)   Resp 18   Ht 5\' 3"  (1.6 m)   Wt 52.2 kg   SpO2 98%   BMI 20.37 kg/m   Physical Exam Vitals and nursing note reviewed.  Constitutional:      General: She is not in acute distress.    Appearance: She is well-developed. She is not diaphoretic.  HENT:     Head: Normocephalic and atraumatic.  Eyes:     Pupils: Pupils are equal, round, and reactive to light.  Cardiovascular:     Rate and Rhythm: Normal rate and regular rhythm.     Heart sounds: No murmur heard.  No friction rub. No gallop.   Pulmonary:     Effort: Pulmonary effort is normal.     Breath sounds: No wheezing or rales.  Abdominal:     General: There is no distension.     Palpations: Abdomen is soft.     Tenderness: There is no abdominal tenderness.  Musculoskeletal:        General: No tenderness.     Cervical back: Normal range of motion and neck supple.     Right lower leg: Edema present.     Left lower leg: Edema present.     Comments: 2+ edema to the shins and below.  Skin:    General: Skin is warm and dry.  Neurological:     Mental Status: She is alert and oriented to person, place,  and time.     Cranial Nerves: Cranial nerves are intact.     Motor: Motor function is intact.     Comments: Some mild difficulty with heel-to-shin with the right lower extremity on the left.  This is the side in which the patient had a hip replacement and she feels that she just  has difficulty moving in a straight line without leg since her hip procedure.  Otherwise the patient has a benign neurologic exam.  Psychiatric:        Behavior: Behavior normal.     ED Results / Procedures / Treatments   Labs (all labs ordered are listed, but only abnormal results are displayed) Labs Reviewed - No data to display  EKG EKG Interpretation  Date/Time:  Friday October 08 2020 16:52:32 EDT Ventricular Rate:  73 PR Interval:    QRS Duration: 93 QT Interval:  448 QTC Calculation: 494 R Axis:   17 Text Interpretation: Unknown rhythm, irregular rate Low voltage, precordial leads Borderline repolarization abnormality Borderline prolonged QT interval No significant change since last tracing Confirmed by Melene PlanFloyd, Tylyn Derwin [phone]([id number]) on 10/08/2020 4:57:27 PM   Radiology No results found.  Procedures Procedures (including critical care time)  Medications Ordered in ED Medications - No data to display  ED Course  I have reviewed the triage vital signs and the nursing notes.  Pertinent labs & imaging results that were available during my care of the patient were reviewed by me and considered in my medical decision making (see chart for details).    MDM Rules/Calculators/A&P                          86 yo F here with a chief complaints of feeling off earlier today and noted to have hypertension and new onset atrial fibrillation.  Looking at the patient's EKG it looks somewhat similar to when she was admitted for her hip fracture.  She does appear to be in A. fib clinically.  She is on atenolol at home and it seems to be controlling her rate.  I discussed with her about starting anticoagulation which the  patient is deferring until she has a discussion with her family doctor.  She understands that there is risk of stroke with atrial fibrillation.  Since she was feeling off earlier I offered to perform a laboratory evaluation including chest x-ray and urinalysis.  Patient denies any urinary symptoms denies cough, is declining laboratory testing at this time.  I offered to perform at any time when she changed her mind.  She is electing to go back to her facility and plans to call her family doctor on Monday.  Discussed with the family, concerned about the hypertension, usually checked every month in the facility and normally in the 130's.  Today was in the 240's for a short period of time with tachypnea. Now resolved.    Will obtain a laboratory test here to try and eval. patient's lactate is normal UA negative for infection CBC with trivial anemia at 10.8.  No leukocytosis.  Renal function is mildly worse than baseline.  Troponin is very mildly elevated which I suspect is just due to her hypertension.  Chest x-ray viewed by me without focal infiltrate or pneumothorax.  I discussed head imaging with the patient and family which they are declining currently.  We had a long discussion again about starting anticoagulation.  Patient is electing to follow-up with her family doctor and have continued conversation with them.  She agrees to return for any worsening or any new symptoms.  5:24 PM:  I have discussed the diagnosis/risks/treatment options with the patient and believe the pt to be eligible for discharge home to follow-up with PCP. We also discussed returning to the ED immediately if new or worsening sx occur. We discussed the sx  which are most concerning (e.g., sudden worsening pain, fever, inability to tolerate by mouth) that necessitate immediate return. Medications administered to the patient during their visit and any new prescriptions provided to the patient are listed below.  Medications given during  this visit Medications - No data to display   The patient appears reasonably screen and/or stabilized for discharge and I doubt any other medical condition or other [facility] requiring further screening, evaluation, or treatment in the ED at this time prior to discharge.   Final Clinical Impression(s) / ED Diagnoses Final diagnoses:  Paroxysmal atrial fibrillation (HCC)  Asymptomatic hypertension    Rx / DC Orders ED Discharge Orders    None         Melene Plan, DO 10/08/20 2041

## 2020-10-08 NOTE — ED Notes (Signed)
Visitor at bedside.

## 2020-10-08 NOTE — ED Notes (Signed)
Pt provided with discharge instructions. Educated on said instructions. Verbalized understanding, a&ox4 on departure.

## 2020-10-11 NOTE — Telephone Encounter (Signed)
[facility] Report/Call : ---Caller states her mom's BP is 193/115, 53 just a little while ago. [facility]. Supposed to come back and check it again. She may have not of taken her meds until dtr said to over the phone. Pt states feels fine right now, moving around.  Advised see PCP within 4 hours.  Patient went to ED.

## 2020-10-13 ENCOUNTER — Telehealth: Payer: Self-pay | Admitting: Internal Medicine

## 2020-10-13 ENCOUNTER — Encounter: Payer: Self-pay | Admitting: Internal Medicine

## 2020-10-13 ENCOUNTER — Other Ambulatory Visit: Payer: Self-pay

## 2020-10-13 ENCOUNTER — Ambulatory Visit (INDEPENDENT_AMBULATORY_CARE_PROVIDER_SITE_OTHER): Payer: Medicare Other | Admitting: Internal Medicine

## 2020-10-13 VITALS — BP 114/72 | HR 69 | Temp 98.2°F | Resp 16 | Ht 63.0 in | Wt 111.0 lb

## 2020-10-13 DIAGNOSIS — I4819 Other persistent atrial fibrillation: Secondary | ICD-10-CM | POA: Diagnosis not present

## 2020-10-13 DIAGNOSIS — N1831 Chronic kidney disease, stage 3a: Secondary | ICD-10-CM | POA: Diagnosis not present

## 2020-10-13 MED ORDER — APIXABAN 2.5 MG PO TABS: 2.5000 mg | ORAL_TABLET | Freq: Two times a day (BID) | ORAL | 0 refills | Status: DC

## 2020-10-13 NOTE — Progress Notes (Signed)
Subjective:  Patient ID: Charlene Garza, female    DOB: 11-17-1930  Age: 86 y.o. MRN: [id number]  CC: Hypertension and Atrial Fibrillation  This visit occurred during the SARS-CoV-2 public health emergency.  Safety protocols were in place, including screening questions prior to the visit, additional usage of staff PPE, and extensive cleaning of exam room while observing appropriate contact time as indicated for disinfecting solutions.    HPI Charlene Garza presents for f/up - She was recently seen in the ED for palpitations and elevated BP. She was found to have new onset A fib. She has chronic unchanged LE edema.  Outpatient Medications Prior to Visit  Medication Sig Dispense Refill  . atenolol (TENORMIN) 25 MG tablet Take 1 tablet (25 mg total) by mouth 2 (two) times daily. Need office visit before next refill 180 tablet 3  . memantine (NAMENDA) 10 MG tablet Take 1 tablet (10 mg total) by mouth daily. 90 tablet 3  . ferrous sulfate 325 (65 FE) MG tablet Take 1 tablet (325 mg total) by mouth 3 (three) times daily after meals for 28 days. 84 tablet 0   No facility-administered medications prior to visit.    ROS Review of Systems  Constitutional: Negative for diaphoresis and fatigue.  HENT: Negative.  Negative for trouble swallowing.   Eyes: Negative for visual disturbance.  Respiratory: Negative for cough, chest tightness, shortness of breath and wheezing.   Cardiovascular: Positive for palpitations and leg swelling.  Gastrointestinal: Negative for abdominal pain, diarrhea, nausea and vomiting.  Endocrine: Negative.  Negative for cold intolerance and heat intolerance.  Genitourinary: Negative.  Negative for difficulty urinating.  Musculoskeletal: Negative.   Skin: Negative.   Neurological: Negative.  Negative for dizziness, weakness and light-headedness.  Hematological: Negative for adenopathy. Does not bruise/bleed easily.  Psychiatric/Behavioral: Negative.     Objective:  BP  114/72   Pulse 69   Temp 98.2 F (36.8 C) (Oral)   Resp 16   Ht 5\' 3"  (1.6 m)   Wt 111 lb (50.3 kg)   SpO2 96%   BMI 19.66 kg/m   BP Readings from Last 3 Encounters:  10/13/20 114/72  10/08/20 (!) 204/124  06/15/20 116/76    Wt Readings from Last 3 Encounters:  10/13/20 111 lb (50.3 kg)  10/08/20 115 lb (52.2 kg)  04/22/20 109 lb (49.4 kg)    Physical Exam Vitals reviewed.  Constitutional:      Appearance: She is not ill-appearing.  HENT:     Nose: Nose normal.     Mouth/Throat:     Mouth: Mucous membranes are moist.  Eyes:     General: No scleral icterus.    Conjunctiva/sclera: Conjunctivae normal.  Cardiovascular:     Rate and Rhythm: Normal rate. Rhythm irregularly irregular.     Heart sounds: No murmur heard.   Pulmonary:     Effort: Pulmonary effort is normal.     Breath sounds: No stridor. No wheezing, rhonchi or rales.  Abdominal:     General: Abdomen is flat.     Palpations: There is no mass.     Tenderness: There is no abdominal tenderness. There is no guarding.  Musculoskeletal:        General: Normal range of motion.     Cervical back: Neck supple.     Right lower leg: 1+ Pitting Edema present.     Left lower leg: 1+ Pitting Edema present.  Lymphadenopathy:     Cervical: No cervical adenopathy.  Skin:  General: Skin is warm and dry.  Neurological:     General: No focal deficit present.     Mental Status: She is alert.  Psychiatric:        Mood and Affect: Mood normal.        Behavior: Behavior normal.     Lab Results  Component Value Date   WBC 7.0 10/08/2020   HGB 10.8 (L) 10/08/2020   HCT 32.1 (L) 10/08/2020   PLT 157 10/08/2020   GLUCOSE 91 10/08/2020   CHOL 234 (H) 09/05/2019   TRIG 79.0 09/05/2019   HDL 69.00 09/05/2019   LDLDIRECT 122.9 09/05/2013   LDLCALC 149 (H) 09/05/2019   ALT 13 06/15/2020   AST 23 06/15/2020   NA 132 (L) 10/08/2020   K 4.2 10/08/2020   CL 100 10/08/2020   CREATININE 1.26 (H) 10/08/2020   BUN 28  (H) 10/08/2020   CO2 22 10/08/2020   TSH 0.61 11/22/2017   INR 1.0 01/29/2020   HGBA1C 5.5 09/05/2019    DG Chest Port 1 View  Result Date: 10/08/2020 CLINICAL DATA:  Shortness of breath EXAM: PORTABLE CHEST 1 VIEW COMPARISON:  01/26/2020 FINDINGS: Cardiomegaly. No confluent airspace opacities, effusions or edema. No acute bony abnormality. IMPRESSION: Cardiomegaly.  No active disease. Electronically Signed   By: Charlett Nose M.D.   On: 10/08/2020 18:32    Assessment & Plan:   Charlene Garza was seen today for hypertension and atrial fibrillation.  Diagnoses and all orders for this visit:  Persistent atrial fibrillation (HCC)- Her  CHA2DS2-VASc score is at least 4 (4.8% annual stroke risk). I discussed treatment options with her and her daughter today and the patient does not want to have a cardiac work-up. She is willing to take DOAC to reduce her risk of CVA. I recommended low-dose apixaban based on her age and renal insufficiency. Will monitor her TSH to screen for thyroid disease. She has good rate control with the beta-blocker. I have asked her to stop taking aspirin. -     apixaban (ELIQUIS) 2.5 MG TABS tablet; Take 1 tablet (2.5 mg total) by mouth 2 (two) times daily. -     TSH; Future  Stage 3a chronic kidney disease (HCC)- She will avoid nephrotoxic agents.   I am having Charlene Garza start on apixaban. I am also having her maintain her atenolol, ferrous sulfate, and memantine.  Meds ordered this encounter  Medications  . apixaban (ELIQUIS) 2.5 MG TABS tablet    Sig: Take 1 tablet (2.5 mg total) by mouth 2 (two) times daily.    Dispense:  180 tablet    Refill:  0     Follow-up: Return in about 3 months (around 01/13/2021).  Sanda Linger, MD

## 2020-10-13 NOTE — Telephone Encounter (Signed)
apixaban (ELIQUIS) 2.5 MG TABS tablet Pharmacy is calling wondering if we are aware the patient is on aspirin 325mg  and if she should be taking both # [phone] Ext. [phone]

## 2020-10-13 NOTE — Patient Instructions (Signed)

## 2020-10-14 ENCOUNTER — Telehealth: Payer: Self-pay | Admitting: Internal Medicine

## 2020-10-14 NOTE — Telephone Encounter (Signed)
Pharmacist was told that the patient will d/c the aspirin.

## 2020-10-14 NOTE — Telephone Encounter (Signed)
Patients daughter calling stating that the patients assisted living home [facility] needs an order for the eliquis so they can administer the medication. Fax # [phone] Doren Custard- # [phone] Also requesting you put in the order how often she needs her blood pressure to be checked.

## 2020-10-18 ENCOUNTER — Other Ambulatory Visit: Payer: Self-pay | Admitting: Internal Medicine

## 2020-10-18 DIAGNOSIS — I4819 Other persistent atrial fibrillation: Secondary | ICD-10-CM

## 2020-10-18 MED ORDER — APIXABAN 2.5 MG PO TABS: 2.5000 mg | ORAL_TABLET | Freq: Two times a day (BID) | ORAL | 0 refills | Status: AC

## 2020-10-18 NOTE — Telephone Encounter (Signed)
Rx has been faxed to number provided below

## 2020-11-26 ENCOUNTER — Ambulatory Visit (INDEPENDENT_AMBULATORY_CARE_PROVIDER_SITE_OTHER): Payer: Medicare Other | Admitting: Internal Medicine

## 2020-11-26 ENCOUNTER — Other Ambulatory Visit: Payer: Self-pay

## 2020-11-26 ENCOUNTER — Encounter: Payer: Self-pay | Admitting: Internal Medicine

## 2020-11-26 VITALS — BP 220/90 | HR 61 | Temp 97.9°F | Ht 63.0 in

## 2020-11-26 DIAGNOSIS — I10 Essential (primary) hypertension: Secondary | ICD-10-CM

## 2020-11-26 DIAGNOSIS — I872 Venous insufficiency (chronic) (peripheral): Secondary | ICD-10-CM | POA: Diagnosis not present

## 2020-11-26 LAB — POCT URINALYSIS DIPSTICK
Bilirubin, UA: NEGATIVE
Glucose, UA: NEGATIVE
Ketones, UA: NEGATIVE
Leukocytes, UA: NEGATIVE
Nitrite, UA: NEGATIVE
Protein, UA: POSITIVE — AB
Spec Grav, UA: 1.025 (ref 1.010–1.025)
Urobilinogen, UA: 0.2 E.U./dL
pH, UA: 6 (ref 5.0–8.0)

## 2020-11-26 NOTE — Assessment & Plan Note (Signed)
Swelling stable, no signs of infection. Cannot tolerate compression stockings.

## 2020-11-26 NOTE — Assessment & Plan Note (Addendum)
Will ask facility to take BP twice a week for a few weeks to monitor. Continue atenolol 25 mg BID. U/A done to rule out infection.

## 2020-11-26 NOTE — Progress Notes (Signed)
   Subjective:   Patient ID: Charlene Garza, female    DOB: 1930/06/10, 86 y.o.   MRN: [id number]  HPI The patient is an 86 YO female coming in for concerns about leg swelling. This is present for some time. Does not ever go down. Denies pain in the legs. Some dementia and daughter present to help with history. Does eat reasonably high sodium diet. New A fib since our last visit and on eliquis. No bleeding. No new headaches or chest pains or bleeding.  Review of Systems  Unable to perform ROS: Dementia  Constitutional: Negative.   HENT: Negative.   Eyes: Negative.   Respiratory: Negative for cough, chest tightness and shortness of breath.   Cardiovascular: Negative for chest pain, palpitations and leg swelling.  Gastrointestinal: Negative for abdominal distention, abdominal pain, constipation, diarrhea, nausea and vomiting.  Musculoskeletal: Negative.   Skin: Negative.   Neurological: Negative.   Psychiatric/Behavioral: Negative.     Objective:  Physical Exam Constitutional:      Appearance: She is well-developed and well-nourished.  HENT:     Head: Normocephalic and atraumatic.  Eyes:     Extraocular Movements: EOM normal.  Cardiovascular:     Rate and Rhythm: Normal rate and regular rhythm.  Pulmonary:     Effort: Pulmonary effort is normal. No respiratory distress.     Breath sounds: Normal breath sounds. No wheezing or rales.  Abdominal:     General: Bowel sounds are normal. There is no distension.     Palpations: Abdomen is soft.     Tenderness: There is no abdominal tenderness. There is no rebound.  Musculoskeletal:     Cervical back: Normal range of motion.     Right lower leg: Edema present.     Left lower leg: Edema present.  Skin:    General: Skin is warm and dry.  Neurological:     Mental Status: She is alert and oriented to person, place, and time.     Coordination: Coordination normal.  Psychiatric:        Mood and Affect: Mood and affect normal.      Vitals:   11/26/20 1538  BP: (!) 220/90  Pulse: 61  Temp: 97.9 F (36.6 C)  TempSrc: Oral  SpO2: 98%  Height: 5\' 3"  (1.6 m)   This visit occurred during the SARS-CoV-2 public health emergency.  Safety protocols were in place, including screening questions prior to the visit, additional usage of staff PPE, and extensive cleaning of exam room while observing appropriate contact time as indicated for disinfecting solutions.   Assessment & Plan:

## 2020-11-29 ENCOUNTER — Telehealth: Payer: Self-pay | Admitting: Internal Medicine

## 2020-11-29 NOTE — Telephone Encounter (Signed)
I spoke with pt's daughter, she specifically wants to know about the positive protein and blood. She wants to make sure she wasn't missing anything she should be following up with.

## 2020-11-29 NOTE — Telephone Encounter (Signed)
We had discussed this with them at visit. Are there certain questions I can answer for them?

## 2020-11-29 NOTE — Telephone Encounter (Signed)
Daughter calling to discuss urine test results  Please call

## 2020-11-30 NOTE — Telephone Encounter (Signed)
Yes, we are aware no follow up for that needed.

## 2020-12-02 NOTE — Telephone Encounter (Signed)
Pt's daughter informed of below.  

## 2020-12-07 ENCOUNTER — Telehealth: Payer: Self-pay | Admitting: Internal Medicine

## 2020-12-07 MED ORDER — VALSARTAN 40 MG PO TABS: 40.0000 mg | ORAL_TABLET | Freq: Every day | ORAL | 1 refills | Status: DC

## 2020-12-07 NOTE — Telephone Encounter (Signed)
Charlene Garza states that she was wondering if the atenolol needed to be readjusted based on the discussion from visit on 12/10 & the previous phone call this morning.

## 2020-12-07 NOTE — Telephone Encounter (Signed)
Carollee Herter notified of Dr Frutoso Chase instructions; states she is picking up medication now. Requests order for Valsartan to be sent to [facility] since pt is at [facility].  Called to verify rx should be sent to [phone].

## 2020-12-07 NOTE — Telephone Encounter (Signed)
Patients daughter Carollee Herter calling stating that the patient is in a nursing home and last night she was feeling dizzy and they took her blood pressure and it was 238/58 and per the nursing homes protocol they had to call EMS because of how high it was and when EMS got there her blood pressure was 200/111. Patient refused to go to emergency room last night because last time she went for this issue they told her they could not do anything to help because of risk of stroke. Patients daughter has a few other BP readings from the past few days:  13th- 192/111 18th- 184/106 20th- 130/86 21st am- 200/111 21st pm- 238/138 pulse 58  Tried to transfer to team health but refused, wanted to get a message to Dr. Okey Dupre. Patients daughter believes the patient needs something other than the atenolol because of the high BP readings.  Carollee [phone]

## 2020-12-07 NOTE — Telephone Encounter (Signed)
Rx valsartan 40 mg daily to add to regimen. Can have follow up visit 1 month. Continue twice weekly monitoring of BP.

## 2021-01-12 ENCOUNTER — Other Ambulatory Visit: Payer: Self-pay | Admitting: Internal Medicine

## 2021-01-12 DIAGNOSIS — I4819 Other persistent atrial fibrillation: Secondary | ICD-10-CM

## 2021-03-04 ENCOUNTER — Telehealth: Payer: Self-pay | Admitting: Internal Medicine

## 2021-03-04 NOTE — Telephone Encounter (Signed)
Patients daughter calling to report she think scam company is calling regarding DME (wheelchair) Please do not fax anything regarding wheelchair without talking to daughter first

## 2021-05-06 ENCOUNTER — Telehealth: Payer: Self-pay | Admitting: Internal Medicine

## 2021-05-06 NOTE — Progress Notes (Signed)
  Chronic Care Management   Outreach Note  05/06/2021 Name: Charlene Garza MRN: [id number] DOB: 07/06/1930  Referred by: Myrlene Broker, MD Reason for referral : No chief complaint on file.   An unsuccessful telephone outreach was attempted today. The patient was referred to the pharmacist for assistance with care management and care coordination.   Follow Up Plan:   Prathima Theone [place] Upstream Scheduler

## 2021-05-17 ENCOUNTER — Telehealth: Payer: Self-pay | Admitting: Internal Medicine

## 2021-05-17 NOTE — Progress Notes (Signed)
  Chronic Care Management   Outreach Note  05/17/2021 Name: Charlene Garza MRN: [id number] DOB: November 12, 1930  Referred by: Myrlene Broker, MD Reason for referral : No chief complaint on file.   A second unsuccessful telephone outreach was attempted today. The patient was referred to pharmacist for assistance with care management and care coordination.  Follow Up Plan:   Carmell Austria Upstream Scheduler

## 2021-05-23 ENCOUNTER — Telehealth: Payer: Self-pay | Admitting: Internal Medicine

## 2021-05-23 NOTE — Progress Notes (Signed)
  Chronic Care Management   Outreach Note  05/23/2021 Name: Charlene Garza MRN: [id number] DOB: Apr 04, 1930  Referred by: Myrlene Broker, MD Reason for referral : No chief complaint on file.   A second unsuccessful telephone outreach was attempted today. The patient was referred to pharmacist for assistance with care management and care coordination.  Follow Up Plan:   Carmell Austria Upstream Scheduler

## 2021-07-08 IMAGING — DX DG CHEST 1V PORT
1 series · 1 of 1 positions shown · non-contrast
Comparison: 01/26/2020

CLINICAL DATA: Shortness of breath

EXAM:
PORTABLE CHEST 1 VIEW

[chest ap]
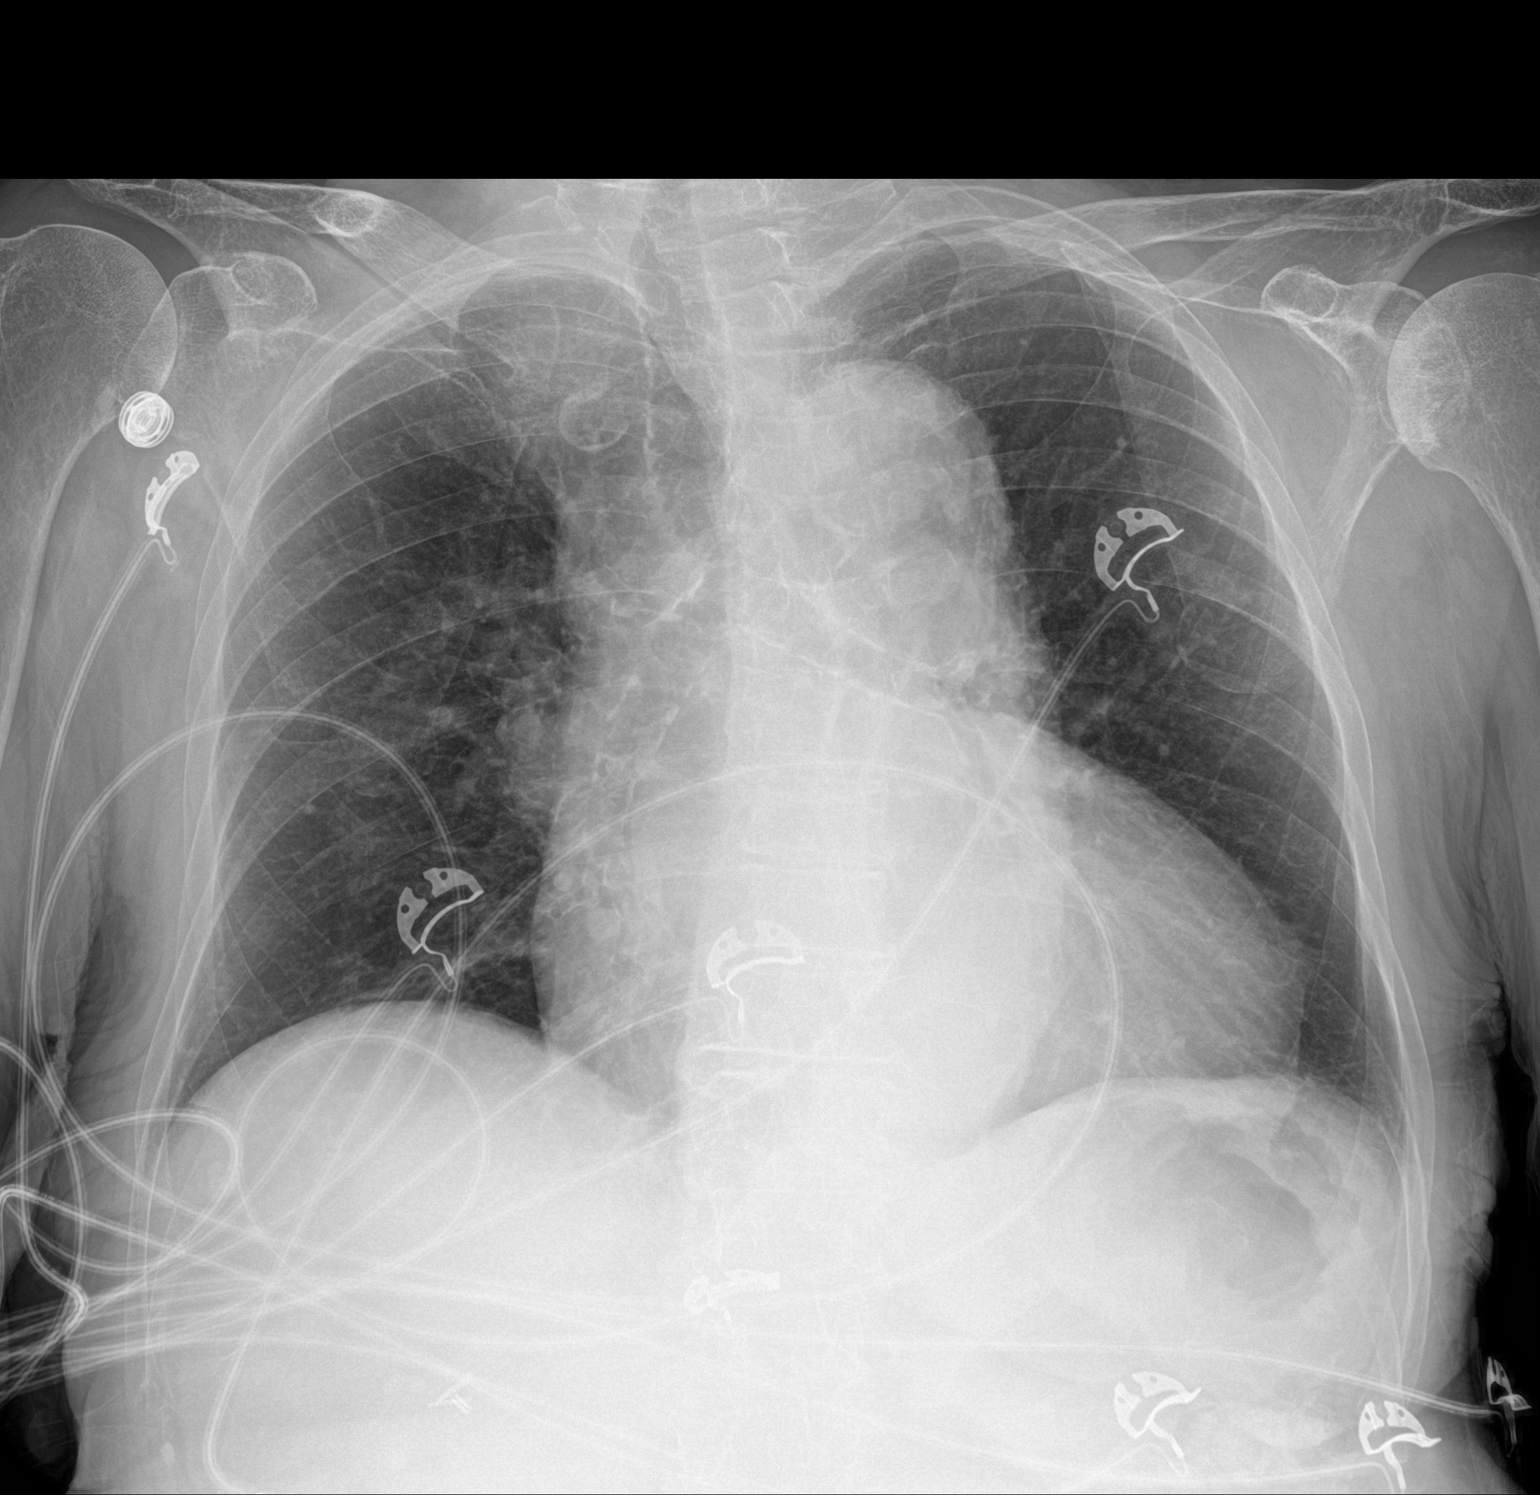

[1 of 1 positions shown; findings below may reference images not displayed]

FINDINGS: Cardiomegaly. No confluent airspace opacities, effusions or edema.
No acute bony abnormality.
IMPRESSION: Cardiomegaly.  No active disease.

## 2021-08-22 ENCOUNTER — Telehealth: Payer: Self-pay | Admitting: Internal Medicine

## 2021-08-22 NOTE — Telephone Encounter (Signed)
LVM for pt to rtn my call to schedule AWV with [facility]. Please schedule AWV if pt calls the office  

## 2022-09-01 ENCOUNTER — Emergency Department (HOSPITAL_COMMUNITY)
Admission: EM | Admit: 2022-09-01 | Discharge: 2022-09-01 | Disposition: A | Discharge status: Home / Self Care | Payer: Medicare Other | Attending: Emergency Medicine | Admitting: Emergency Medicine

## 2022-09-01 ENCOUNTER — Emergency Department (HOSPITAL_COMMUNITY): Payer: Medicare Other

## 2022-09-01 ENCOUNTER — Other Ambulatory Visit: Payer: Self-pay

## 2022-09-01 DIAGNOSIS — I10 Essential (primary) hypertension: Secondary | ICD-10-CM | POA: Diagnosis not present

## 2022-09-01 DIAGNOSIS — D72819 Decreased white blood cell count, unspecified: Secondary | ICD-10-CM | POA: Diagnosis not present

## 2022-09-01 DIAGNOSIS — Z79899 Other long term (current) drug therapy: Secondary | ICD-10-CM | POA: Diagnosis not present

## 2022-09-01 DIAGNOSIS — R4182 Altered mental status, unspecified: Secondary | ICD-10-CM | POA: Insufficient documentation

## 2022-09-01 DIAGNOSIS — R001 Bradycardia, unspecified: Secondary | ICD-10-CM | POA: Diagnosis present

## 2022-09-01 DIAGNOSIS — I4891 Unspecified atrial fibrillation: Secondary | ICD-10-CM | POA: Insufficient documentation

## 2022-09-01 DIAGNOSIS — F0390 Unspecified dementia without behavioral disturbance: Secondary | ICD-10-CM | POA: Diagnosis not present

## 2022-09-01 DIAGNOSIS — N390 Urinary tract infection, site not specified: Secondary | ICD-10-CM | POA: Insufficient documentation

## 2022-09-01 DIAGNOSIS — Z7901 Long term (current) use of anticoagulants: Secondary | ICD-10-CM | POA: Diagnosis not present

## 2022-09-01 LAB — BASIC METABOLIC PANEL
Anion gap: 8 (ref 5–15)
BUN: 25 mg/dL — ABNORMAL HIGH (ref 8–23)
CO2: 24 mmol/L (ref 22–32)
Calcium: 9.1 mg/dL (ref 8.9–10.3)
Chloride: 106 mmol/L (ref 98–111)
Creatinine, Ser: 1.44 mg/dL — ABNORMAL HIGH (ref 0.44–1.00)
GFR, Estimated: 35 mL/min — ABNORMAL LOW (ref >60)
Glucose, Bld: 91 mg/dL (ref 70–99)
Potassium: 4.2 mmol/L (ref 3.5–5.1)
Sodium: 138 mmol/L (ref 135–145)

## 2022-09-01 LAB — CBC WITH DIFFERENTIAL/PLATELET
Abs Immature Granulocytes: 0.04 10*3/uL (ref 0.00–0.07)
Basophils Absolute: 0 10*3/uL (ref 0.0–0.1)
Basophils Relative: 1 %
Eosinophils Absolute: 0.1 10*3/uL (ref 0.0–0.5)
Eosinophils Relative: 1 %
HCT: 29.3 % — ABNORMAL LOW (ref 36.0–46.0)
Hemoglobin: 10 g/dL — ABNORMAL LOW (ref 12.0–15.0)
Immature Granulocytes: 1 %
Lymphocytes Relative: 21 %
Lymphs Abs: 0.7 10*3/uL (ref 0.7–4.0)
MCH: 32.1 pg (ref 26.0–34.0)
MCHC: 34.1 g/dL (ref 30.0–36.0)
MCV: 93.9 fL (ref 80.0–100.0)
Monocytes Absolute: 0.4 10*3/uL (ref 0.1–1.0)
Monocytes Relative: 12 %
Neutro Abs: 2.3 10*3/uL (ref 1.7–7.7)
Neutrophils Relative %: 64 %
Platelets: 168 10*3/uL (ref 150–400)
RBC: 3.12 MIL/uL — ABNORMAL LOW (ref 3.87–5.11)
RDW: 13.5 % (ref 11.5–15.5)
WBC: 3.5 10*3/uL — ABNORMAL LOW (ref 4.0–10.5)
nRBC: 0 % (ref 0.0–0.2)

## 2022-09-01 LAB — URINALYSIS, ROUTINE W REFLEX MICROSCOPIC
Bilirubin Urine: NEGATIVE
Glucose, UA: NEGATIVE mg/dL
Hgb urine dipstick: NEGATIVE
Ketones, ur: NEGATIVE mg/dL
Nitrite: POSITIVE — AB
Protein, ur: NEGATIVE mg/dL
Specific Gravity, Urine: 1.009 (ref 1.005–1.030)
pH: 6 (ref 5.0–8.0)

## 2022-09-01 LAB — I-STAT CHEM 8, ED
BUN: 23 mg/dL (ref 8–23)
Calcium, Ion: 1.23 mmol/L (ref 1.15–1.40)
Chloride: 105 mmol/L (ref 98–111)
Creatinine, Ser: 1.4 mg/dL — ABNORMAL HIGH (ref 0.44–1.00)
Glucose, Bld: 88 mg/dL (ref 70–99)
HCT: 28 % — ABNORMAL LOW (ref 36.0–46.0)
Hemoglobin: 9.5 g/dL — ABNORMAL LOW (ref 12.0–15.0)
Potassium: 4.2 mmol/L (ref 3.5–5.1)
Sodium: 138 mmol/L (ref 135–145)
TCO2: 24 mmol/L (ref 22–32)

## 2022-09-01 LAB — MAGNESIUM: Magnesium: 1.9 mg/dL (ref 1.7–2.4)

## 2022-09-01 MED ORDER — CEPHALEXIN 500 MG PO CAPS: 500.0000 mg | ORAL_CAPSULE | Freq: Two times a day (BID) | ORAL | 0 refills | Status: AC

## 2022-09-01 MED ORDER — CEPHALEXIN 250 MG PO CAPS
500.0000 mg | ORAL_CAPSULE | Freq: Once | ORAL | Status: AC
  Administered 2022-09-01: 500 mg via ORAL
  Filled 2022-09-01: qty 2

## 2022-09-01 NOTE — ED Triage Notes (Signed)
Pt arrived via [facility] from [place]) with c/c of Altered Mental Status. Per EMS pt is baseline of dementia but more confused today. Pt has A-Fib but is on beta blocker and is bradycardic. Pt arrived with foul smell urine.   50HR, 170/96, 97% RA, CBG 124

## 2022-09-01 NOTE — ED Notes (Signed)
Patient verbalizes understanding of discharge instructions. Opportunity for questioning and answers were provided. Pt discharged from ED w family stable & ambulatory

## 2022-09-01 NOTE — Discharge Instructions (Addendum)
Patient's heart rate in the 40s-60s today, no acute electrolyte derangements, will stop atenolol until patient is seen by her regular doctor or cardiologist.  Suspect beta-blocker may be dropping heart rate.  Patient also found to have a mild UTI, no signs of sepsis, kidney function at baseline.  Will treat with Keflex 500 mg twice daily.  Mental status here appears to be at baseline and the rest of her work-up is reassuring.

## 2022-09-03 LAB — URINE CULTURE: Culture: >=100000 — AB

## 2022-09-04 ENCOUNTER — Telehealth (HOSPITAL_BASED_OUTPATIENT_CLINIC_OR_DEPARTMENT_OTHER): Payer: Self-pay | Admitting: *Deleted

## 2022-09-04 NOTE — Telephone Encounter (Signed)
Post ED Visit - Positive Culture Follow-up  Culture report reviewed by antimicrobial stewardship pharmacist: [facility] []  Elenor Quinones, Pharm.D. []  Heide Guile, Pharm.D., BCPS AQ-ID []  Parks Neptune, Pharm.D., BCPS []  Alycia Rossetti, Pharm.D., BCPS []  [place], Pharm.D., BCPS, AAHIVP []  Legrand Como, Pharm.D., BCPS, AAHIVP []  Salome Arnt, PharmD, BCPS []  Johnnette Gourd, PharmD, BCPS []  Hughes Better, PharmD, BCPS []  Leeroy Cha, PharmD []  Laqueta Linden, PharmD, BCPS []  Albertina Parr, PharmD  [facility] Team []  Leodis Sias, PharmD []  Lindell Spar, PharmD []  Royetta Asal, PharmD []  Graylin Shiver, Rph []  Rema Fendt) Glennon Mac, PharmD []  Arlyn Dunning, PharmD []  Netta Cedars, PharmD []  Dia Sitter, PharmD []  Leone Haven, PharmD []  Gretta Arab, PharmD []  Theodis Shove, PharmD []  Peggyann Juba, PharmD []  Reuel Boom, PharmD   Positive urine culture Treated with Cephalexin, organism sensitive to the same and no further patient follow-up is required at this time.   Luisa Hart, PharmD  Harlon Flor Talley 09/04/2022, 9:47 AM

## 2022-09-15 ENCOUNTER — Encounter: Payer: Self-pay | Admitting: *Deleted

## 2022-10-12 NOTE — ED Provider Notes (Signed)
[facility] EMERGENCY DEPARTMENT Provider Note   CSN: [id number] Arrival date & time: 09/01/22  1126     History  Chief Complaint  Patient presents with   Altered Mental Status    Charlene Garza is a 88 y.o. female.  Charlene Garza is a 88 y.o. female with a hx of HTN, HLD, Dementia, arthritis, who presents from [place] [facility] memory care facility via EMS for evaluation of altered mental status.  Per EMS patient had dementia at baseline but facility reported increased confusion today.  EMS also noted patient to be bradycardic with heart rate in the 40s-60s, has history of A-fib, on beta-blocker.  Patient reports she has had some occasional urinary frequency, but denies any pain with urination.  EMS noted foul-smelling urine.  No known fevers at facility.  No cough, congestion or rhinorrhea, patient denies any chest pain or shortness of breath.  No lightheadedness or syncope.  Additional history provided by daughter at bedside who does report that she seems slightly more confused than usual if anything but is alert and able to provide history. No falls or head injury.  The history is provided by the patient, a relative and medical records.  Altered Mental Status Associated symptoms: no abdominal pain, no fever, no light-headedness, no nausea and no vomiting        Home Medications Prior to Admission medications   Medication Sig Start Date End Date Taking? Authorizing Provider  acetaminophen (TYLENOL) 500 MG tablet Take 500 mg by mouth daily.   Yes [provider]  apixaban (ELIQUIS) 2.5 MG TABS tablet Take 1 tablet (2.5 mg total) by mouth 2 (two) times daily. 10/18/20  Yes Janith Lima, MD  gabapentin (NEURONTIN) 100 MG capsule Take 100 mg by mouth at bedtime. 08/20/22  Yes [provider]  hydrALAZINE (APRESOLINE) 100 MG tablet Take 100 mg by mouth 2 (two) times daily. 08/20/22  Yes [provider]  HYDROcodone-acetaminophen  (NORCO/VICODIN) 5-325 MG tablet Take 1 tablet by mouth every 6 (six) hours as needed (pain). Do not exceed 4 tablets in 24 hours.   Yes [provider]  lactose free nutrition (BOOST) LIQD Take 1 Container by mouth daily at 12 noon. Vanilla   Yes [provider]  memantine (NAMENDA) 5 MG tablet Take 5 mg by mouth at bedtime. 08/20/22  Yes [provider]  mirtazapine (REMERON) 7.5 MG tablet Take 7.5 mg by mouth at bedtime. 08/20/22  Yes [provider]  QUEtiapine (SEROQUEL) 25 MG tablet Take 12.5 mg by mouth at bedtime.   Yes [provider]  valsartan (DIOVAN) 80 MG tablet Take 80 mg by mouth daily.   Yes [provider]  ferrous sulfate 325 (65 FE) MG tablet Take 1 tablet (325 mg total) by mouth 3 (three) times daily after meals for 28 days. Patient not taking: Reported on 09/01/2022 01/29/20 10/28/22  Irving Copas, PA-C  memantine (NAMENDA) 10 MG tablet Take 1 tablet (10 mg total) by mouth daily. Patient not taking: Reported on 09/01/2022 02/27/20   Hoyt Koch, MD  valsartan (DIOVAN) 40 MG tablet Take 1 tablet (40 mg total) by mouth daily. Patient not taking: Reported on 09/01/2022 12/07/20   Hoyt Koch, MD      Allergies    Ambien cr [zolpidem tartrate er] and Mevacor [lovastatin]    Review of Systems   Review of Systems  Constitutional:  Positive for fatigue. Negative for chills and fever.  HENT: Negative.  Respiratory:  Negative for cough and shortness of breath.   Cardiovascular:  Negative for chest pain.  Gastrointestinal:  Negative for abdominal pain, nausea and vomiting.  Genitourinary:  Positive for dysuria and frequency.  Neurological:  Negative for syncope and light-headedness.  All other systems reviewed and are negative.   Physical Exam Updated Vital Signs BP (!) 173/79   Pulse (!) 45   Temp 97.8 F (36.6 C) (Oral)   Resp 16   Ht 5\' 4"  (1.626 m)   SpO2 97%   BMI 19.05 kg/m  Physical  Exam Vitals and nursing note reviewed.  Constitutional:      General: She is not in acute distress.    Appearance: Normal appearance. She is well-developed. She is not ill-appearing or diaphoretic.  HENT:     Head: Normocephalic and atraumatic.     Mouth/Throat:     Mouth: Mucous membranes are moist.     Pharynx: Oropharynx is clear.  Eyes:     General:        Right eye: No discharge.        Left eye: No discharge.     Extraocular Movements: Extraocular movements intact.     Pupils: Pupils are equal, round, and reactive to light.  Cardiovascular:     Rate and Rhythm: Regular rhythm. Bradycardia present.     Pulses: Normal pulses.     Heart sounds: Normal heart sounds.     Comments: Bradycardia with regular rhythm  Pulmonary:     Effort: Pulmonary effort is normal. No respiratory distress.     Breath sounds: Normal breath sounds. No wheezing or rales.     Comments: Respirations equal and unlabored, patient able to speak in full sentences, lungs clear to auscultation bilaterally  Abdominal:     General: Bowel sounds are normal. There is no distension.     Palpations: Abdomen is soft. There is no mass.     Tenderness: There is no abdominal tenderness. There is no guarding.     Comments: Abdomen soft, nondistended, nontender to palpation in all quadrants without guarding or peritoneal signs  Musculoskeletal:        General: No deformity.     Cervical back: Neck supple.  Skin:    General: Skin is warm and dry.     Capillary Refill: Capillary refill takes less than 2 seconds.  Neurological:     Mental Status: She is alert and oriented to person, place, and time.     Coordination: Coordination normal.     Comments: Speech is clear, able to follow commands CN III-XII intact Normal strength in upper and lower extremities bilaterally including dorsiflexion and plantar flexion, strong and equal grip strength Sensation normal to light and sharp touch Moves extremities without ataxia,  coordination intact  Psychiatric:        Mood and Affect: Mood normal.        Behavior: Behavior normal.     ED Results / Procedures / Treatments   Labs (all labs ordered are listed, but only abnormal results are displayed) Labs Reviewed  URINE CULTURE - Abnormal; Notable for the following components:      Result Value   Culture >=100,000 COLONIES/mL ESCHERICHIA COLI (*)    Organism ID, Bacteria ESCHERICHIA COLI (*)    All other components within normal limits  BASIC METABOLIC PANEL - Abnormal; Notable for the following components:   BUN 25 (*)    Creatinine, Ser 1.44 (*)    GFR, Estimated 35 (*)  All other components within normal limits  CBC WITH DIFFERENTIAL/PLATELET - Abnormal; Notable for the following components:   WBC 3.5 (*)    RBC 3.12 (*)    Hemoglobin 10.0 (*)    HCT 29.3 (*)    All other components within normal limits  URINALYSIS, ROUTINE W REFLEX MICROSCOPIC - Abnormal; Notable for the following components:   APPearance HAZY (*)    Nitrite POSITIVE (*)    Leukocytes,Ua TRACE (*)    Bacteria, UA FEW (*)    All other components within normal limits  I-STAT CHEM 8, ED - Abnormal; Notable for the following components:   Creatinine, Ser 1.40 (*)    Hemoglobin 9.5 (*)    HCT 28.0 (*)    All other components within normal limits  MAGNESIUM    EKG EKG Interpretation  Date/Time:  Friday September 01 2022 11:35:39 EDT Ventricular Rate:  46 PR Interval:  52 QRS Duration: 99 QT Interval:  503 QTC Calculation: 440 R Axis:   -44 Text Interpretation: Bradycardia with irregular rate Short PR interval Left axis deviation Low voltage, precordial leads Consider anterior infarct Confirmed by Malvin Johns [phone]) on 09/01/2022 12:36:37 PM  Radiology DG Chest Port 1 View  Result Date: 09/01/2022 CLINICAL DATA:  Altered mental status EXAM: PORTABLE CHEST 1 VIEW COMPARISON:  10/08/2020 FINDINGS: Cardiac enlargement without heart failure. Lungs remain clear without  infiltrate or effusion. Mild scarring left lung base. IMPRESSION: No active disease. Electronically Signed   By: Franchot Gallo M.D.   On: 09/01/2022 12:34     Procedures Procedures    Medications Ordered in ED Medications  cephALEXin (KEFLEX) capsule 500 mg (500 mg Oral Given 09/01/22 1416)    ED Course/ Medical Decision Making/ A&P                           Medical Decision Making Amount and/or Complexity of Data Reviewed Labs: ordered. Radiology: ordered.  Risk Prescription drug management.   88 y.o. female presents to the ED with complaints of confusion, although n arrival pt is able to answer questions and daughter reports overall she is at baseline. Concern from facility and family for UTI and pt noted to be bradycardic. This involves an extensive number of treatment options, and is a complaint that carries with it a high risk of complications and morbidity.  The differential diagnosis includes electrolyte derangement, iatrogenic bradycardia 2/2/ beta blocker, UTI, pneumonia  On arrival pt is nontoxic, vitals sig for sinus brady, mild HTN otherwise WNL.  Additional history obtained from daughter at beside. Previous records obtained and reviewed   Lab Tests:  I Ordered, reviewed, and interpreted labs, which included: mild leukopenia, Hgb stable, Cr at baseline, no electrolyte derangements, UA concerning for UTI, culture sent  Imaging Studies ordered:  I ordered imaging studies which included CXR, I independently visualized and interpreted imaging which showed no PNA, edema or cardiomegaly  ED Course:   PT at baseline mental status, noted to be bradycardic, but in sinus, no electrolyte derangements, no CP or syncope. Will DC atenolol. UA shouws Uti, will treat with keflex. Pt ambulatory in dept and remains at baseline, stable for DC back to memory care facility with family.  At this time there does not appear to be any evidence of an acute emergency medical condition  requiring further emergent evaluation and the patient appears stable for discharge with appropriate outpatient follow up. Diagnosis and return precautions discussed with patient who  verbalizes understanding and is agreeable to discharge.      Portions of this note were generated with Lobbyist. Dictation errors may occur despite best attempts at proofreading.         Final Clinical Impression(s) / ED Diagnoses Final diagnoses:  Bradycardia  Acute UTI    Rx / DC Orders ED Discharge Orders          Ordered    cephALEXin (KEFLEX) 500 MG capsule  2 times daily        09/01/22 1455    Ambulatory referral to Cardiology       Comments: If you have not heard from the Cardiology office within the next 72 hours please call [phone].   09/01/22 1456              Jacqlyn Larsen, PA-C 10/13/22 1130    Malvin Johns, MD 10/18/22 [phone]

## 2022-11-01 ENCOUNTER — Encounter (HOSPITAL_BASED_OUTPATIENT_CLINIC_OR_DEPARTMENT_OTHER): Payer: Self-pay | Admitting: Cardiology

## 2022-11-01 ENCOUNTER — Ambulatory Visit (HOSPITAL_BASED_OUTPATIENT_CLINIC_OR_DEPARTMENT_OTHER): Payer: Medicare Other | Admitting: Cardiology

## 2022-11-01 VITALS — BP 127/85 | HR 66 | Ht 64.0 in | Wt 114.0 lb

## 2022-11-01 DIAGNOSIS — R0989 Other specified symptoms and signs involving the circulatory and respiratory systems: Secondary | ICD-10-CM | POA: Diagnosis not present

## 2022-11-01 DIAGNOSIS — T50905A Adverse effect of unspecified drugs, medicaments and biological substances, initial encounter: Secondary | ICD-10-CM | POA: Diagnosis not present

## 2022-11-01 DIAGNOSIS — I872 Venous insufficiency (chronic) (peripheral): Secondary | ICD-10-CM

## 2022-11-01 DIAGNOSIS — I480 Paroxysmal atrial fibrillation: Secondary | ICD-10-CM

## 2022-11-01 DIAGNOSIS — Z7189 Other specified counseling: Secondary | ICD-10-CM | POA: Diagnosis not present

## 2022-11-01 DIAGNOSIS — R001 Bradycardia, unspecified: Secondary | ICD-10-CM | POA: Diagnosis not present

## 2022-11-01 NOTE — Progress Notes (Signed)
Cardiology Office Note:    Date:  11/01/2022   ID:  Charlene Garza, DOB 04-23-1930, MRN [id number]  PCP:  Ashley Royalty, NP  Cardiologist:  Jodelle Red, MD  Referring MD: Legrand Rams   CC: new patient evaluation for bradycardia  History of Present Illness:    Charlene Garza is a 88 y.o. female with a hx of atrial fibrillation, hypertension, hyperlipidemia, dementia, anemia, arthritis, osteopenia, and renal insufficiency, who is seen as a new consult at the request of Dartha Lodge, PA-C for the evaluation and management of bradycardia.  She presented to the ED 09/01/2022 from [facility] via EMS for evaluation of altered mental status. The facility reported she had increased confusion. EMS also noted patient to be bradycardic with heart rate in the 40s-60s. Her EKG showed bradycardia at 46 bpm, Short PR interval, Left axis deviation, Low voltage, precordial leads, Consider anterior infarct. No electrolyte derangements, no CP or syncope. Her atenolol was discontinued. Urinalysis was consistent with UTI, so she was treated with keflex. She was discharged in stable condition.   Today, the patient is accompanied by her daughter who also assists with providing history. She is concerned with her blood pressure which was initially 200/109 in clinic, but later improved 135/92. However, she attributes her high BP to white coat hypertension. She presents a BP log which shows more controlled readings at home such as 127/85. Her antihypertensive regimen includes valsartan and hydralazine. She complains of side effects of lightheadedness from hydralazine.   Generally she believes her higher blood pressures are also related to her anxiety. When she has difficulty searching for something, she *panics. She states that she becomes very anxious at times which makes it difficult for her to catch her breath. Sometimes she also experiences associated chest  discomfort. Her symptoms usually resolve with rest or trying to calm down. Taking xanax in the evenings has improved her anxiety as well as her sleep quality.   Since her atenolol was discontinued, she has been feeling better with higher heart rates on average. Her HR measured at 66 bpm during today's visit.  She took Amlodipine years ago, but it was stopped possibly due to swelling in her LE bilaterally. In the last year and a half, her neuropathy has caused her more issues.  She denies any palpitations, headaches, syncope, orthopnea, or PND.    Past Medical History:  Diagnosis Date   Anemia    Arthritis    Hyperlipidemia    Hypertension    Osteopenia    T score : - 2.4 @ R femoral neck   Renal insufficiency     Past Surgical History:  Procedure Laterality Date   CATARACT EXTRACTION     bilaterally   CHOLECYSTECTOMY  10/17/2012   Procedure: LAPAROSCOPIC CHOLECYSTECTOMY WITH INTRAOPERATIVE CHOLANGIOGRAM;  Surgeon: Axel Filler, MD;  Location: [facility] OR;  Service: General;  Laterality: N/A;   FEMUR IM NAIL Right 01/27/2020   Procedure: INTRAMEDULLARY (IM) NAIL FEMORAL;  Surgeon: Durene Romans, MD;  Location: [facility] ORS;  Service: Orthopedics;  Laterality: Right;   KNEE SURGERY  10/2003   L TKR    Current Medications: Current Outpatient Medications on File Prior to Visit  Medication Sig   acetaminophen (TYLENOL) 500 MG tablet Take 500 mg by mouth daily.   ALPRAZolam (XANAX) 0.25 MG tablet Take 0.25 mg by mouth daily as needed.   apixaban (ELIQUIS) 2.5 MG TABS tablet Take 1 tablet (2.5 mg total) by mouth 2 (  two) times daily.   gabapentin (NEURONTIN) 100 MG capsule Take 100 mg by mouth at bedtime.   hydrALAZINE (APRESOLINE) 100 MG tablet Take 100 mg by mouth 2 (two) times daily.   HYDROcodone-acetaminophen (NORCO/VICODIN) 5-325 MG tablet Take 1 tablet by mouth every 6 (six) hours as needed (pain). Do not exceed 4 tablets in 24 hours.   lactose free nutrition (BOOST) LIQD Take 1  Container by mouth daily at 12 noon. Vanilla   memantine (NAMENDA) 10 MG tablet Take 1 tablet (10 mg total) by mouth daily.   memantine (NAMENDA) 5 MG tablet Take 5 mg by mouth at bedtime.   mirtazapine (REMERON) 7.5 MG tablet Take 7.5 mg by mouth at bedtime.   Multiple Vitamins-Minerals (CENTRUM SILVER 50+WOMEN PO) Take 1 tablet by mouth daily.   QUEtiapine (SEROQUEL) 25 MG tablet Take 12.5 mg by mouth at bedtime.   valsartan (DIOVAN) 80 MG tablet Take 80 mg by mouth daily.   No current facility-administered medications on file prior to visit.     Allergies:   Ambien cr [zolpidem tartrate er] and Mevacor [lovastatin]   Social History   Tobacco Use   Smoking status: Never   Smokeless tobacco: Never  Substance Use Topics   Alcohol use: Yes    Alcohol/week: 7.0 standard drinks of alcohol    Types: 7 Shots of liquor per week   Drug use: No    Family History: family history includes Heart attack (age of onset: 6787) in her mother; Hypertension in her brother, father, and mother. There is no history of Diabetes or Stroke.  ROS:   Please see the history of present illness.  Additional pertinent ROS: Constitutional: Negative for chills, fever, night sweats, unintentional weight loss.   HENT: Negative for ear pain and hearing loss.   Eyes: Negative for loss of vision and eye pain.  Respiratory: Negative for cough, sputum, wheezing. Positive for shortness of breath.   Cardiovascular: See HPI. Gastrointestinal: Negative for abdominal pain, melena, and hematochezia.  Genitourinary: Negative for dysuria and hematuria.  Musculoskeletal: Negative for falls and myalgias.  Skin: Negative for itching and rash.  Neurological: Negative for focal weakness, focal sensory changes and loss of consciousness. Positive for lightheadedness, anxiety. Endo/Heme/Allergies: Does not bruise/bleed easily.     EKGs/Labs/Other Studies Reviewed:    The following studies were reviewed today:  Echo   01/29/2020: Sonographer Comments: Suboptimal apical window. off axis apical windows  IMPRESSIONS    1. Left ventricular ejection fraction, by estimation, is 60 to 65%. The  left ventricle has normal function. The left ventrical has no regional  wall motion abnormalities. Left ventricular diastolic parameters are  indeterminate.   2. Right ventricular systolic function is normal. The right ventricular  size is normal.   3. Left atrial size was mildly dilated.   4. Right atrial size was mildly dilated.   5. The mitral valve is normal in structure and function. Mild mitral  valve regurgitation.   6. The aortic valve is tricuspid. Aortic valve regurgitation is trivial.   EKG:  EKG is personally reviewed.   11/01/2022:  sinus rhythm at 66 bpm with PAC  Recent Labs: 09/01/2022: BUN 23; Creatinine, Ser 1.40; Hemoglobin 9.5; Magnesium 1.9; Platelets 168; Potassium 4.2; Sodium 138   Recent Lipid Panel    Component Value Date/Time   CHOL 234 (H) 09/05/2019 1107   CHOL 274 (H) 07/02/2015 1444   TRIG 79.0 09/05/2019 1107   TRIG 60 07/02/2015 1444   HDL 69.00 09/05/2019  1107   HDL 87 07/02/2015 1444   CHOLHDL 3 09/05/2019 1107   VLDL 15.8 09/05/2019 1107   LDLCALC 149 (H) 09/05/2019 1107   LDLCALC 175 (H) 07/02/2015 1444   LDLDIRECT 122.9 09/05/2013 1027    Physical Exam:    VS:  BP 127/85   Pulse 66   Ht 5\' 4"  (1.626 m)   Wt 114 lb (51.7 kg)   BMI 19.57 kg/m     Wt Readings from Last 3 Encounters:  11/01/22 114 lb (51.7 kg)  10/13/20 111 lb (50.3 kg)  10/08/20 115 lb (52.2 kg)    GEN: Well nourished, well developed in no acute distress. In wheelchair. HEENT: Normal, moist mucous membranes NECK: No JVD CARDIAC: regular rhythm with occasional early beats, normal S1 and S2, no rubs or gallops. No murmur. VASCULAR: Radial and DP pulses 2+ bilaterally. No carotid bruits RESPIRATORY:  Clear to auscultation without rales, wheezing or rhonchi  ABDOMEN: Soft, non-tender,  non-distended MUSCULOSKELETAL:  Ambulates independently SKIN: Warm and dry, chronic severe bilateral non-pitting LE edema NEUROLOGIC:  Alert and oriented x 3. No focal neuro deficits noted. PSYCHIATRIC:  Normal affect    ASSESSMENT:    1. Drug-induced bradycardia   2. Labile hypertension   3. Chronic venous insufficiency   4. Cardiac risk counseling   5. Paroxysmal atrial fibrillation (HCC)    PLAN:    Drug induced bradycardia: -secondary to atenolol, resolved since stopping -avoid av nodal agents if possible  Paroxysmal atrial fibrillation -CHA2DS2/VAS Stroke Risk Points 4  -in sinus today -on reduced dose apixaban due to age, weight -avoid AV nodal agents as above. If she develops rapid afib, need to be cautious of tachy-brady syndrome when managing  Labile hypertension -given range, especially with normal readings at home, will not change therapy today -tolerating hydralazine, valsartan  Chronic venous insufficiency -unclear, but suspect this may have been why amlodipine was stopped in the past  CV counseling: discussed management strategies for symptoms at length today to try to help her de-escalate  Plan for follow up: As needed.  10/10/20, MD, PhD, [facility] Echo  [facility]    Medication Adjustments/Labs and Tests Ordered: Current medicines are reviewed at length with the patient today.  Concerns regarding medicines are outlined above.   Orders Placed This Encounter  Procedures   EKG 12-Lead   No orders of the defined types were placed in this encounter.  Patient Instructions  Medication Instructions:  Continue current medications  *If you need a refill on your cardiac medications before your next appointment, please call your pharmacy*   Lab Work: None Ordered   Testing/Procedures: None Ordered   Follow-Up: At [facility], you and your health needs are our priority.  As part of our continuing mission to provide you  with exceptional heart care, we have created designated Provider Care Teams.  These Care Teams include your primary Cardiologist (physician) and Advanced Practice Providers (APPs -  Physician Assistants and Nurse Practitioners) who all work together to provide you with the care you need, when you need it.  We recommend signing up for the patient portal called "MyChart".  Sign up information is provided on this After Visit Summary.  MyChart is used to connect with patients for Virtual Visits (Telemedicine).  Patients are able to view lab/test results, encounter notes, upcoming appointments, etc.  Non-urgent messages can be sent to your provider as well.   To learn more about what you can do with MyChart, go  to [email or web address].    Your next appointment:    As Needed         I,Mitra Faeizi,acting as a scribe for Jodelle Red, MD.,have documented all relevant documentation on the behalf of Jodelle Red, MD,as directed by  Jodelle Red, MD while in the presence of Jodelle Red, MD.  I, Jodelle Red, MD, have reviewed all documentation for this visit. The documentation on 11/01/22 for the exam, diagnosis, procedures, and orders are all accurate and complete.   Signed, Jodelle Red, MD PhD 11/01/2022 8:28 PM    [facility]

## 2022-11-01 NOTE — Patient Instructions (Signed)
Medication Instructions:  Continue current medications  *If you need a refill on your cardiac medications before your next appointment, please call your pharmacy*   Lab Work: None Ordered    Testing/Procedures: None Ordered   Follow-Up: At [facility], you and your health needs are our priority.  As part of our continuing mission to provide you with exceptional heart care, we have created designated Provider Care Teams.  These Care Teams include your primary Cardiologist (physician) and Advanced Practice Providers (APPs -  Physician Assistants and Nurse Practitioners) who all work together to provide you with the care you need, when you need it.  We recommend signing up for the patient portal called "MyChart".  Sign up information is provided on this After Visit Summary.  MyChart is used to connect with patients for Virtual Visits (Telemedicine).  Patients are able to view lab/test results, encounter notes, upcoming appointments, etc.  Non-urgent messages can be sent to your provider as well.   To learn more about what you can do with MyChart, go to [email or web address].    Your next appointment:   As Needed       

## 2022-12-20 DIAGNOSIS — R2681 Unsteadiness on feet: Secondary | ICD-10-CM | POA: Diagnosis not present

## 2022-12-20 DIAGNOSIS — M6281 Muscle weakness (generalized): Secondary | ICD-10-CM | POA: Diagnosis not present

## 2022-12-23 DIAGNOSIS — R2681 Unsteadiness on feet: Secondary | ICD-10-CM | POA: Diagnosis not present

## 2022-12-23 DIAGNOSIS — M6281 Muscle weakness (generalized): Secondary | ICD-10-CM | POA: Diagnosis not present

## 2022-12-25 DIAGNOSIS — R2681 Unsteadiness on feet: Secondary | ICD-10-CM | POA: Diagnosis not present

## 2022-12-25 DIAGNOSIS — M6281 Muscle weakness (generalized): Secondary | ICD-10-CM | POA: Diagnosis not present

## 2022-12-26 DIAGNOSIS — M150 Primary generalized (osteo)arthritis: Secondary | ICD-10-CM | POA: Diagnosis not present

## 2022-12-26 DIAGNOSIS — I4811 Longstanding persistent atrial fibrillation: Secondary | ICD-10-CM | POA: Diagnosis not present

## 2022-12-26 DIAGNOSIS — R2681 Unsteadiness on feet: Secondary | ICD-10-CM | POA: Diagnosis not present

## 2022-12-26 DIAGNOSIS — M6281 Muscle weakness (generalized): Secondary | ICD-10-CM | POA: Diagnosis not present

## 2022-12-26 DIAGNOSIS — F0394 Unspecified dementia, unspecified severity, with anxiety: Secondary | ICD-10-CM | POA: Diagnosis not present

## 2022-12-28 DIAGNOSIS — G301 Alzheimer's disease with late onset: Secondary | ICD-10-CM | POA: Diagnosis not present

## 2022-12-28 DIAGNOSIS — R41841 Cognitive communication deficit: Secondary | ICD-10-CM | POA: Diagnosis not present

## 2022-12-28 DIAGNOSIS — R630 Anorexia: Secondary | ICD-10-CM | POA: Diagnosis not present

## 2022-12-28 DIAGNOSIS — D649 Anemia, unspecified: Secondary | ICD-10-CM | POA: Diagnosis not present

## 2022-12-28 DIAGNOSIS — Z993 Dependence on wheelchair: Secondary | ICD-10-CM | POA: Diagnosis not present

## 2022-12-29 DIAGNOSIS — R41841 Cognitive communication deficit: Secondary | ICD-10-CM | POA: Diagnosis not present

## 2023-01-03 DIAGNOSIS — R41841 Cognitive communication deficit: Secondary | ICD-10-CM | POA: Diagnosis not present

## 2023-01-04 DIAGNOSIS — R2681 Unsteadiness on feet: Secondary | ICD-10-CM | POA: Diagnosis not present

## 2023-01-04 DIAGNOSIS — M6281 Muscle weakness (generalized): Secondary | ICD-10-CM | POA: Diagnosis not present

## 2023-01-05 DIAGNOSIS — R41841 Cognitive communication deficit: Secondary | ICD-10-CM | POA: Diagnosis not present

## 2023-01-09 DIAGNOSIS — I480 Paroxysmal atrial fibrillation: Secondary | ICD-10-CM | POA: Diagnosis not present

## 2023-01-09 DIAGNOSIS — R41841 Cognitive communication deficit: Secondary | ICD-10-CM | POA: Diagnosis not present

## 2023-01-09 DIAGNOSIS — M6281 Muscle weakness (generalized): Secondary | ICD-10-CM | POA: Diagnosis not present

## 2023-01-09 DIAGNOSIS — F0394 Unspecified dementia, unspecified severity, with anxiety: Secondary | ICD-10-CM | POA: Diagnosis not present

## 2023-01-09 DIAGNOSIS — F411 Generalized anxiety disorder: Secondary | ICD-10-CM | POA: Diagnosis not present

## 2023-01-09 DIAGNOSIS — R2681 Unsteadiness on feet: Secondary | ICD-10-CM | POA: Diagnosis not present

## 2023-01-10 DIAGNOSIS — M6281 Muscle weakness (generalized): Secondary | ICD-10-CM | POA: Diagnosis not present

## 2023-01-10 DIAGNOSIS — R2681 Unsteadiness on feet: Secondary | ICD-10-CM | POA: Diagnosis not present

## 2023-01-11 DIAGNOSIS — R41841 Cognitive communication deficit: Secondary | ICD-10-CM | POA: Diagnosis not present

## 2023-01-11 DIAGNOSIS — M62542 Muscle wasting and atrophy, not elsewhere classified, left hand: Secondary | ICD-10-CM | POA: Diagnosis not present

## 2023-01-11 DIAGNOSIS — M62511 Muscle wasting and atrophy, not elsewhere classified, right shoulder: Secondary | ICD-10-CM | POA: Diagnosis not present

## 2023-01-11 DIAGNOSIS — M62541 Muscle wasting and atrophy, not elsewhere classified, right hand: Secondary | ICD-10-CM | POA: Diagnosis not present

## 2023-01-12 DIAGNOSIS — R41841 Cognitive communication deficit: Secondary | ICD-10-CM | POA: Diagnosis not present

## 2023-01-12 DIAGNOSIS — M62541 Muscle wasting and atrophy, not elsewhere classified, right hand: Secondary | ICD-10-CM | POA: Diagnosis not present

## 2023-01-12 DIAGNOSIS — M62511 Muscle wasting and atrophy, not elsewhere classified, right shoulder: Secondary | ICD-10-CM | POA: Diagnosis not present

## 2023-01-12 DIAGNOSIS — M62542 Muscle wasting and atrophy, not elsewhere classified, left hand: Secondary | ICD-10-CM | POA: Diagnosis not present

## 2023-01-15 DIAGNOSIS — M62542 Muscle wasting and atrophy, not elsewhere classified, left hand: Secondary | ICD-10-CM | POA: Diagnosis not present

## 2023-01-15 DIAGNOSIS — D649 Anemia, unspecified: Secondary | ICD-10-CM | POA: Diagnosis not present

## 2023-01-15 DIAGNOSIS — R41841 Cognitive communication deficit: Secondary | ICD-10-CM | POA: Diagnosis not present

## 2023-01-15 DIAGNOSIS — M62541 Muscle wasting and atrophy, not elsewhere classified, right hand: Secondary | ICD-10-CM | POA: Diagnosis not present

## 2023-01-15 DIAGNOSIS — M62511 Muscle wasting and atrophy, not elsewhere classified, right shoulder: Secondary | ICD-10-CM | POA: Diagnosis not present

## 2023-01-15 DIAGNOSIS — G629 Polyneuropathy, unspecified: Secondary | ICD-10-CM | POA: Diagnosis not present

## 2023-01-16 DIAGNOSIS — R300 Dysuria: Secondary | ICD-10-CM | POA: Diagnosis not present

## 2023-01-17 DIAGNOSIS — M6281 Muscle weakness (generalized): Secondary | ICD-10-CM | POA: Diagnosis not present

## 2023-01-17 DIAGNOSIS — R2681 Unsteadiness on feet: Secondary | ICD-10-CM | POA: Diagnosis not present

## 2023-01-18 DIAGNOSIS — R41841 Cognitive communication deficit: Secondary | ICD-10-CM | POA: Diagnosis not present

## 2023-01-18 DIAGNOSIS — M62511 Muscle wasting and atrophy, not elsewhere classified, right shoulder: Secondary | ICD-10-CM | POA: Diagnosis not present

## 2023-01-18 DIAGNOSIS — M62541 Muscle wasting and atrophy, not elsewhere classified, right hand: Secondary | ICD-10-CM | POA: Diagnosis not present

## 2023-01-18 DIAGNOSIS — M62542 Muscle wasting and atrophy, not elsewhere classified, left hand: Secondary | ICD-10-CM | POA: Diagnosis not present

## 2023-01-20 DIAGNOSIS — M6281 Muscle weakness (generalized): Secondary | ICD-10-CM | POA: Diagnosis not present

## 2023-01-20 DIAGNOSIS — R2681 Unsteadiness on feet: Secondary | ICD-10-CM | POA: Diagnosis not present

## 2023-01-22 DIAGNOSIS — M62542 Muscle wasting and atrophy, not elsewhere classified, left hand: Secondary | ICD-10-CM | POA: Diagnosis not present

## 2023-01-22 DIAGNOSIS — M62541 Muscle wasting and atrophy, not elsewhere classified, right hand: Secondary | ICD-10-CM | POA: Diagnosis not present

## 2023-01-22 DIAGNOSIS — M62511 Muscle wasting and atrophy, not elsewhere classified, right shoulder: Secondary | ICD-10-CM | POA: Diagnosis not present

## 2023-01-22 DIAGNOSIS — R41841 Cognitive communication deficit: Secondary | ICD-10-CM | POA: Diagnosis not present

## 2023-01-23 DIAGNOSIS — I4811 Longstanding persistent atrial fibrillation: Secondary | ICD-10-CM | POA: Diagnosis not present

## 2023-01-23 DIAGNOSIS — M6281 Muscle weakness (generalized): Secondary | ICD-10-CM | POA: Diagnosis not present

## 2023-01-23 DIAGNOSIS — R2681 Unsteadiness on feet: Secondary | ICD-10-CM | POA: Diagnosis not present

## 2023-01-23 DIAGNOSIS — M150 Primary generalized (osteo)arthritis: Secondary | ICD-10-CM | POA: Diagnosis not present

## 2023-01-23 DIAGNOSIS — F0394 Unspecified dementia, unspecified severity, with anxiety: Secondary | ICD-10-CM | POA: Diagnosis not present

## 2023-01-23 DIAGNOSIS — R41841 Cognitive communication deficit: Secondary | ICD-10-CM | POA: Diagnosis not present

## 2023-01-24 DIAGNOSIS — R41841 Cognitive communication deficit: Secondary | ICD-10-CM | POA: Diagnosis not present

## 2023-01-24 DIAGNOSIS — M62542 Muscle wasting and atrophy, not elsewhere classified, left hand: Secondary | ICD-10-CM | POA: Diagnosis not present

## 2023-01-24 DIAGNOSIS — M62541 Muscle wasting and atrophy, not elsewhere classified, right hand: Secondary | ICD-10-CM | POA: Diagnosis not present

## 2023-01-24 DIAGNOSIS — M62511 Muscle wasting and atrophy, not elsewhere classified, right shoulder: Secondary | ICD-10-CM | POA: Diagnosis not present

## 2023-01-26 DIAGNOSIS — R2681 Unsteadiness on feet: Secondary | ICD-10-CM | POA: Diagnosis not present

## 2023-01-26 DIAGNOSIS — M6281 Muscle weakness (generalized): Secondary | ICD-10-CM | POA: Diagnosis not present

## 2023-01-29 DIAGNOSIS — M6281 Muscle weakness (generalized): Secondary | ICD-10-CM | POA: Diagnosis not present

## 2023-01-29 DIAGNOSIS — R2681 Unsteadiness on feet: Secondary | ICD-10-CM | POA: Diagnosis not present

## 2023-01-30 DIAGNOSIS — M62511 Muscle wasting and atrophy, not elsewhere classified, right shoulder: Secondary | ICD-10-CM | POA: Diagnosis not present

## 2023-01-30 DIAGNOSIS — M62542 Muscle wasting and atrophy, not elsewhere classified, left hand: Secondary | ICD-10-CM | POA: Diagnosis not present

## 2023-01-30 DIAGNOSIS — M62541 Muscle wasting and atrophy, not elsewhere classified, right hand: Secondary | ICD-10-CM | POA: Diagnosis not present

## 2023-01-30 DIAGNOSIS — R41841 Cognitive communication deficit: Secondary | ICD-10-CM | POA: Diagnosis not present

## 2023-01-31 DIAGNOSIS — R41841 Cognitive communication deficit: Secondary | ICD-10-CM | POA: Diagnosis not present

## 2023-02-01 DIAGNOSIS — M62541 Muscle wasting and atrophy, not elsewhere classified, right hand: Secondary | ICD-10-CM | POA: Diagnosis not present

## 2023-02-01 DIAGNOSIS — R41841 Cognitive communication deficit: Secondary | ICD-10-CM | POA: Diagnosis not present

## 2023-02-01 DIAGNOSIS — M62511 Muscle wasting and atrophy, not elsewhere classified, right shoulder: Secondary | ICD-10-CM | POA: Diagnosis not present

## 2023-02-01 DIAGNOSIS — M62542 Muscle wasting and atrophy, not elsewhere classified, left hand: Secondary | ICD-10-CM | POA: Diagnosis not present

## 2023-02-02 DIAGNOSIS — R41841 Cognitive communication deficit: Secondary | ICD-10-CM | POA: Diagnosis not present

## 2023-02-03 DIAGNOSIS — R2681 Unsteadiness on feet: Secondary | ICD-10-CM | POA: Diagnosis not present

## 2023-02-03 DIAGNOSIS — M6281 Muscle weakness (generalized): Secondary | ICD-10-CM | POA: Diagnosis not present

## 2023-02-05 DIAGNOSIS — M62511 Muscle wasting and atrophy, not elsewhere classified, right shoulder: Secondary | ICD-10-CM | POA: Diagnosis not present

## 2023-02-05 DIAGNOSIS — M62542 Muscle wasting and atrophy, not elsewhere classified, left hand: Secondary | ICD-10-CM | POA: Diagnosis not present

## 2023-02-05 DIAGNOSIS — R41841 Cognitive communication deficit: Secondary | ICD-10-CM | POA: Diagnosis not present

## 2023-02-05 DIAGNOSIS — M62541 Muscle wasting and atrophy, not elsewhere classified, right hand: Secondary | ICD-10-CM | POA: Diagnosis not present

## 2023-02-06 DIAGNOSIS — M6281 Muscle weakness (generalized): Secondary | ICD-10-CM | POA: Diagnosis not present

## 2023-02-06 DIAGNOSIS — R2681 Unsteadiness on feet: Secondary | ICD-10-CM | POA: Diagnosis not present

## 2023-02-06 DIAGNOSIS — R41841 Cognitive communication deficit: Secondary | ICD-10-CM | POA: Diagnosis not present

## 2023-02-07 DIAGNOSIS — R41841 Cognitive communication deficit: Secondary | ICD-10-CM | POA: Diagnosis not present

## 2023-02-08 DIAGNOSIS — R41841 Cognitive communication deficit: Secondary | ICD-10-CM | POA: Diagnosis not present

## 2023-02-08 DIAGNOSIS — M62511 Muscle wasting and atrophy, not elsewhere classified, right shoulder: Secondary | ICD-10-CM | POA: Diagnosis not present

## 2023-02-08 DIAGNOSIS — M62541 Muscle wasting and atrophy, not elsewhere classified, right hand: Secondary | ICD-10-CM | POA: Diagnosis not present

## 2023-02-08 DIAGNOSIS — M62542 Muscle wasting and atrophy, not elsewhere classified, left hand: Secondary | ICD-10-CM | POA: Diagnosis not present

## 2023-02-10 DIAGNOSIS — R2681 Unsteadiness on feet: Secondary | ICD-10-CM | POA: Diagnosis not present

## 2023-02-10 DIAGNOSIS — M6281 Muscle weakness (generalized): Secondary | ICD-10-CM | POA: Diagnosis not present

## 2023-02-12 DIAGNOSIS — R2681 Unsteadiness on feet: Secondary | ICD-10-CM | POA: Diagnosis not present

## 2023-02-12 DIAGNOSIS — M6281 Muscle weakness (generalized): Secondary | ICD-10-CM | POA: Diagnosis not present

## 2023-02-13 DIAGNOSIS — R2681 Unsteadiness on feet: Secondary | ICD-10-CM | POA: Diagnosis not present

## 2023-02-13 DIAGNOSIS — F411 Generalized anxiety disorder: Secondary | ICD-10-CM | POA: Diagnosis not present

## 2023-02-13 DIAGNOSIS — I129 Hypertensive chronic kidney disease with stage 1 through stage 4 chronic kidney disease, or unspecified chronic kidney disease: Secondary | ICD-10-CM | POA: Diagnosis not present

## 2023-02-13 DIAGNOSIS — I4811 Longstanding persistent atrial fibrillation: Secondary | ICD-10-CM | POA: Diagnosis not present

## 2023-02-13 DIAGNOSIS — R41841 Cognitive communication deficit: Secondary | ICD-10-CM | POA: Diagnosis not present

## 2023-02-13 DIAGNOSIS — N1832 Chronic kidney disease, stage 3b: Secondary | ICD-10-CM | POA: Diagnosis not present

## 2023-02-13 DIAGNOSIS — M6281 Muscle weakness (generalized): Secondary | ICD-10-CM | POA: Diagnosis not present

## 2023-02-14 DIAGNOSIS — R41841 Cognitive communication deficit: Secondary | ICD-10-CM | POA: Diagnosis not present

## 2023-02-14 DIAGNOSIS — M62511 Muscle wasting and atrophy, not elsewhere classified, right shoulder: Secondary | ICD-10-CM | POA: Diagnosis not present

## 2023-02-14 DIAGNOSIS — M62542 Muscle wasting and atrophy, not elsewhere classified, left hand: Secondary | ICD-10-CM | POA: Diagnosis not present

## 2023-02-14 DIAGNOSIS — M62541 Muscle wasting and atrophy, not elsewhere classified, right hand: Secondary | ICD-10-CM | POA: Diagnosis not present

## 2023-02-15 DIAGNOSIS — M62541 Muscle wasting and atrophy, not elsewhere classified, right hand: Secondary | ICD-10-CM | POA: Diagnosis not present

## 2023-02-15 DIAGNOSIS — M62542 Muscle wasting and atrophy, not elsewhere classified, left hand: Secondary | ICD-10-CM | POA: Diagnosis not present

## 2023-02-15 DIAGNOSIS — R41841 Cognitive communication deficit: Secondary | ICD-10-CM | POA: Diagnosis not present

## 2023-02-15 DIAGNOSIS — M62511 Muscle wasting and atrophy, not elsewhere classified, right shoulder: Secondary | ICD-10-CM | POA: Diagnosis not present

## 2023-02-16 DIAGNOSIS — M62541 Muscle wasting and atrophy, not elsewhere classified, right hand: Secondary | ICD-10-CM | POA: Diagnosis not present

## 2023-02-16 DIAGNOSIS — R41841 Cognitive communication deficit: Secondary | ICD-10-CM | POA: Diagnosis not present

## 2023-02-16 DIAGNOSIS — M62542 Muscle wasting and atrophy, not elsewhere classified, left hand: Secondary | ICD-10-CM | POA: Diagnosis not present

## 2023-02-16 DIAGNOSIS — M62511 Muscle wasting and atrophy, not elsewhere classified, right shoulder: Secondary | ICD-10-CM | POA: Diagnosis not present

## 2023-02-19 DIAGNOSIS — R2681 Unsteadiness on feet: Secondary | ICD-10-CM | POA: Diagnosis not present

## 2023-02-19 DIAGNOSIS — M6281 Muscle weakness (generalized): Secondary | ICD-10-CM | POA: Diagnosis not present

## 2023-02-21 DIAGNOSIS — R41841 Cognitive communication deficit: Secondary | ICD-10-CM | POA: Diagnosis not present

## 2023-02-21 DIAGNOSIS — M62541 Muscle wasting and atrophy, not elsewhere classified, right hand: Secondary | ICD-10-CM | POA: Diagnosis not present

## 2023-02-21 DIAGNOSIS — M62542 Muscle wasting and atrophy, not elsewhere classified, left hand: Secondary | ICD-10-CM | POA: Diagnosis not present

## 2023-02-21 DIAGNOSIS — M62511 Muscle wasting and atrophy, not elsewhere classified, right shoulder: Secondary | ICD-10-CM | POA: Diagnosis not present

## 2023-02-22 DIAGNOSIS — R41841 Cognitive communication deficit: Secondary | ICD-10-CM | POA: Diagnosis not present

## 2023-02-23 DIAGNOSIS — R41841 Cognitive communication deficit: Secondary | ICD-10-CM | POA: Diagnosis not present

## 2023-02-23 DIAGNOSIS — M62542 Muscle wasting and atrophy, not elsewhere classified, left hand: Secondary | ICD-10-CM | POA: Diagnosis not present

## 2023-02-23 DIAGNOSIS — M62541 Muscle wasting and atrophy, not elsewhere classified, right hand: Secondary | ICD-10-CM | POA: Diagnosis not present

## 2023-02-23 DIAGNOSIS — M62511 Muscle wasting and atrophy, not elsewhere classified, right shoulder: Secondary | ICD-10-CM | POA: Diagnosis not present

## 2023-02-24 DIAGNOSIS — M6281 Muscle weakness (generalized): Secondary | ICD-10-CM | POA: Diagnosis not present

## 2023-02-24 DIAGNOSIS — R2681 Unsteadiness on feet: Secondary | ICD-10-CM | POA: Diagnosis not present

## 2023-02-26 DIAGNOSIS — R2681 Unsteadiness on feet: Secondary | ICD-10-CM | POA: Diagnosis not present

## 2023-02-26 DIAGNOSIS — M6281 Muscle weakness (generalized): Secondary | ICD-10-CM | POA: Diagnosis not present

## 2023-02-27 DIAGNOSIS — R41841 Cognitive communication deficit: Secondary | ICD-10-CM | POA: Diagnosis not present

## 2023-02-27 DIAGNOSIS — M150 Primary generalized (osteo)arthritis: Secondary | ICD-10-CM | POA: Diagnosis not present

## 2023-02-27 DIAGNOSIS — M62542 Muscle wasting and atrophy, not elsewhere classified, left hand: Secondary | ICD-10-CM | POA: Diagnosis not present

## 2023-02-27 DIAGNOSIS — M62511 Muscle wasting and atrophy, not elsewhere classified, right shoulder: Secondary | ICD-10-CM | POA: Diagnosis not present

## 2023-02-27 DIAGNOSIS — M62541 Muscle wasting and atrophy, not elsewhere classified, right hand: Secondary | ICD-10-CM | POA: Diagnosis not present

## 2023-02-27 DIAGNOSIS — F0394 Unspecified dementia, unspecified severity, with anxiety: Secondary | ICD-10-CM | POA: Diagnosis not present

## 2023-02-27 DIAGNOSIS — I4811 Longstanding persistent atrial fibrillation: Secondary | ICD-10-CM | POA: Diagnosis not present

## 2023-02-28 DIAGNOSIS — M6281 Muscle weakness (generalized): Secondary | ICD-10-CM | POA: Diagnosis not present

## 2023-02-28 DIAGNOSIS — R2681 Unsteadiness on feet: Secondary | ICD-10-CM | POA: Diagnosis not present

## 2023-03-01 DIAGNOSIS — R41841 Cognitive communication deficit: Secondary | ICD-10-CM | POA: Diagnosis not present

## 2023-03-01 DIAGNOSIS — M62542 Muscle wasting and atrophy, not elsewhere classified, left hand: Secondary | ICD-10-CM | POA: Diagnosis not present

## 2023-03-01 DIAGNOSIS — M62511 Muscle wasting and atrophy, not elsewhere classified, right shoulder: Secondary | ICD-10-CM | POA: Diagnosis not present

## 2023-03-01 DIAGNOSIS — M62541 Muscle wasting and atrophy, not elsewhere classified, right hand: Secondary | ICD-10-CM | POA: Diagnosis not present

## 2023-03-05 DIAGNOSIS — M62511 Muscle wasting and atrophy, not elsewhere classified, right shoulder: Secondary | ICD-10-CM | POA: Diagnosis not present

## 2023-03-05 DIAGNOSIS — R41841 Cognitive communication deficit: Secondary | ICD-10-CM | POA: Diagnosis not present

## 2023-03-05 DIAGNOSIS — M62542 Muscle wasting and atrophy, not elsewhere classified, left hand: Secondary | ICD-10-CM | POA: Diagnosis not present

## 2023-03-05 DIAGNOSIS — M62541 Muscle wasting and atrophy, not elsewhere classified, right hand: Secondary | ICD-10-CM | POA: Diagnosis not present

## 2023-03-06 DIAGNOSIS — R2681 Unsteadiness on feet: Secondary | ICD-10-CM | POA: Diagnosis not present

## 2023-03-06 DIAGNOSIS — R41841 Cognitive communication deficit: Secondary | ICD-10-CM | POA: Diagnosis not present

## 2023-03-06 DIAGNOSIS — M6281 Muscle weakness (generalized): Secondary | ICD-10-CM | POA: Diagnosis not present

## 2023-03-08 DIAGNOSIS — M62542 Muscle wasting and atrophy, not elsewhere classified, left hand: Secondary | ICD-10-CM | POA: Diagnosis not present

## 2023-03-08 DIAGNOSIS — M62541 Muscle wasting and atrophy, not elsewhere classified, right hand: Secondary | ICD-10-CM | POA: Diagnosis not present

## 2023-03-08 DIAGNOSIS — M62511 Muscle wasting and atrophy, not elsewhere classified, right shoulder: Secondary | ICD-10-CM | POA: Diagnosis not present

## 2023-03-08 DIAGNOSIS — R41841 Cognitive communication deficit: Secondary | ICD-10-CM | POA: Diagnosis not present

## 2023-03-09 DIAGNOSIS — R2681 Unsteadiness on feet: Secondary | ICD-10-CM | POA: Diagnosis not present

## 2023-03-09 DIAGNOSIS — M6281 Muscle weakness (generalized): Secondary | ICD-10-CM | POA: Diagnosis not present

## 2023-03-11 DIAGNOSIS — M6281 Muscle weakness (generalized): Secondary | ICD-10-CM | POA: Diagnosis not present

## 2023-03-11 DIAGNOSIS — R2681 Unsteadiness on feet: Secondary | ICD-10-CM | POA: Diagnosis not present

## 2023-03-12 DIAGNOSIS — M62542 Muscle wasting and atrophy, not elsewhere classified, left hand: Secondary | ICD-10-CM | POA: Diagnosis not present

## 2023-03-12 DIAGNOSIS — M62511 Muscle wasting and atrophy, not elsewhere classified, right shoulder: Secondary | ICD-10-CM | POA: Diagnosis not present

## 2023-03-12 DIAGNOSIS — M62541 Muscle wasting and atrophy, not elsewhere classified, right hand: Secondary | ICD-10-CM | POA: Diagnosis not present

## 2023-03-12 DIAGNOSIS — R41841 Cognitive communication deficit: Secondary | ICD-10-CM | POA: Diagnosis not present

## 2023-03-13 DIAGNOSIS — N390 Urinary tract infection, site not specified: Secondary | ICD-10-CM | POA: Diagnosis not present

## 2023-03-13 DIAGNOSIS — R41841 Cognitive communication deficit: Secondary | ICD-10-CM | POA: Diagnosis not present

## 2023-03-13 DIAGNOSIS — N1832 Chronic kidney disease, stage 3b: Secondary | ICD-10-CM | POA: Diagnosis not present

## 2023-03-13 DIAGNOSIS — F0394 Unspecified dementia, unspecified severity, with anxiety: Secondary | ICD-10-CM | POA: Diagnosis not present

## 2023-03-14 DIAGNOSIS — R600 Localized edema: Secondary | ICD-10-CM | POA: Diagnosis not present

## 2023-03-14 DIAGNOSIS — N3000 Acute cystitis without hematuria: Secondary | ICD-10-CM | POA: Diagnosis not present

## 2023-03-14 DIAGNOSIS — D508 Other iron deficiency anemias: Secondary | ICD-10-CM | POA: Diagnosis not present

## 2023-03-14 DIAGNOSIS — G301 Alzheimer's disease with late onset: Secondary | ICD-10-CM | POA: Diagnosis not present

## 2023-03-14 DIAGNOSIS — R41841 Cognitive communication deficit: Secondary | ICD-10-CM | POA: Diagnosis not present

## 2023-03-15 DIAGNOSIS — R2681 Unsteadiness on feet: Secondary | ICD-10-CM | POA: Diagnosis not present

## 2023-03-15 DIAGNOSIS — M25511 Pain in right shoulder: Secondary | ICD-10-CM | POA: Diagnosis not present

## 2023-03-15 DIAGNOSIS — M6281 Muscle weakness (generalized): Secondary | ICD-10-CM | POA: Diagnosis not present

## 2023-03-16 DIAGNOSIS — M62541 Muscle wasting and atrophy, not elsewhere classified, right hand: Secondary | ICD-10-CM | POA: Diagnosis not present

## 2023-03-16 DIAGNOSIS — M62542 Muscle wasting and atrophy, not elsewhere classified, left hand: Secondary | ICD-10-CM | POA: Diagnosis not present

## 2023-03-16 DIAGNOSIS — R41841 Cognitive communication deficit: Secondary | ICD-10-CM | POA: Diagnosis not present

## 2023-03-16 DIAGNOSIS — M62511 Muscle wasting and atrophy, not elsewhere classified, right shoulder: Secondary | ICD-10-CM | POA: Diagnosis not present

## 2023-04-20 ENCOUNTER — Other Ambulatory Visit: Payer: Self-pay

## 2023-04-20 ENCOUNTER — Emergency Department (HOSPITAL_COMMUNITY): Payer: Medicare Other

## 2023-04-20 ENCOUNTER — Observation Stay (HOSPITAL_COMMUNITY)
Admission: EM | Admit: 2023-04-20 | Discharge: 2023-04-22 | Disposition: A | Discharge status: Skilled Nursing Facility | Payer: Medicare Other | Attending: Internal Medicine | Admitting: Internal Medicine

## 2023-04-20 ENCOUNTER — Encounter (HOSPITAL_COMMUNITY): Payer: Self-pay

## 2023-04-20 DIAGNOSIS — D61818 Other pancytopenia: Secondary | ICD-10-CM | POA: Diagnosis not present

## 2023-04-20 DIAGNOSIS — J019 Acute sinusitis, unspecified: Secondary | ICD-10-CM | POA: Diagnosis not present

## 2023-04-20 DIAGNOSIS — N1832 Chronic kidney disease, stage 3b: Secondary | ICD-10-CM | POA: Insufficient documentation

## 2023-04-20 DIAGNOSIS — E785 Hyperlipidemia, unspecified: Secondary | ICD-10-CM | POA: Diagnosis present

## 2023-04-20 DIAGNOSIS — N183 Chronic kidney disease, stage 3 unspecified: Secondary | ICD-10-CM | POA: Diagnosis present

## 2023-04-20 DIAGNOSIS — Z7901 Long term (current) use of anticoagulants: Secondary | ICD-10-CM | POA: Insufficient documentation

## 2023-04-20 DIAGNOSIS — Z79899 Other long term (current) drug therapy: Secondary | ICD-10-CM | POA: Insufficient documentation

## 2023-04-20 DIAGNOSIS — I130 Hypertensive heart and chronic kidney disease with heart failure and stage 1 through stage 4 chronic kidney disease, or unspecified chronic kidney disease: Secondary | ICD-10-CM | POA: Insufficient documentation

## 2023-04-20 DIAGNOSIS — R0789 Other chest pain: Principal | ICD-10-CM | POA: Insufficient documentation

## 2023-04-20 DIAGNOSIS — I10 Essential (primary) hypertension: Secondary | ICD-10-CM | POA: Diagnosis present

## 2023-04-20 DIAGNOSIS — I509 Heart failure, unspecified: Secondary | ICD-10-CM | POA: Insufficient documentation

## 2023-04-20 DIAGNOSIS — F0390 Unspecified dementia without behavioral disturbance: Secondary | ICD-10-CM | POA: Diagnosis not present

## 2023-04-20 DIAGNOSIS — R519 Headache, unspecified: Secondary | ICD-10-CM | POA: Diagnosis present

## 2023-04-20 DIAGNOSIS — R7401 Elevation of levels of liver transaminase levels: Secondary | ICD-10-CM | POA: Diagnosis not present

## 2023-04-20 DIAGNOSIS — R079 Chest pain, unspecified: Secondary | ICD-10-CM | POA: Diagnosis present

## 2023-04-20 DIAGNOSIS — I4819 Other persistent atrial fibrillation: Secondary | ICD-10-CM | POA: Diagnosis not present

## 2023-04-20 LAB — COMPREHENSIVE METABOLIC PANEL
ALT: 136 U/L — ABNORMAL HIGH (ref 0–44)
AST: 171 U/L — ABNORMAL HIGH (ref 15–41)
Albumin: 3.9 g/dL (ref 3.5–5.0)
Alkaline Phosphatase: 174 U/L — ABNORMAL HIGH (ref 38–126)
Anion gap: 9 (ref 5–15)
BUN: 42 mg/dL — ABNORMAL HIGH (ref 8–23)
CO2: 25 mmol/L (ref 22–32)
Calcium: 9.3 mg/dL (ref 8.9–10.3)
Chloride: 101 mmol/L (ref 98–111)
Creatinine, Ser: 1.51 mg/dL — ABNORMAL HIGH (ref 0.44–1.00)
GFR, Estimated: 33 mL/min — ABNORMAL LOW (ref >60)
Glucose, Bld: 94 mg/dL (ref 70–99)
Potassium: 4.6 mmol/L (ref 3.5–5.1)
Sodium: 135 mmol/L (ref 135–145)
Total Bilirubin: 0.6 mg/dL (ref 0.3–1.2)
Total Protein: 7.5 g/dL (ref 6.5–8.1)

## 2023-04-20 LAB — CBC WITH DIFFERENTIAL/PLATELET
Abs Immature Granulocytes: 0.02 10*3/uL (ref 0.00–0.07)
Basophils Absolute: 0.1 10*3/uL (ref 0.0–0.1)
Basophils Relative: 1 %
Eosinophils Absolute: 0.1 10*3/uL (ref 0.0–0.5)
Eosinophils Relative: 2 %
HCT: 31.7 % — ABNORMAL LOW (ref 36.0–46.0)
Hemoglobin: 10.7 g/dL — ABNORMAL LOW (ref 12.0–15.0)
Immature Granulocytes: 0 %
Lymphocytes Relative: 22 %
Lymphs Abs: 1 10*3/uL (ref 0.7–4.0)
MCH: 32.5 pg (ref 26.0–34.0)
MCHC: 33.8 g/dL (ref 30.0–36.0)
MCV: 96.4 fL (ref 80.0–100.0)
Monocytes Absolute: 0.6 10*3/uL (ref 0.1–1.0)
Monocytes Relative: 14 %
Neutro Abs: 2.7 10*3/uL (ref 1.7–7.7)
Neutrophils Relative %: 61 %
Platelets: 142 10*3/uL — ABNORMAL LOW (ref 150–400)
RBC: 3.29 MIL/uL — ABNORMAL LOW (ref 3.87–5.11)
RDW: 13 % (ref 11.5–15.5)
WBC: 4.5 10*3/uL (ref 4.0–10.5)
nRBC: 0 % (ref 0.0–0.2)

## 2023-04-20 LAB — BRAIN NATRIURETIC PEPTIDE: B Natriuretic Peptide: 340 pg/mL — ABNORMAL HIGH (ref 0.0–100.0)

## 2023-04-20 LAB — TROPONIN I (HIGH SENSITIVITY)
Troponin I (High Sensitivity): 21 ng/L — ABNORMAL HIGH (ref <18)
Troponin I (High Sensitivity): 20 ng/L — ABNORMAL HIGH (ref <18)

## 2023-04-20 MED ORDER — DEXAMETHASONE SODIUM PHOSPHATE 10 MG/ML IJ SOLN
10.0000 mg | Freq: Once | INTRAMUSCULAR | Status: AC
  Administered 2023-04-20: 10 mg via INTRAVENOUS
  Filled 2023-04-20: qty 1

## 2023-04-20 MED ORDER — DIPHENHYDRAMINE HCL 50 MG/ML IJ SOLN
12.5000 mg | Freq: Once | INTRAMUSCULAR | Status: AC
  Administered 2023-04-20: 12.5 mg via INTRAVENOUS
  Filled 2023-04-20: qty 1

## 2023-04-20 MED ORDER — HYDROMORPHONE HCL 1 MG/ML IJ SOLN
0.5000 mg | Freq: Once | INTRAMUSCULAR | Status: AC
  Administered 2023-04-20: 0.5 mg via INTRAVENOUS
  Filled 2023-04-20: qty 1

## 2023-04-20 MED ORDER — LORAZEPAM 0.5 MG PO TABS: 0.5000 mg | ORAL_TABLET | Freq: Once | ORAL | Status: DC

## 2023-04-20 MED ORDER — SODIUM CHLORIDE 0.9 % IV BOLUS
500.0000 mL | Freq: Once | INTRAVENOUS | Status: AC
  Administered 2023-04-20: 500 mL via INTRAVENOUS

## 2023-04-20 MED ORDER — PROCHLORPERAZINE EDISYLATE 10 MG/2ML IJ SOLN
10.0000 mg | Freq: Once | INTRAMUSCULAR | Status: AC
  Administered 2023-04-20: 10 mg via INTRAVENOUS
  Filled 2023-04-20: qty 2

## 2023-04-20 NOTE — ED Provider Notes (Addendum)
[place] EMERGENCY DEPARTMENT AT [facility] Provider Note   CSN: [id number] Arrival date & time: 04/20/23  1716     History Chief Complaint  Patient presents with   Chest Pain   Headache    Charlene Garza is a 89 y.o. female.  Patient presents emergency department complaints of chest pain and headache.  Patient currently not endorsing chest pain or headache but daughter reports that she has been reporting chest pain and headache for the last day or so.  Typically patient does not experience headaches.  Patient does have a known history of CHF.  Currently on Eliquis.  Currently denies shortness of breath but does report some lower extremity swelling.  Daughter was initially concerned about patient as she was somewhat lethargic but patient is reportedly now back to baseline, she is here in the emergency department.  Lives at assisted living.   Chest Pain Associated symptoms: headache   Headache      Home Medications Prior to Admission medications   Medication Sig Start Date End Date Taking? Authorizing Provider  acetaminophen (TYLENOL) 500 MG tablet Take 500 mg by mouth daily.    [provider]  ALPRAZolam Prudy Feeler) 0.25 MG tablet Take 0.25 mg by mouth daily as needed. 10/26/22   [provider]  apixaban (ELIQUIS) 2.5 MG TABS tablet Take 1 tablet (2.5 mg total) by mouth 2 (two) times daily. 10/18/20   Etta Grandchild, MD  gabapentin (NEURONTIN) 100 MG capsule Take 100 mg by mouth at bedtime. 08/20/22   [provider]  hydrALAZINE (APRESOLINE) 100 MG tablet Take 100 mg by mouth 2 (two) times daily. 08/20/22   [provider]  HYDROcodone-acetaminophen (NORCO/VICODIN) 5-325 MG tablet Take 1 tablet by mouth every 6 (six) hours as needed (pain). Do not exceed 4 tablets in 24 hours.    [provider]  lactose free nutrition (BOOST) LIQD Take 1 Container by mouth daily at 12 noon. Vanilla    [provider]  memantine (NAMENDA)  10 MG tablet Take 1 tablet (10 mg total) by mouth daily. 02/27/20   Myrlene Broker, MD  memantine (NAMENDA) 5 MG tablet Take 5 mg by mouth at bedtime. 08/20/22   [provider]  mirtazapine (REMERON) 7.5 MG tablet Take 7.5 mg by mouth at bedtime. 08/20/22   [provider]  Multiple Vitamins-Minerals (CENTRUM SILVER 50+WOMEN PO) Take 1 tablet by mouth daily.    [provider]  QUEtiapine (SEROQUEL) 25 MG tablet Take 12.5 mg by mouth at bedtime.    [provider]  valsartan (DIOVAN) 80 MG tablet Take 80 mg by mouth daily.    [provider]      Allergies    Ambien cr [zolpidem tartrate er] and Mevacor [lovastatin]    Review of Systems   Review of Systems  Cardiovascular:  Positive for chest pain.  Neurological:  Positive for headaches.  All other systems reviewed and are negative.   Physical Exam Updated Vital Signs BP (!) 195/99 (BP Location: Left Arm)   Pulse (!) 58   Temp 98.2 F (36.8 C) (Oral)   Resp 19   Wt 51 kg   SpO2 96%   BMI 19.30 kg/m  Physical Exam Vitals and nursing note reviewed.  Constitutional:      General: She is not in acute distress.    Appearance: She is well-developed.  HENT:     Head: Normocephalic and atraumatic.  Eyes:     Conjunctiva/sclera: Conjunctivae  normal.  Cardiovascular:     Rate and Rhythm: Normal rate and regular rhythm.     Heart sounds: No murmur heard. Pulmonary:     Effort: Pulmonary effort is normal. No respiratory distress.     Breath sounds: Normal breath sounds. No decreased breath sounds or wheezing.  Abdominal:     Palpations: Abdomen is soft.     Tenderness: There is no abdominal tenderness.  Musculoskeletal:        General: No swelling.     Cervical back: Neck supple.     Right lower leg: No tenderness.     Left lower leg: No tenderness.     Comments: Trace edema in bilateral lower extremities  Skin:    General: Skin is warm and dry.     Capillary Refill: Capillary  refill takes less than 2 seconds.     Comments: Some slight erythema noted to the right lower extremity but not particularly hot when compared to left lower extremity  Neurological:     Mental Status: She is alert.  Psychiatric:        Mood and Affect: Mood normal.     ED Results / Procedures / Treatments   Labs (all labs ordered are listed, but only abnormal results are displayed) Labs Reviewed  COMPREHENSIVE METABOLIC PANEL - Abnormal; Notable for the following components:      Result Value   BUN 42 (*)    Creatinine, Ser 1.51 (*)    AST 171 (*)    ALT 136 (*)    Alkaline Phosphatase 174 (*)    GFR, Estimated 33 (*)    All other components within normal limits  CBC WITH DIFFERENTIAL/PLATELET - Abnormal; Notable for the following components:   RBC 3.29 (*)    Hemoglobin 10.7 (*)    HCT 31.7 (*)    Platelets 142 (*)    All other components within normal limits  TROPONIN I (HIGH SENSITIVITY) - Abnormal; Notable for the following components:   Troponin I (High Sensitivity) 21 (*)    All other components within normal limits  URINALYSIS, ROUTINE W REFLEX MICROSCOPIC  BRAIN NATRIURETIC PEPTIDE  TROPONIN I (HIGH SENSITIVITY)    EKG EKG Interpretation  Date/Time:  Friday Apr 20 2023 17:43:23 EDT Ventricular Rate:  61 PR Interval:  151 QRS Duration: 105 QT Interval:  465 QTC Calculation: 469 R Axis:   -45 Text Interpretation: Sinus rhythm Incomplete RBBB and LAFB Low voltage, precordial leads Confirmed by Vivi Barrack [phone]) on 04/20/2023 7:07:48 PM  Radiology DG Chest Portable 1 View  Result Date: 04/20/2023 CLINICAL DATA:  Centralized chest pain.  Headache. EXAM: PORTABLE CHEST 1 VIEW COMPARISON:  09/01/2022. FINDINGS: Mild enlargement of the cardiac silhouette, stable. No mediastinal or hilar masses. Minor linear scarring at the left lung base. Lungs otherwise clear. No convincing pleural effusion and no pneumothorax. Skeletal structures are demineralized, grossly  intact. IMPRESSION: No acute cardiopulmonary disease. Electronically Signed   By: Amie Portland M.D.   On: 04/20/2023 18:51   CT Head Wo Contrast  Result Date: 04/20/2023 CLINICAL DATA:  Headache EXAM: CT HEAD WITHOUT CONTRAST TECHNIQUE: Contiguous axial images were obtained from the base of the skull through the vertex without intravenous contrast. RADIATION DOSE REDUCTION: This exam was performed according to the departmental dose-optimization program which includes automated exposure control, adjustment of the mA and/or kV according to patient size and/or use of iterative reconstruction technique. COMPARISON:  CT Head 01/26/20 FINDINGS: Brain: No evidence of acute infarction, hemorrhage,  extra-axial collection or mass lesion/mass effect. There is sequela of moderate chronic microvascular ischemic change. There is slight disproportionate enlargement of the ventricular system with a slightly acute callosal angle and crowding of the sulci at the vertex. This has slightly progressed compared to 2021 Vascular: No hyperdense vessel or unexpected calcification. Skull: Normal. Negative for fracture or focal lesion. Sinuses/Orbits: No middle ear or mastoid effusion. Frothy secretions in the right maxillary and bilateral sphenoid sinuses as well as an air-fluid level in the left maxillary sinus. Bilateral lens replacement. Orbits are otherwise unremarkable. Other: None. IMPRESSION: 1. No acute intracranial abnormality. 2. Slight disproportionate enlargement of the ventricular system with a slightly acute callosal angle and crowding of the sulci at the vertex. This has slightly progressed compared to 2021 and could represent normal pressure hydrocephalus in the appropriate clinical setting. 3. Frothy secretions in the right maxillary and bilateral sphenoid sinuses as well as an air-fluid level in the left maxillary sinus. Findings can be seen in the setting of acute sinusitis. Electronically Signed   By: Lorenza Cambridge M.D.    On: 04/20/2023 18:51    Procedures Procedures   Medications Ordered in ED Medications  prochlorperazine (COMPAZINE) injection 10 mg (has no administration in time range)  diphenhydrAMINE (BENADRYL) injection 12.5 mg (has no administration in time range)  dexamethasone (DECADRON) injection 10 mg (has no administration in time range)  sodium chloride 0.9 % bolus 500 mL (500 mLs Intravenous New Bag/Given 04/20/23 2058)  HYDROmorphone (DILAUDID) injection 0.5 mg (0.5 mg Intravenous Given 04/20/23 2059)    ED Course/ Medical Decision Making/ A&P Clinical Course as of 04/20/23 2207  Fri Apr 20, 2023  1826 EKG 12-Lead [OZ]    Clinical Course User Index [OZ] Smitty Knudsen, PA-C                           Medical Decision Making Amount and/or Complexity of Data Reviewed Labs: ordered. Radiology: ordered. ECG/medicine tests:  Decision-making details documented in ED Course.  Risk Prescription drug management.   This patient presents to the ED for concern of chest pain and headache. This involves an extensive number of treatment options, and is a complaint that carries with it a high risk of complications and morbidity.  The differential diagnosis includes MI, hemorrhagic stroke, migraine, viral URI   Lab Tests:  I Ordered, and personally interpreted labs.  The pertinent results include: Elevated troponin at 21, CMP with evidence of decreased renal function with elevated BUN and creatinine declining GFR.   Imaging Studies ordered:  I ordered imaging studies including chest x-ray, CT head I independently visualized and interpreted imaging which showed no acute cardiopulmonary process, possible normal pressure hydrocephalus I agree with the radiologist interpretation   Cardiac Monitoring: / EKG:  The patient was maintained on a cardiac monitor.  I personally viewed and interpreted the cardiac monitored which showed an underlying rhythm of: Sinus rhythm   Consultations  Obtained:  I requested consultation with the neurology,  and discussed lab and imaging findings as well as pertinent plan - they recommend: Dr. Iver Nestle, who advised that if there is concern for normal pressure hydrocephalus that this can typically be performed outpatient as long as patient is not having the triad of symptoms which includes gait instability, incontinence, and dementia.  Given the patient is having some memory issues but is otherwise at baseline and not expressing any other deficits, patient would probably benefit more from outpatient neurology  evaluation as recommended by Dr. Iver Nestle.   Problem List / ED Course / Critical interventions / Medication management  Patient presenting to the emergency department with daughter with concerns of chest pain and headache.  Per daughter's report, patient has been somewhat more tired and lethargic over the last day or so with associated headache and chest pain.  Primarily concerned about headache that has not been typical for her.  Some sense of radiation from around the ear towards her scalp.  No rashes noted.  Workup initiated which was positive for an elevated troponin at 21 but compared to troponin 2 years ago, that troponin was at 20 so patient may have a baseline elevation in troponin.  CBC largely unremarkable, CMP with evidence of impaired kidney function.  CT head did show possible signs of normal pressure hydrocephalus but given lack of gait instability, incontinence, I believe that this is unlikely and after discussing with Dr. Iver Nestle with neurology, outpatient evaluation is recommended for this. Given findings of CT head, CT angio head and neck ordered for further evaluation of possible cause of headache.  CT tech informed me that patient's daughter was declining this assessment as she would prefer that patient receive comfort care for the headache instead of further imaging and potential workup or intervention for any abnormality seen on CT  angiogram. I ordered medication including fluids, Dilaudid, Decadron, Benadryl, Compazine for headache Reevaluation of the patient after these medicines showed that the patient improved I have reviewed the patients home medicines and have made adjustments as needed  10:07 PM Care of Charlene Garza transferred to [facility] and Dr. Jearld Fenton at the end of my shift as the patient will require reassessment once labs/imaging have resulted. Patient presentation, ED course, and plan of care discussed with review of all pertinent labs and imaging. Please see his/her note for further details regarding further ED course and disposition. Plan at time of handoff is discuss plans of care and reassess for headache control with migraine cocktail. Patient's daughter at this time is refusing CT angio for patient. This may be altered or completely changed at the discretion of the oncoming team pending results of further workup.  Final Clinical Impression(s) / ED Diagnoses Final diagnoses:  None    Rx / DC Orders ED Discharge Orders     None         Smitty Knudsen, PA-C 04/20/23 2206    Smitty Knudsen, PA-C 04/20/23 2207    Loetta Rough, MD 05/09/23 [phone]

## 2023-04-20 NOTE — ED Triage Notes (Signed)
C/o of centralized cp with headache x 1 day.  Swelling noted to bilateral lower extremities Family reports hx CHF.  Denies sob.  Denies cp at this time Hx dementia.  Pt on eliquis.

## 2023-04-21 ENCOUNTER — Observation Stay (HOSPITAL_COMMUNITY): Payer: Medicare Other

## 2023-04-21 ENCOUNTER — Encounter (HOSPITAL_COMMUNITY): Payer: Self-pay

## 2023-04-21 DIAGNOSIS — R079 Chest pain, unspecified: Secondary | ICD-10-CM | POA: Diagnosis present

## 2023-04-21 DIAGNOSIS — R519 Headache, unspecified: Secondary | ICD-10-CM | POA: Diagnosis present

## 2023-04-21 DIAGNOSIS — D649 Anemia, unspecified: Secondary | ICD-10-CM | POA: Insufficient documentation

## 2023-04-21 DIAGNOSIS — D61818 Other pancytopenia: Secondary | ICD-10-CM | POA: Diagnosis present

## 2023-04-21 LAB — CBC WITH DIFFERENTIAL/PLATELET
Abs Immature Granulocytes: 0.02 10*3/uL (ref 0.00–0.07)
Basophils Absolute: 0 10*3/uL (ref 0.0–0.1)
Basophils Relative: 1 %
Eosinophils Absolute: 0 10*3/uL (ref 0.0–0.5)
Eosinophils Relative: 0 %
HCT: 29.1 % — ABNORMAL LOW (ref 36.0–46.0)
Hemoglobin: 9.9 g/dL — ABNORMAL LOW (ref 12.0–15.0)
Immature Granulocytes: 1 %
Lymphocytes Relative: 15 %
Lymphs Abs: 0.6 10*3/uL — ABNORMAL LOW (ref 0.7–4.0)
MCH: 32.6 pg (ref 26.0–34.0)
MCHC: 34 g/dL (ref 30.0–36.0)
MCV: 95.7 fL (ref 80.0–100.0)
Monocytes Absolute: 0.1 10*3/uL (ref 0.1–1.0)
Monocytes Relative: 2 %
Neutro Abs: 3.2 10*3/uL (ref 1.7–7.7)
Neutrophils Relative %: 81 %
Platelets: 146 10*3/uL — ABNORMAL LOW (ref 150–400)
RBC: 3.04 MIL/uL — ABNORMAL LOW (ref 3.87–5.11)
RDW: 12.9 % (ref 11.5–15.5)
WBC: 3.9 10*3/uL — ABNORMAL LOW (ref 4.0–10.5)
nRBC: 0 % (ref 0.0–0.2)

## 2023-04-21 LAB — COMPREHENSIVE METABOLIC PANEL
ALT: 120 U/L — ABNORMAL HIGH (ref 0–44)
AST: 139 U/L — ABNORMAL HIGH (ref 15–41)
Albumin: 3.5 g/dL (ref 3.5–5.0)
Alkaline Phosphatase: 150 U/L — ABNORMAL HIGH (ref 38–126)
Anion gap: 10 (ref 5–15)
BUN: 35 mg/dL — ABNORMAL HIGH (ref 8–23)
CO2: 22 mmol/L (ref 22–32)
Calcium: 8.6 mg/dL — ABNORMAL LOW (ref 8.9–10.3)
Chloride: 103 mmol/L (ref 98–111)
Creatinine, Ser: 1.26 mg/dL — ABNORMAL HIGH (ref 0.44–1.00)
GFR, Estimated: 41 mL/min — ABNORMAL LOW (ref >60)
Glucose, Bld: 139 mg/dL — ABNORMAL HIGH (ref 70–99)
Potassium: 4.6 mmol/L (ref 3.5–5.1)
Sodium: 135 mmol/L (ref 135–145)
Total Bilirubin: 0.7 mg/dL (ref 0.3–1.2)
Total Protein: 6.7 g/dL (ref 6.5–8.1)

## 2023-04-21 LAB — MAGNESIUM: Magnesium: 1.9 mg/dL (ref 1.7–2.4)

## 2023-04-21 LAB — TROPONIN I (HIGH SENSITIVITY): Troponin I (High Sensitivity): 29 ng/L — ABNORMAL HIGH (ref <18)

## 2023-04-21 MED ORDER — AMOXICILLIN-POT CLAVULANATE 500-125 MG PO TABS
1.0000 | ORAL_TABLET | Freq: Two times a day (BID) | ORAL | Status: DC
  Administered 2023-04-21 – 2023-04-22 (×3): 1 via ORAL
  Filled 2023-04-21 (×3): qty 1

## 2023-04-21 MED ORDER — ENSURE ENLIVE PO LIQD
237.0000 mL | Freq: Two times a day (BID) | ORAL | Status: DC
  Administered 2023-04-21 – 2023-04-22 (×3): 237 mL via ORAL

## 2023-04-21 MED ORDER — ONDANSETRON HCL 4 MG/2ML IJ SOLN: 4.0000 mg | Freq: Four times a day (QID) | INTRAMUSCULAR | Status: DC | PRN

## 2023-04-21 MED ORDER — IRBESARTAN 75 MG PO TABS
75.0000 mg | ORAL_TABLET | Freq: Every day | ORAL | Status: DC
  Administered 2023-04-21 – 2023-04-22 (×2): 75 mg via ORAL
  Filled 2023-04-21 (×2): qty 1

## 2023-04-21 MED ORDER — MIRTAZAPINE 7.5 MG PO TABS
7.5000 mg | ORAL_TABLET | Freq: Every day | ORAL | Status: DC
  Administered 2023-04-21: 7.5 mg via ORAL
  Filled 2023-04-21: qty 1

## 2023-04-21 MED ORDER — GABAPENTIN 100 MG PO CAPS
100.0000 mg | ORAL_CAPSULE | Freq: Two times a day (BID) | ORAL | Status: DC
  Administered 2023-04-21 – 2023-04-22 (×3): 100 mg via ORAL
  Filled 2023-04-21 (×3): qty 1

## 2023-04-21 MED ORDER — SODIUM CHLORIDE 0.9 % IV SOLN
100.0000 mg | Freq: Two times a day (BID) | INTRAVENOUS | Status: DC
  Administered 2023-04-21: 100 mg via INTRAVENOUS
  Filled 2023-04-21: qty 100

## 2023-04-21 MED ORDER — HYDROCODONE-ACETAMINOPHEN 5-325 MG PO TABS
1.0000 | ORAL_TABLET | Freq: Four times a day (QID) | ORAL | Status: DC | PRN
  Administered 2023-04-21: 1 via ORAL
  Filled 2023-04-21: qty 1

## 2023-04-21 MED ORDER — KETOROLAC TROMETHAMINE 15 MG/ML IJ SOLN
15.0000 mg | Freq: Once | INTRAMUSCULAR | Status: AC | PRN
  Administered 2023-04-21: 15 mg via INTRAVENOUS
  Filled 2023-04-21: qty 1

## 2023-04-21 MED ORDER — APIXABAN 2.5 MG PO TABS
2.5000 mg | ORAL_TABLET | Freq: Two times a day (BID) | ORAL | Status: DC
  Administered 2023-04-21 – 2023-04-22 (×3): 2.5 mg via ORAL
  Filled 2023-04-21 (×3): qty 1

## 2023-04-21 MED ORDER — MEMANTINE HCL 5 MG PO TABS
5.0000 mg | ORAL_TABLET | Freq: Two times a day (BID) | ORAL | Status: DC
  Administered 2023-04-21 – 2023-04-22 (×3): 5 mg via ORAL
  Filled 2023-04-21 (×3): qty 1

## 2023-04-21 MED ORDER — ACETAMINOPHEN 325 MG PO TABS: 650.0000 mg | ORAL_TABLET | Freq: Four times a day (QID) | ORAL | Status: DC | PRN

## 2023-04-21 MED ORDER — MELATONIN 3 MG PO TABS
3.0000 mg | ORAL_TABLET | Freq: Every evening | ORAL | Status: DC | PRN
  Administered 2023-04-21: 3 mg via ORAL
  Filled 2023-04-21: qty 1

## 2023-04-21 MED ORDER — QUETIAPINE FUMARATE 25 MG PO TABS
12.5000 mg | ORAL_TABLET | Freq: Every day | ORAL | Status: DC
  Administered 2023-04-21: 12.5 mg via ORAL
  Filled 2023-04-21: qty 1

## 2023-04-21 MED ORDER — ACETAMINOPHEN 650 MG RE SUPP: 650.0000 mg | Freq: Four times a day (QID) | RECTAL | Status: DC | PRN

## 2023-04-21 NOTE — Progress Notes (Unsigned)
Attempted Echo. Patient stated she did not want the test; there was nothing wrong with her heart. She appeared agitated and confused.   Dondra Prader RVT RCS

## 2023-04-21 NOTE — Progress Notes (Signed)
Patient refused abdominal ultrasound and telemetry monitoring. Daughter, Carollee Herter is on her way. Will speak with her.

## 2023-04-21 NOTE — Progress Notes (Signed)
  Carryover admission to the Day Admitter.  I discussed this case with the EDP, Roxy Horseman, PA.  Per these discussions:  This is a 89 year old female who is being admitted for overnight observation for 1 day of intermittent chest pain.  She is also been experiencing a headache over that timeframe, with CT head showing findings that could be consistent with NPH, as well as demonstrating evidence of acute sinusitis, with the latter, felt to potentially be a contributing factor leading to her headache.  Per the patient's daughter, present at bedside, the patient would not want further diagnostic evaluation or treatment of potential NPH.   EKG reportedly shows no evidence of acute ischemic changes.  Chest x-ray shows no evidence of acute cardiopulmonary process.  Initial troponin 21, with repeat value trending down slightly to 20.  Patient chest pain-free in the ED.  Headache has been refractory to IV Dilaudid in the ED.  I have placed an order for overnight observation to med/tele for further evaluation management of the above.  I have placed some additional preliminary admit orders via the adult multi-morbid admission order set. I have also ordered repeat troponin in the morning as well as echocardiogram.  For her headache I ordered a single prn dose of Toradol as well as prn Norco.  Given potential contribution towards headache from acute sinusitis I also started her on doxycycline.  I have ordered morning labs in the form of CMP, CBC and serum magnesium level.    Newton Pigg, DO Hospitalist

## 2023-04-21 NOTE — Progress Notes (Signed)
Patient awake and oriented to self only, not restless or agitated. Pulled out her IV. Tried to put a new IV but patient refused. Currently not on IV fluids and IV medications. On call A. Virgel Manifold, NP informed. Will continue to monitor and reassess for IV needs.

## 2023-04-21 NOTE — Progress Notes (Signed)
Daughter, Carollee Herter @bedside . Plan of care discussed.

## 2023-04-21 NOTE — Progress Notes (Signed)
Patient refuses lab draw. Daughter at bedside unable to convince patient's compliance.

## 2023-04-21 NOTE — H&P (Signed)
History and Physical    Patient: Charlene Garza ZOX:[id number] DOB: [birth year] DOA: 04/20/2023 DOS: the patient was seen and examined on 04/21/2023 PCP: Ashley Royalty, NP  Patient coming from: Home  Chief Complaint:  Chief Complaint  Patient presents with   Chest Pain   Headache   HPI: Charlene Garza is a 89 y.o. female with medical history significant of normocytic anemia, osteoarthritis, hyperlipidemia, hypertension, osteopenia, renal insult efficiency, dementia, paroxysmal atrial fibrillation neuro on apixaban who was brought to the emergency department due to chest pain and headache since yesterday.  She is very confused and unable to elaborate about her HPI at this moment.  She allowed me to examine her, but stated there was nothing wrong with her.  She answers simple questions.  She denied headache, chest, abdominal or back pain.  Lab work: CBC showed white count 4.5, hemoglobin 10.7 g/dL platelets 811.  Troponin was 20 then 29 ng/L.  BNP 340.0 pg/mL.  CMP showed a BUN of 42 and creatinine of 1.51 mg deciliter.  AST 871, ALT 836 and alkaline phosphatase 174.  The rest of the CMP measurements were normal.  Imaging: Portable 1 view chest radiograph with no acute cardiopulmonary pathology.  CT head without contrast with no acute intracranial abnormality.  There was question of possible NPH due to increased size in the ventricular system.  The patient and daughter stated earlier that they would not like to proceed with further diagnostic evaluation or treatment of potential NPH.  ED course: Initial vital signs were temperature 98.5 F, pulse 63, respiration 14, BP 157/89 mmHg and O2 sat 97% on room air.  The patient received ketorolac 15 mg IVP, prochlorperazine 10 mg IVP, Normal Saline 500 mL bolus, hydromorphone 0.5 mg IVP x 1, diphenhydramine 12.5 mg IVP x 1 and desoximetasone 10 mg IVP x 1.   Review of Systems: As mentioned in the history of present illness. All other systems  reviewed and are negative. Past Medical History:  Diagnosis Date   Anemia    Arthritis    Hyperlipidemia    Hypertension    Osteopenia    T score : - 2.4 @ R femoral neck   Renal insufficiency    Past Surgical History:  Procedure Laterality Date   CATARACT EXTRACTION     bilaterally   CHOLECYSTECTOMY  10/17/2012   Procedure: LAPAROSCOPIC CHOLECYSTECTOMY WITH INTRAOPERATIVE CHOLANGIOGRAM;  Surgeon: Axel Filler, MD;  Location: [facility];  Service: General;  Laterality: N/A;   FEMUR IM NAIL Right 01/27/2020   Procedure: INTRAMEDULLARY (IM) NAIL FEMORAL;  Surgeon: Durene Romans, MD;  Location: [facility];  Service: Orthopedics;  Laterality: Right;   KNEE SURGERY  10/2003   L TKR   Social History:  reports that she has never smoked. She has never used smokeless tobacco. She reports current alcohol use of about 7.0 standard drinks of alcohol per week. She reports that she does not use drugs.  Allergies  Allergen Reactions   Ambien Cr [Zolpidem Tartrate Er] Other (See Comments)    Mental status changes postoperatively   Mevacor [Lovastatin] Other (See Comments)    10/02/13 CK 276    Family History  Problem Relation Age of Onset   Hypertension Father    Hypertension Mother    Heart attack Mother 33   Hypertension Brother    Diabetes Neg Hx    Stroke Neg Hx     Prior to Admission medications   Medication Sig Start Date End Date Taking? Authorizing Provider  acetaminophen (TYLENOL) 500 MG tablet Take 500 mg by mouth daily.   Yes [provider]  ALPRAZolam (XANAX) 0.25 MG tablet Take 0.125-0.25 mg by mouth See admin instructions. Take 0.125 mg by mouth in the morning and 0.25 mg at bedtime 10/26/22  Yes [provider]  amoxicillin (AMOXIL) 500 MG capsule Take 500 mg by mouth every 12 (twelve) hours. 04/16/23 04/22/23 Yes [provider]  apixaban (ELIQUIS) 2.5 MG TABS tablet Take 1 tablet (2.5 mg total) by mouth 2 (two) times daily. 10/18/20  Yes Etta Grandchild,  MD  gabapentin (NEURONTIN) 100 MG capsule Take 100 mg by mouth in the morning and at bedtime. 08/20/22  Yes [provider]  GERI-TUSSIN DM 10-100 MG/5ML liquid Take 10 mLs by mouth every 6 (six) hours as needed (for cough and/or congestion).   Yes [provider]  HYDROcodone-acetaminophen (NORCO/VICODIN) 5-325 MG tablet Take 1 tablet by mouth every 6 (six) hours as needed (for pain- do not exceed a total of 4 tablets/24 hours).   Yes [provider]  lactose free nutrition (BOOST) LIQD Take 1 Container by mouth See admin instructions. Drink 1 container/bottle of VANILLA Boost once a day at noon   Yes [provider]  memantine (NAMENDA) 5 MG tablet Take 5 mg by mouth in the morning and at bedtime. 08/20/22  Yes [provider]  mirtazapine (REMERON) 7.5 MG tablet Take 7.5 mg by mouth at bedtime. 08/20/22  Yes [provider]  Multiple Vitamins-Minerals (CENTRUM SILVER 50+WOMEN PO) Take 1 tablet by mouth at bedtime.   Yes [provider]  QUEtiapine (SEROQUEL) 25 MG tablet Take 12.5 mg by mouth at bedtime.   Yes [provider]  valsartan (DIOVAN) 80 MG tablet Take 80 mg by mouth daily.   Yes [provider]  memantine (NAMENDA) 10 MG tablet Take 1 tablet (10 mg total) by mouth daily. Patient not taking: Reported on 04/21/2023 02/27/20   Myrlene Broker, MD    Physical Exam: Vitals:   04/21/23 0300 04/21/23 0500 04/21/23 0515 04/21/23 0528  BP: (!) 151/88 139/85    Pulse: (!) 57 (!) 50 (!) 57   Resp: 16 15 (!) 22   Temp:    (!) 97.5 F (36.4 C)  TempSrc:    Oral  SpO2: 97% 97% 99%   Weight:       Physical Exam Constitutional:      Appearance: She is normal weight.  HENT:     Head: Normocephalic.     Nose: No rhinorrhea.     Mouth/Throat:     Mouth: Mucous membranes are moist.  Eyes:     General: No scleral icterus.    Pupils: Pupils are equal, round, and reactive to light.  Neck:     Vascular: No JVD.   Cardiovascular:     Rate and Rhythm: Normal rate and regular rhythm.     Heart sounds: S1 normal and S2 normal.  Pulmonary:     Effort: Pulmonary effort is normal.     Breath sounds: No wheezing, rhonchi or rales.  Abdominal:     General: Bowel sounds are normal.     Palpations: Abdomen is soft.     Tenderness: There is no abdominal tenderness.  Musculoskeletal:     Cervical back: Neck supple.     Right lower leg: 1+ Edema present.     Left lower leg: 1+ Edema present.  Skin:    General: Skin is warm and dry.  Findings: Erythema present.     Comments: RLE lower erythema in ankle area.  Neurological:     General: No focal deficit present.     Mental Status: She is alert. She is disoriented.  Psychiatric:        Attention and Perception: She is inattentive.        Mood and Affect: Affect is angry.        Cognition and Memory: Memory is impaired.     Data Reviewed:  Results are pending, will review when available.  01/29/2020 TTE IMPRESSIONS:   1. Left ventricular ejection fraction, by estimation, is 60 to 65%. The  left ventricle has normal function. The left ventrical has no regional  wall motion abnormalities. Left ventricular diastolic parameters are  indeterminate.   2. Right ventricular systolic function is normal. The right ventricular  size is normal.   3. Left atrial size was mildly dilated.   4. Right atrial size was mildly dilated.   5. The mitral valve is normal in structure and function. Mild mitral  valve regurgitation.   6. The aortic valve is tricuspid. Aortic valve regurgitation is trivial.   EKG: Vent. rate 61 BPM PR interval 151 ms QRS duration 105 ms QT/QTcB 465/469 ms P-R-T axes -12 -45 54 Sinus rhythm Incomplete RBBB and LAFB Low voltage, precordial leads  Assessment and Plan: Principal Problem:   Chest pain Observation/telemetry. Analgesics as needed. Antiemetics as needed. Obtain echocardiogram. (Patient declined echocardiogram  earlier). Try to obtain when family members this present.  Active Problems:   Headache  Likely due to acute sinusitis. Was on amoxicillin at home. Will switch to Augmentin twice daily.    Hyperlipidemia   Essential hypertension Continue valsartan 80 mg p.o. daily or formulary equivalent.    Transaminitis Declining RUQ [place].    Dementia (HCC) Supportive care. Reorient as needed. Continue Namenda 5 mg p.o. twice daily. Continue Remeron 7.5 mg p.o. bedtime. Continue Seroquel 12.5 mg p.o. bedtime.    CKD (chronic kidney disease) stage 3b Monitor renal function and electrolytes.    Persistent atrial fibrillation (HCC) CHA2DS2-VASc Score of at least 5. Continue apixaban 2.5 mg p.o. twice daily. She is not currently on rate control meds.    Pancytopenia (HCC) Follow-up CBC in AM. IP or OP hematology evaluation.     Advance Care Planning:   Code Status: DNR   Consults:   Family Communication:   Severity of Illness: The appropriate patient status for this patient is OBSERVATION. Observation status is judged to be reasonable and necessary in order to provide the required intensity of service to ensure the patient's safety. The patient's presenting symptoms, physical exam findings, and initial radiographic and laboratory data in the context of their medical condition is felt to place them at decreased risk for further clinical deterioration. Furthermore, it is anticipated that the patient will be medically stable for discharge from the hospital within 2 midnights of admission.   Author: Bobette Mo, MD 04/21/2023 7:56 AM  For on call review [email or web address].   This document was prepared using Dragon voice recognition software and may contain some unintended transcription errors.

## 2023-04-21 NOTE — ED Notes (Signed)
ED TO INPATIENT HANDOFF REPORT  ED Nurse Name and Phone #: Huntley Dec [phone]  S Name/Age/Gender Charlene Garza 89 y.o. female Room/Bed: WA02/WA02  Code Status   Code Status: DNR  Home/SNF/Other Patient oriented to: self Is this baseline? Yes   Triage Complete: Triage complete  Chief Complaint Chest pain [R07.9]  Triage Note C/o of centralized cp with headache x 1 day.  Swelling noted to bilateral lower extremities Family reports hx CHF.  Denies sob.  Denies cp at this time Hx dementia.  Pt on eliquis.    Allergies Allergies  Allergen Reactions   Ambien Cr [Zolpidem Tartrate Er] Other (See Comments)    Mental status changes postoperatively   Mevacor [Lovastatin] Other (See Comments)    10/02/13 CK 276    Level of Care/Admitting Diagnosis ED Disposition     ED Disposition  Admit   Condition  --   Comment  Hospital Area: [facility] [[id number]]  Level of Care: Telemetry [5]  Admit to tele based on following criteria: Monitor for Ischemic changes  May place patient in observation at [facility] or [facility] if equivalent level of care is available:: No  Covid Evaluation: Asymptomatic - no recent exposure (last 10 days) testing not required  Diagnosis: Chest pain [454098]  Admitting Physician: Angie Fava [[id number]]  Attending Physician: Angie Fava [[id number]]          B Medical/Surgery History Past Medical History:  Diagnosis Date   Anemia    Arthritis    Hyperlipidemia    Hypertension    Osteopenia    T score : - 2.4 @ R femoral neck   Renal insufficiency    Past Surgical History:  Procedure Laterality Date   CATARACT EXTRACTION     bilaterally   CHOLECYSTECTOMY  10/17/2012   Procedure: LAPAROSCOPIC CHOLECYSTECTOMY WITH INTRAOPERATIVE CHOLANGIOGRAM;  Surgeon: Axel Filler, MD;  Location: [facility] OR;  Service: General;  Laterality: N/A;   FEMUR IM NAIL Right 01/27/2020   Procedure: INTRAMEDULLARY (IM) NAIL FEMORAL;   Surgeon: Durene Romans, MD;  Location: [facility];  Service: Orthopedics;  Laterality: Right;   KNEE SURGERY  10/2003   L TKR     A IV Location/Drains/Wounds Patient Lines/Drains/Airways Status     Active Line/Drains/Airways     Name Placement date Placement time Site Days   Peripheral IV 04/20/23 22 G Anterior;Right Forearm 04/20/23  1946  Forearm  1   Incision 10/17/12 Abdomen Other (Comment) 10/17/12  1441  -- 3838   Incision (Closed) 01/27/20 Hip 01/27/20  1837  -- 1180   Incision - 4 Ports Abdomen 1: Umbilicus 2: Mid;Upper 3: Right;Lateral;Lower 4: Right;Lateral;Upper 10/17/12  1432  -- 3838            Intake/Output Last 24 hours No intake or output data in the 24 hours ending 04/21/23 0737  Labs/Imaging Results for orders placed or performed during the hospital encounter of 04/20/23 (from the past 48 hour(s))  Comprehensive metabolic panel     Status: Abnormal   Collection Time: 04/20/23  7:46 PM  Result Value Ref Range   Sodium 135 135 - 145 mmol/L   Potassium 4.6 3.5 - 5.1 mmol/L   Chloride 101 98 - 111 mmol/L   CO2 25 22 - 32 mmol/L   Glucose, Bld 94 70 - 99 mg/dL    Comment: Glucose reference range applies only to samples taken after fasting for at least 8 hours.   BUN 42 (H) 8 - 23  mg/dL   Creatinine, Ser 1.61 (H) 0.44 - 1.00 mg/dL   Calcium 9.3 8.9 - 09.6 mg/dL   Total Protein 7.5 6.5 - 8.1 g/dL   Albumin 3.9 3.5 - 5.0 g/dL   AST 045 (H) 15 - 41 U/L   ALT 136 (H) 0 - 44 U/L   Alkaline Phosphatase 174 (H) 38 - 126 U/L   Total Bilirubin 0.6 0.3 - 1.2 mg/dL   GFR, Estimated 33 (L) >60 mL/min    Comment: (NOTE) Calculated using the CKD-EPI Creatinine Equation (2021)    Anion gap 9 5 - 15    Comment: Performed at [facility], [address], [place], [place] [address]  Troponin I (High Sensitivity)     Status: Abnormal   Collection Time: 04/20/23  7:46 PM  Result Value Ref Range   Troponin I (High Sensitivity) 21 (H) <18 ng/L    Comment:  (NOTE) Elevated high sensitivity troponin I (hsTnI) values and significant  changes across serial measurements may suggest ACS but many other  chronic and acute conditions are known to elevate hsTnI results.  Refer to the "Links" section for chest pain algorithms and additional  guidance. Performed at [facility], [address], [place], [place] [address]   CBC with Differential     Status: Abnormal   Collection Time: 04/20/23  7:46 PM  Result Value Ref Range   WBC 4.5 4.0 - 10.5 K/uL    Comment: WHITE COUNT CONFIRMED ON SMEAR   RBC 3.29 (L) 3.87 - 5.11 MIL/uL   Hemoglobin 10.7 (L) 12.0 - 15.0 g/dL   HCT 82.9 (L) 56.2 - 13.0 %   MCV 96.4 80.0 - 100.0 fL   MCH 32.5 26.0 - 34.0 pg   MCHC 33.8 30.0 - 36.0 g/dL   RDW 86.5 78.4 - 69.6 %   Platelets 142 (L) 150 - 400 K/uL    Comment: SPECIMEN CHECKED FOR CLOTS Immature Platelet Fraction may be clinically indicated, consider ordering this additional test [id number] REPEATED TO VERIFY PLATELET COUNT CONFIRMED BY SMEAR    nRBC 0.0 0.0 - 0.2 %   Neutrophils Relative % 61 %   Neutro Abs 2.7 1.7 - 7.7 K/uL   Lymphocytes Relative 22 %   Lymphs Abs 1.0 0.7 - 4.0 K/uL   Monocytes Relative 14 %   Monocytes Absolute 0.6 0.1 - 1.0 K/uL   Eosinophils Relative 2 %   Eosinophils Absolute 0.1 0.0 - 0.5 K/uL   Basophils Relative 1 %   Basophils Absolute 0.1 0.0 - 0.1 K/uL   Immature Granulocytes 0 %   Abs Immature Granulocytes 0.02 0.00 - 0.07 K/uL    Comment: Performed at [facility], [address] [place], [place] [address]  Brain natriuretic peptide     Status: Abnormal   Collection Time: 04/20/23 10:07 PM  Result Value Ref Range   B Natriuretic Peptide 340.0 (H) 0.0 - 100.0 pg/mL    Comment: Performed at [facility], [address], [place], [place] [address]  Troponin I (High Sensitivity)     Status: Abnormal   Collection Time: 04/20/23 10:07 PM  Result Value Ref Range    Troponin I (High Sensitivity) 20 (H) <18 ng/L    Comment: (NOTE) Elevated high sensitivity troponin I (hsTnI) values and significant  changes across serial measurements may suggest ACS but many other  chronic and acute conditions are known to elevate hsTnI results.  Refer to the "Links" section for  chest pain algorithms and additional  guidance. Performed at [facility], [address] [place], [place] [address]   CBC with Differential/Platelet     Status: Abnormal   Collection Time: 04/21/23  5:32 AM  Result Value Ref Range   WBC 3.9 (L) 4.0 - 10.5 K/uL   RBC 3.04 (L) 3.87 - 5.11 MIL/uL   Hemoglobin 9.9 (L) 12.0 - 15.0 g/dL   HCT 60.4 (L) 54.0 - 98.1 %   MCV 95.7 80.0 - 100.0 fL   MCH 32.6 26.0 - 34.0 pg   MCHC 34.0 30.0 - 36.0 g/dL   RDW 19.1 47.8 - 29.5 %   Platelets 146 (L) 150 - 400 K/uL   nRBC 0.0 0.0 - 0.2 %   Neutrophils Relative % 81 %   Neutro Abs 3.2 1.7 - 7.7 K/uL   Lymphocytes Relative 15 %   Lymphs Abs 0.6 (L) 0.7 - 4.0 K/uL   Monocytes Relative 2 %   Monocytes Absolute 0.1 0.1 - 1.0 K/uL   Eosinophils Relative 0 %   Eosinophils Absolute 0.0 0.0 - 0.5 K/uL   Basophils Relative 1 %   Basophils Absolute 0.0 0.0 - 0.1 K/uL   Immature Granulocytes 1 %   Abs Immature Granulocytes 0.02 0.00 - 0.07 K/uL    Comment: Performed at [facility], [address], [place], [place] [address]  Comprehensive metabolic panel     Status: Abnormal   Collection Time: 04/21/23  5:32 AM  Result Value Ref Range   Sodium 135 135 - 145 mmol/L   Potassium 4.6 3.5 - 5.1 mmol/L   Chloride 103 98 - 111 mmol/L   CO2 22 22 - 32 mmol/L   Glucose, Bld 139 (H) 70 - 99 mg/dL    Comment: Glucose reference range applies only to samples taken after fasting for at least 8 hours.   BUN 35 (H) 8 - 23 mg/dL   Creatinine, Ser 8.65 (H) 0.44 - 1.00 mg/dL   Calcium 8.6 (L) 8.9 - 10.3 mg/dL   Total Protein 6.7 6.5 - 8.1 g/dL   Albumin 3.5 3.5 - 5.0 g/dL   AST  784 (H) 15 - 41 U/L   ALT 120 (H) 0 - 44 U/L   Alkaline Phosphatase 150 (H) 38 - 126 U/L   Total Bilirubin 0.7 0.3 - 1.2 mg/dL   GFR, Estimated 41 (L) >60 mL/min    Comment: (NOTE) Calculated using the CKD-EPI Creatinine Equation (2021)    Anion gap 10 5 - 15    Comment: Performed at [facility], [address] [place], [place] [address]  Magnesium     Status: None   Collection Time: 04/21/23  5:32 AM  Result Value Ref Range   Magnesium 1.9 1.7 - 2.4 mg/dL    Comment: Performed at [facility], [address] [place], [place] [address]  Troponin I (High Sensitivity)     Status: Abnormal   Collection Time: 04/21/23  5:32 AM  Result Value Ref Range   Troponin I (High Sensitivity) 29 (H) <18 ng/L    Comment: (NOTE) Elevated high sensitivity troponin I (hsTnI) values and significant  changes across serial measurements may suggest ACS but many other  chronic and acute conditions are known to elevate hsTnI results.  Refer to the "Links" section for chest pain algorithms and additional  guidance. Performed at [facility], [address] [place], [place] [address]    DG Chest  Portable 1 View  Result Date: 04/20/2023 CLINICAL DATA:  Centralized chest pain.  Headache. EXAM: PORTABLE CHEST 1 VIEW COMPARISON:  09/01/2022. FINDINGS: Mild enlargement of the cardiac silhouette, stable. No mediastinal or hilar masses. Minor linear scarring at the left lung base. Lungs otherwise clear. No convincing pleural effusion and no pneumothorax. Skeletal structures are demineralized, grossly intact. IMPRESSION: No acute cardiopulmonary disease. Electronically Signed   By: Amie Portland M.D.   On: 04/20/2023 18:51   CT Head Wo Contrast  Result Date: 04/20/2023 CLINICAL DATA:  Headache EXAM: CT HEAD WITHOUT CONTRAST TECHNIQUE: Contiguous axial images were obtained from the base of the skull through the vertex without intravenous contrast. RADIATION  DOSE REDUCTION: This exam was performed according to the departmental dose-optimization program which includes automated exposure control, adjustment of the mA and/or kV according to patient size and/or use of iterative reconstruction technique. COMPARISON:  CT Head 01/26/20 FINDINGS: Brain: No evidence of acute infarction, hemorrhage, extra-axial collection or mass lesion/mass effect. There is sequela of moderate chronic microvascular ischemic change. There is slight disproportionate enlargement of the ventricular system with a slightly acute callosal angle and crowding of the sulci at the vertex. This has slightly progressed compared to 2021 Vascular: No hyperdense vessel or unexpected calcification. Skull: Normal. Negative for fracture or focal lesion. Sinuses/Orbits: No middle ear or mastoid effusion. Frothy secretions in the right maxillary and bilateral sphenoid sinuses as well as an air-fluid level in the left maxillary sinus. Bilateral lens replacement. Orbits are otherwise unremarkable. Other: None. IMPRESSION: 1. No acute intracranial abnormality. 2. Slight disproportionate enlargement of the ventricular system with a slightly acute callosal angle and crowding of the sulci at the vertex. This has slightly progressed compared to 2021 and could represent normal pressure hydrocephalus in the appropriate clinical setting. 3. Frothy secretions in the right maxillary and bilateral sphenoid sinuses as well as an air-fluid level in the left maxillary sinus. Findings can be seen in the setting of acute sinusitis. Electronically Signed   By: Lorenza Cambridge M.D.   On: 04/20/2023 18:51    Pending Labs Unresulted Labs (From admission, onward)     Start     Ordered   04/20/23 1846  Urinalysis, Routine w reflex microscopic -Urine, Clean Catch  Once,   URGENT       Question Answer Comment  Specimen Source Urine, Clean Catch   Release to patient Immediate      04/20/23 1845            Vitals/Pain Today's  Vitals   04/21/23 0300 04/21/23 0500 04/21/23 0515 04/21/23 0528  BP: (!) 151/88 139/85    Pulse: (!) 57 (!) 50 (!) 57   Resp: 16 15 (!) 22   Temp:    (!) 97.5 F (36.4 C)  TempSrc:    Oral  SpO2: 97% 97% 99%   Weight:      PainSc:        Isolation Precautions No active isolations  Medications Medications  acetaminophen (TYLENOL) tablet 650 mg (has no administration in time range)    Or  acetaminophen (TYLENOL) suppository 650 mg (has no administration in time range)  melatonin tablet 3 mg (has no administration in time range)  ondansetron (ZOFRAN) injection 4 mg (has no administration in time range)  HYDROcodone-acetaminophen (NORCO/VICODIN) 5-325 MG per tablet 1 tablet (has no administration in time range)  ketorolac (TORADOL) 15 MG/ML injection 15 mg (has no administration in time range)  doxycycline (VIBRAMYCIN) 100 mg in sodium chloride  0.9 % 250 mL IVPB (0 mg Intravenous Stopped 04/21/23 0311)  sodium chloride 0.9 % bolus 500 mL (0 mLs Intravenous Stopped 04/20/23 2217)  HYDROmorphone (DILAUDID) injection 0.5 mg (0.5 mg Intravenous Given 04/20/23 2059)  prochlorperazine (COMPAZINE) injection 10 mg (10 mg Intravenous Given 04/20/23 2209)  diphenhydrAMINE (BENADRYL) injection 12.5 mg (12.5 mg Intravenous Given 04/20/23 2212)  dexamethasone (DECADRON) injection 10 mg (10 mg Intravenous Given 04/20/23 2208)    Mobility walks with device     Focused Assessments   R Recommendations: See Admitting Provider Note  Report given to:   Additional Notes:  Daughter refused CTA. Pt DNR

## 2023-04-22 DIAGNOSIS — G4489 Other headache syndrome: Secondary | ICD-10-CM

## 2023-04-22 DIAGNOSIS — G301 Alzheimer's disease with late onset: Secondary | ICD-10-CM

## 2023-04-22 DIAGNOSIS — R0789 Other chest pain: Secondary | ICD-10-CM | POA: Diagnosis not present

## 2023-04-22 DIAGNOSIS — D61818 Other pancytopenia: Secondary | ICD-10-CM | POA: Diagnosis not present

## 2023-04-22 DIAGNOSIS — I10 Essential (primary) hypertension: Secondary | ICD-10-CM

## 2023-04-22 DIAGNOSIS — F02C0 Dementia in other diseases classified elsewhere, severe, without behavioral disturbance, psychotic disturbance, mood disturbance, and anxiety: Secondary | ICD-10-CM

## 2023-04-22 DIAGNOSIS — I4819 Other persistent atrial fibrillation: Secondary | ICD-10-CM

## 2023-04-22 DIAGNOSIS — N1831 Chronic kidney disease, stage 3a: Secondary | ICD-10-CM

## 2023-04-22 DIAGNOSIS — R7401 Elevation of levels of liver transaminase levels: Secondary | ICD-10-CM

## 2023-04-22 DIAGNOSIS — E785 Hyperlipidemia, unspecified: Secondary | ICD-10-CM

## 2023-04-22 MED ORDER — AMOXICILLIN-POT CLAVULANATE 500-125 MG PO TABS: 1.0000 | ORAL_TABLET | Freq: Two times a day (BID) | ORAL | 0 refills | Status: AC

## 2023-04-22 NOTE — Progress Notes (Signed)
  Transition of Care [facility]) Screening Note   Patient Details  Name: Kyrah Pollick Date of Birth: 1930/01/05   Transition of Care [facility]) CM/SW Contact:    Lanier Clam, RN Phone Number: 04/22/2023, 12:21 PM    Transition of Care Department [facility]) has reviewed patient and no TOC needs have been identified at this time. We will continue to monitor patient advancement through interdisciplinary progression rounds. If new patient transition needs arise, please place a TOC consult.

## 2023-04-22 NOTE — [place] FL2 (Signed)
[place] MEDICAID FL2 LEVEL OF CARE FORM     IDENTIFICATION  Patient Name: Charlene Garza Birthdate: 11/20/1930 Sex: female Admission Date (Current Location): 04/20/2023  [facility] and [place] Number:  Producer, television/film/video and Address:  [facility],  [address] [place], [place] [address]      Provider Number: [id number]  Attending Physician Name and Address:  Azucena Fallen, MD  Relative Name and Phone Number:  Carollee Herter Moore(dtr) [phone]    Current Level of Care: Hospital Recommended Level of Care: Assisted Living Facility Prior Approval Number:    Date Approved/Denied:   PASRR Number: [id number]  Discharge Plan: Other (Comment) (ALF)    Current Diagnoses: Patient Active Problem List   Diagnosis Date Noted   Chest pain 04/21/2023   Normocytic anemia 04/21/2023   Pancytopenia (HCC) 04/21/2023   Headache 04/21/2023   Persistent atrial fibrillation (HCC) 10/13/2020   Low back pain 03/24/2020   Hip fracture (HCC) 01/27/2020   Dementia (HCC) 01/27/2020   CKD (chronic kidney disease) stage 3, GFR 30-59 ml/min (HCC) 01/27/2020   Hearing loss due to cerumen impaction, bilateral 09/05/2019   Pain of left hand 11/29/2018   Routine general medical examination at a health care facility 01/25/2018   Osteoarthritis 01/25/2018   Venous insufficiency 07/17/2017   Transaminitis 10/15/2012   Vitamin D deficiency 04/30/2008   Hyperlipidemia 04/30/2008   Essential hypertension 05/09/2007    Orientation RESPIRATION BLADDER Height & Weight     Self  Normal Incontinent Weight: 45.4 kg Height:  5\' 3"  (160 cm)  BEHAVIORAL SYMPTOMS/MOOD NEUROLOGICAL BOWEL NUTRITION STATUS      Incontinent Diet (Heart Healthy)  AMBULATORY STATUS COMMUNICATION OF NEEDS Skin   Extensive Assist Verbally Normal                       Personal Care Assistance Level of Assistance  Bathing, Feeding, Dressing Bathing Assistance: Limited assistance Feeding assistance: Limited  assistance Dressing Assistance: Maximum assistance     Functional Limitations Info  Sight, Hearing, Speech Sight Info: Adequate Hearing Info: Adequate Speech Info: Adequate    SPECIAL CARE FACTORS FREQUENCY  PT (By licensed PT), OT (By licensed OT)     PT Frequency: 2x week OT Frequency: 2x week            Contractures Contractures Info: Not present    Additional Factors Info  Code Status, Allergies Code Status Info: DNR Allergies Info: Ambien Cr (Zolpidem Tartrate Er), Mevacor (Lovastatin)           Current Medications (04/22/2023):  This is the current hospital active medication list Current Facility-Administered Medications  Medication Dose Route Frequency Provider Last Rate Last Admin   acetaminophen (TYLENOL) tablet 650 mg  650 mg Oral Q6H PRN Bobette Mo, MD       Or   acetaminophen (TYLENOL) suppository 650 mg  650 mg Rectal Q6H PRN Bobette Mo, MD       amoxicillin-clavulanate (AUGMENTIN) 500-125 MG per tablet 1 tablet  1 tablet Oral BID Bobette Mo, MD   1 tablet at 04/21/23 2107   apixaban (ELIQUIS) tablet 2.5 mg  2.5 mg Oral BID Bobette Mo, MD   2.5 mg at 04/21/23 2107   feeding supplement (ENSURE ENLIVE / ENSURE PLUS) liquid 237 mL  237 mL Oral BID BM Bobette Mo, MD   237 mL at 04/22/23 1146   gabapentin (NEURONTIN) capsule 100 mg  100 mg Oral BID Robb Matar,  Ernestene Kiel, MD   100 mg at 04/21/23 2107   HYDROcodone-acetaminophen (NORCO/VICODIN) 5-325 MG per tablet 1 tablet  1 tablet Oral Q6H PRN Bobette Mo, MD   1 tablet at 04/21/23 1321   irbesartan (AVAPRO) tablet 75 mg  75 mg Oral Daily Bobette Mo, MD   75 mg at 04/21/23 1032   melatonin tablet 3 mg  3 mg Oral QHS PRN Bobette Mo, MD   3 mg at 04/21/23 2106   memantine (NAMENDA) tablet 5 mg  5 mg Oral BID Bobette Mo, MD   5 mg at 04/21/23 2107   mirtazapine (REMERON) tablet 7.5 mg  7.5 mg Oral QHS Bobette Mo, MD   7.5 mg at  04/21/23 2107   ondansetron [facility]) injection 4 mg  4 mg Intravenous Q6H PRN Bobette Mo, MD       QUEtiapine (SEROQUEL) tablet 12.5 mg  12.5 mg Oral QHS Bobette Mo, MD   12.5 mg at 04/21/23 2107     Discharge Medications: Please see discharge summary for a list of discharge medications.  Relevant Imaging Results:  Relevant Lab Results:   Additional Information SS#[id number]  Lanier Clam, RN

## 2023-04-22 NOTE — TOC Transition Note (Addendum)
Transition of Care [facility]) - CM/SW Discharge Note   Patient Details  Name: Charlene Garza MRN: [id number] Date of Birth: Jan 06, 1930  Transition [facility] CM/SW Contact:  Lanier Clam, RN Phone Number: 04/22/2023, 12:40 PM   Clinical Narrative: Per dtr Carollee Herter for d/c back to [facility]-ALF-has own transport back to same rm#[id number] to be faxed.No further CM needs.   -1:33p-Faxed w/confirmation to [facility] fax#[phone].No further CM needs.    Final next level of care: Assisted Living Barriers to Discharge: No Barriers Identified   Patient Goals and CMS Choice      Discharge Placement                         Discharge Plan and Services Additional resources added to the After Visit Summary for                                       Social Determinants of Health (SDOH) Interventions SDOH Screenings   Food Insecurity: No Food Insecurity (04/21/2023)  Housing: Low Risk  (04/21/2023)  Transportation Needs: No Transportation Needs (04/21/2023)  Utilities: Not At Risk (04/21/2023)  Depression (PHQ2-9): Low Risk  (11/26/2020)  Tobacco Use: Low Risk  (04/20/2023)     Readmission Risk Interventions     No data to display

## 2023-04-22 NOTE — Plan of Care (Signed)
  Problem: Clinical Measurements: Goal: Ability to maintain clinical measurements within normal limits will improve Outcome: Progressing   Problem: Safety: Goal: Ability to remain free from injury will improve Outcome: Progressing   

## 2023-04-22 NOTE — Progress Notes (Signed)
Facility Care Coordinator informed that pt has been d/c back to the facility room 212. Daughter Carollee Herter, will provide transport  back to facility.

## 2023-04-22 NOTE — Discharge Summary (Signed)
Physician Discharge Summary  Charlene Garza JJO:[id number] DOB: 1930-07-06 DOA: 04/20/2023  PCP: Ashley Royalty, NP  Admit date: 04/20/2023 Discharge date: 04/22/2023  Admitted From: Facility Disposition: Same  Recommendations for Outpatient Follow-up:  Follow up with PCP within the next week, daughter indicates there is a visit scheduled for 04/24/2023  Home Health: None Equipment/Devices: None  Discharge Condition: Stable CODE STATUS: DNR Diet recommendation: As tolerated  Brief/Interim Summary: Charlene Garza is a 89 y.o. female with medical history significant of normocytic anemia, osteoarthritis, hyperlipidemia, hypertension, osteopenia, renal insult efficiency, dementia, paroxysmal atrial fibrillation neuro on apixaban who was brought to the emergency department due to reported chest pain and headache for 24 hours.    Patient admitted as above with outpatient complaints of chest pain and headache.  Troponin flat otherwise unremarkable, singularly elevated BNP but patient refusing echocardiogram which is reasonable given her age and will likely not change management at this time.  Patient's headache was likely secondary to sinusitis which she is currently being treated with amoxicillin, broadened to Augmentin in the hospital with marked improvement in symptoms.  Patient otherwise refusing labs, lengthy discussion with daughter about goals of care.  Would recommend further discussion with primary care team about discussion with palliative care given patient's acute transient symptoms with refusal of care and limited intervention given her advanced age it would not be unreasonable to transition patient towards palliative care to further limit future hospitalizations.  Discharge Diagnoses:  Principal Problem:   Chest pain Active Problems:   Hyperlipidemia   Essential hypertension   Transaminitis   Dementia (HCC)   CKD (chronic kidney disease) stage 3, GFR 30-59 ml/min (HCC)    Persistent atrial fibrillation (HCC)   Pancytopenia (HCC)   Headache  Atypical chest pain  -Patient is unable to provide appropriate history given advanced dementia, unclear if pleuritic musculoskeletal or GI related.  Cardiac origin appears to be less likely given unremarkable troponin.  No symptoms since admission per documentation. -Patient refusing labs, echocardiogram -discussed with family, no further indication for intervention or workup, stable for discharge back to facility  Acute sinusitis with intractable headache, POA -Stop amoxicillin, start Augmentin -Supportive care as needed, symptoms resolved overnight   Hyperlipidemia Essential hypertension Continue home medications, no changes   Transaminitis Declining RUQ [place].  Refusing labs(ammonia remains pending)   Profound advanced dementia  Continue Namenda, Remeron, Seroquel per outpatient regimen  CKD (chronic kidney disease) stage 3b Monitor renal function and electrolytes.   Persistent atrial fibrillation (HCC) CHA2DS2-VASc Score of at least 5. Continue apixaban 2.5 mg p.o. twice daily. She is not currently on rate control meds.   Pancytopenia (HCC) Questionably chronic -similar to labs 7 months ago  Discharge Instructions   Allergies as of 04/22/2023       Reactions   Ambien Cr [zolpidem Tartrate Er] Other (See Comments)   Mental status changes postoperatively   Mevacor [lovastatin] Other (See Comments)   10/02/13 CK 276        Medication List     STOP taking these medications    amoxicillin 500 MG capsule Commonly known as: AMOXIL       TAKE these medications    acetaminophen 500 MG tablet Commonly known as: TYLENOL Take 500 mg by mouth daily.   ALPRAZolam 0.25 MG tablet Commonly known as: XANAX Take 0.125-0.25 mg by mouth See admin instructions. Take 0.125 mg by mouth in the morning and 0.25 mg at bedtime   amoxicillin-clavulanate 500-125 MG tablet Commonly known as: AUGMENTIN  Take 1  tablet by mouth 2 (two) times daily.   apixaban 2.5 MG Tabs tablet Commonly known as: ELIQUIS Take 1 tablet (2.5 mg total) by mouth 2 (two) times daily.   CENTRUM SILVER 50+WOMEN PO Take 1 tablet by mouth at bedtime.   gabapentin 100 MG capsule Commonly known as: NEURONTIN Take 100 mg by mouth in the morning and at bedtime.   Geri-Tussin DM 10-100 MG/5ML liquid Generic drug: Dextromethorphan-guaiFENesin Take 10 mLs by mouth every 6 (six) hours as needed (for cough and/or congestion).   HYDROcodone-acetaminophen 5-325 MG tablet Commonly known as: NORCO/VICODIN Take 1 tablet by mouth every 6 (six) hours as needed (for pain- do not exceed a total of 4 tablets/24 hours).   lactose free nutrition Liqd Take 1 Container by mouth See admin instructions. Drink 1 container/bottle of VANILLA Boost once a day at noon   memantine 5 MG tablet Commonly known as: NAMENDA Take 5 mg by mouth in the morning and at bedtime. What changed: Another medication with the same name was removed. Continue taking this medication, and follow the directions you see here.   mirtazapine 7.5 MG tablet Commonly known as: REMERON Take 7.5 mg by mouth at bedtime.   QUEtiapine 25 MG tablet Commonly known as: SEROQUEL Take 12.5 mg by mouth at bedtime.   valsartan 80 MG tablet Commonly known as: DIOVAN Take 80 mg by mouth daily.        Allergies  Allergen Reactions   Ambien Cr [Zolpidem Tartrate Er] Other (See Comments)    Mental status changes postoperatively   Mevacor [Lovastatin] Other (See Comments)    10/02/13 CK 276    Consultations: None  Procedures/Studies: DG Chest Portable 1 View  Result Date: 04/20/2023 CLINICAL DATA:  Centralized chest pain.  Headache. EXAM: PORTABLE CHEST 1 VIEW COMPARISON:  09/01/2022. FINDINGS: Mild enlargement of the cardiac silhouette, stable. No mediastinal or hilar masses. Minor linear scarring at the left lung base. Lungs otherwise clear. No convincing pleural  effusion and no pneumothorax. Skeletal structures are demineralized, grossly intact. IMPRESSION: No acute cardiopulmonary disease. Electronically Signed   By: Amie Portland M.D.   On: 04/20/2023 18:51   CT Head Wo Contrast  Result Date: 04/20/2023 CLINICAL DATA:  Headache EXAM: CT HEAD WITHOUT CONTRAST TECHNIQUE: Contiguous axial images were obtained from the base of the skull through the vertex without intravenous contrast. RADIATION DOSE REDUCTION: This exam was performed according to the departmental dose-optimization program which includes automated exposure control, adjustment of the mA and/or kV according to patient size and/or use of iterative reconstruction technique. COMPARISON:  CT Head 01/26/20 FINDINGS: Brain: No evidence of acute infarction, hemorrhage, extra-axial collection or mass lesion/mass effect. There is sequela of moderate chronic microvascular ischemic change. There is slight disproportionate enlargement of the ventricular system with a slightly acute callosal angle and crowding of the sulci at the vertex. This has slightly progressed compared to 2021 Vascular: No hyperdense vessel or unexpected calcification. Skull: Normal. Negative for fracture or focal lesion. Sinuses/Orbits: No middle ear or mastoid effusion. Frothy secretions in the right maxillary and bilateral sphenoid sinuses as well as an air-fluid level in the left maxillary sinus. Bilateral lens replacement. Orbits are otherwise unremarkable. Other: None. IMPRESSION: 1. No acute intracranial abnormality. 2. Slight disproportionate enlargement of the ventricular system with a slightly acute callosal angle and crowding of the sulci at the vertex. This has slightly progressed compared to 2021 and could represent normal pressure hydrocephalus in the appropriate clinical setting. 3. Frothy  secretions in the right maxillary and bilateral sphenoid sinuses as well as an air-fluid level in the left maxillary sinus. Findings can be seen in  the setting of acute sinusitis. Electronically Signed   By: Lorenza Cambridge M.D.   On: 04/20/2023 18:51     Subjective: No acute issues or events overnight, patient refusing further care, labs, echo.  Denies nausea vomiting diarrhea constipation headache fever chills chest pain or shortness of breath   Discharge Exam: Vitals:   04/21/23 2000 04/22/23 0550  BP: 139/84 (!) 168/81  Pulse: (!) 55 (!) 55  Resp: 20 20  Temp: 98 F (36.7 C) 98.1 F (36.7 C)  SpO2: 98% 97%   Vitals:   04/21/23 1351 04/21/23 2000 04/22/23 0500 04/22/23 0550  BP:  139/84  (!) 168/81  Pulse:  (!) 55  (!) 55  Resp: 15 20  20   Temp:  98 F (36.7 C)  98.1 F (36.7 C)  TempSrc:  Oral  Oral  SpO2:  98%  97%  Weight:   45.4 kg   Height:        General: Pt is alert, awake, not in acute distress Cardiovascular: RRR, S1/S2 +, no rubs, no gallops Respiratory: CTA bilaterally, no wheezing, no rhonchi Abdominal: Soft, NT, ND, bowel sounds + Extremities: no edema, no cyanosis   The results of significant diagnostics from this hospitalization (including imaging, microbiology, ancillary and laboratory) are listed below for reference.     Microbiology: No results found for this or any previous visit (from the past 240 hour(s)).   Labs: BNP (last 3 results) Recent Labs    04/20/23 2207  BNP 340.0*   Basic Metabolic Panel: Recent Labs  Lab 04/20/23 1946 04/21/23 0532  NA 135 135  K 4.6 4.6  CL 101 103  CO2 25 22  GLUCOSE 94 139*  BUN 42* 35*  CREATININE 1.51* 1.26*  CALCIUM 9.3 8.6*  MG  --  1.9   Liver Function Tests: Recent Labs  Lab 04/20/23 1946 04/21/23 0532  AST 171* 139*  ALT 136* 120*  ALKPHOS 174* 150*  BILITOT 0.6 0.7  PROT 7.5 6.7  ALBUMIN 3.9 3.5   No results for input(s): "LIPASE", "AMYLASE" in the last 168 hours. No results for input(s): "AMMONIA" in the last 168 hours. CBC: Recent Labs  Lab 04/20/23 1946 04/21/23 0532  WBC 4.5 3.9*  NEUTROABS 2.7 3.2  HGB 10.7*  9.9*  HCT 31.7* 29.1*  MCV 96.4 95.7  PLT 142* 146*   Cardiac Enzymes: Troponin (Point of Care Test) 21, 20, 29  BNP: 340  Urinalysis    Component Value Date/Time   COLORURINE YELLOW 09/01/2022 1213   APPEARANCEUR HAZY (A) 09/01/2022 1213   LABSPEC 1.009 09/01/2022 1213   PHURINE 6.0 09/01/2022 1213   GLUCOSEU NEGATIVE 09/01/2022 1213   HGBUR NEGATIVE 09/01/2022 1213   BILIRUBINUR NEGATIVE 09/01/2022 1213   BILIRUBINUR neg 11/26/2020 1626   KETONESUR NEGATIVE 09/01/2022 1213   PROTEINUR NEGATIVE 09/01/2022 1213   UROBILINOGEN 0.2 11/26/2020 1626   UROBILINOGEN 0.2 10/18/2012 2032   NITRITE POSITIVE (A) 09/01/2022 1213   LEUKOCYTESUR TRACE (A) 09/01/2022 1213   Sepsis Labs Recent Labs  Lab 04/20/23 1946 04/21/23 0532  WBC 4.5 3.9*   Microbiology No results found for this or any previous visit (from the past 240 hour(s)).   Time coordinating discharge: Over 30 minutes  SIGNED:   Azucena Fallen, DO [facility] 04/22/2023, 11:54 AM Pager   If 7PM-7AM, please contact  night-coverage [email or web address]

## 2023-05-19 DEATH — deceased
